# Patient Record
Sex: Female | Born: 1995
Health system: Southern US, Community
[De-identification: ages and names within clinical notes are randomized; demographics above are authoritative.]

## PROBLEM LIST (undated history)

## (undated) DIAGNOSIS — R011 Cardiac murmur, unspecified: Secondary | ICD-10-CM

## (undated) DIAGNOSIS — I509 Heart failure, unspecified: Secondary | ICD-10-CM

## (undated) DIAGNOSIS — G43909 Migraine, unspecified, not intractable, without status migrainosus: Secondary | ICD-10-CM

## (undated) DIAGNOSIS — F419 Anxiety disorder, unspecified: Secondary | ICD-10-CM

## (undated) DIAGNOSIS — Q231 Congenital insufficiency of aortic valve: Secondary | ICD-10-CM

## (undated) DIAGNOSIS — Q2381 Bicuspid aortic valve: Secondary | ICD-10-CM

## (undated) DIAGNOSIS — Q203 Discordant ventriculoarterial connection: Secondary | ICD-10-CM

## (undated) HISTORY — DX: Congenital insufficiency of aortic valve: Q23.1

## (undated) HISTORY — PX: OTHER SURGICAL HISTORY: SHX169

## (undated) HISTORY — PX: CYST EXCISION: SHX5701

## (undated) HISTORY — DX: Heart failure, unspecified: I50.9

## (undated) HISTORY — DX: Bicuspid aortic valve: Q23.81

## (undated) HISTORY — DX: Presence of prosthetic heart valve: Z95.2

## (undated) HISTORY — DX: Anxiety disorder, unspecified: F41.9

## (undated) HISTORY — DX: Cardiac murmur, unspecified: R01.1

---

## 2015-12-11 ENCOUNTER — Encounter (HOSPITAL_COMMUNITY): Payer: Self-pay | Admitting: Emergency Medicine

## 2015-12-11 ENCOUNTER — Emergency Department (HOSPITAL_COMMUNITY)
Admission: EM | Admit: 2015-12-11 | Discharge: 2015-12-11 | Disposition: A | Discharge status: Home / Self Care | Payer: 59 | Attending: Emergency Medicine | Admitting: Emergency Medicine

## 2015-12-11 DIAGNOSIS — R251 Tremor, unspecified: Secondary | ICD-10-CM | POA: Diagnosis not present

## 2015-12-11 DIAGNOSIS — M546 Pain in thoracic spine: Secondary | ICD-10-CM | POA: Diagnosis not present

## 2015-12-11 DIAGNOSIS — G43909 Migraine, unspecified, not intractable, without status migrainosus: Secondary | ICD-10-CM | POA: Diagnosis not present

## 2015-12-11 DIAGNOSIS — R51 Headache: Secondary | ICD-10-CM | POA: Diagnosis present

## 2015-12-11 DIAGNOSIS — M542 Cervicalgia: Secondary | ICD-10-CM | POA: Diagnosis not present

## 2015-12-11 DIAGNOSIS — R4789 Other speech disturbances: Secondary | ICD-10-CM | POA: Diagnosis not present

## 2015-12-11 DIAGNOSIS — G43109 Migraine with aura, not intractable, without status migrainosus: Secondary | ICD-10-CM

## 2015-12-11 HISTORY — DX: Discordant ventriculoarterial connection: Q20.3

## 2015-12-11 HISTORY — DX: Migraine, unspecified, not intractable, without status migrainosus: G43.909

## 2015-12-11 NOTE — ED Notes (Addendum)
Pt states that tonight she had L sided pain under her eye, R hand cramping, headache and neck pain. States she has a hx of migraines and is seeing 'migraine spots'. Alert and oriented. Neuro intact. Grip strengths equal. Sensation intact.

## 2015-12-11 NOTE — Discharge Instructions (Signed)
Complex (complicated)  Migraine Headache A migraine headache is an intense, throbbing pain on one or both sides of your head. A migraine can last for 30 minutes to several hours. CAUSES  The exact cause of a migraine headache is not always known. However, a migraine may be caused when nerves in the brain become irritated and release chemicals that cause inflammation. This causes pain. Certain things may also trigger migraines, such as:  Alcohol.  Smoking.  Stress.  Menstruation.  Aged cheeses.  Foods or drinks that contain nitrates, glutamate, aspartame, or tyramine.  Lack of sleep.  Chocolate.  Caffeine.  Hunger.  Physical exertion.  Fatigue.  Medicines used to treat chest pain (nitroglycerine), birth control pills, estrogen, and some blood pressure medicines. SIGNS AND SYMPTOMS  Pain on one or both sides of your head.  Pulsating or throbbing pain.  Severe pain that prevents daily activities.  Pain that is aggravated by any physical activity.  Nausea, vomiting, or both.  Dizziness.  Pain with exposure to bright lights, loud noises, or activity.  General sensitivity to bright lights, loud noises, or smells. Before you get a migraine, you may get warning signs that a migraine is coming (aura). An aura may include:  Seeing flashing lights.  Seeing bright spots, halos, or zigzag lines.  Having tunnel vision or blurred vision.  Having feelings of numbness or tingling.  Having trouble talking.  Having muscle weakness. DIAGNOSIS  A migraine headache is often diagnosed based on:  Symptoms.  Physical exam.  A CT scan or MRI of your head. These imaging tests cannot diagnose migraines, but they can help rule out other causes of headaches. TREATMENT Medicines may be given for pain and nausea. Medicines can also be given to help prevent recurrent migraines.  HOME CARE INSTRUCTIONS  Only take over-the-counter or prescription medicines for pain or discomfort  as directed by your health care provider. The use of long-term narcotics is not recommended.  Lie down in a dark, quiet room when you have a migraine.  Keep a journal to find out what may trigger your migraine headaches. For example, write down:  What you eat and drink.  How much sleep you get.  Any change to your diet or medicines.  Limit alcohol consumption.  Quit smoking if you smoke.  Get 7-9 hours of sleep, or as recommended by your health care provider.  Limit stress.  Keep lights dim if bright lights bother you and make your migraines worse. SEEK IMMEDIATE MEDICAL CARE IF:   Your migraine becomes severe.  You have a fever.  You have a stiff neck.  You have vision loss.  You have muscular weakness or loss of muscle control.  You start losing your balance or have trouble walking.  You feel faint or pass out.  You have severe symptoms that are different from your first symptoms. MAKE SURE YOU:   Understand these instructions.  Will watch your condition.  Will get help right away if you are not doing well or get worse.   This information is not intended to replace advice given to you by your health care provider. Make sure you discuss any questions you have with your health care provider.   Document Released: 09/29/2005 Document Revised: 10/20/2014 Document Reviewed: 06/06/2013 Elsevier Interactive Patient Education [employer]

## 2015-12-11 NOTE — ED Notes (Signed)
Pt ambulatory to room from triage without difficulty or assistance; steady gait; pt holding head up looking around; no obvious distress noted

## 2015-12-11 NOTE — ED Provider Notes (Signed)
CSN: [id number]     Arrival date & time 12/11/15  0033 History  By signing my name below, I, Tanda Rockers, attest that this documentation has been prepared under the direction and in the presence of Gilda Crease, MD. Electronically Signed: Tanda Rockers, ED Scribe. 12/11/2015. 3:05 AM.   Chief Complaint  Patient presents with  . Eye Pain  . Headache   The history is provided by the patient. No language interpreter was used.     HPI Comments: Gloria Hamilton is a 20 y.o. female who presents to the Emergency Department complaining of gradual onset, constant, diffuse headache that began around 7:30 PM last night (approximately 7.5 hours ago). Pt reports that prior to her headache beginning she felt bilateral eye pain. Pt also complains of neck pain, upper back pain, right hand cramping, shaking to bilateral hands, difficulty with speaking thinking and concentrating, tingling sensation to the frontal portion of her head, involuntary crying, and hyperventilating. She only currently had a headache and states that all of the other symptoms have subsided since onset. Pt began having splotchy vision while in the ED tonight. She has hx of migraines but states she has never had symptoms like this in the past. Denies weakness, numbness, or any other associated symptoms.   Past Medical History  Diagnosis Date  . Migraines   . Transposition of great vessels    Past Surgical History  Procedure Laterality Date  . Cyst excision      pilonodal  . Ross procedure     No family history on file. Social History  Substance Use Topics  . Smoking status: Never Smoker   . Smokeless tobacco: Not on file  . Alcohol Use: No   OB History    No data available     Review of Systems  Eyes: Positive for pain (bilateral) and visual disturbance (splotchy vision).  Musculoskeletal: Positive for back pain and neck pain.  Neurological: Positive for speech difficulty and headaches. Negative for weakness and  numbness.  All other systems reviewed and are negative.  Allergies  Review of patient's allergies indicates no known allergies.  Home Medications   Prior to Admission medications   Not on File   BP 103/71 mmHg  Pulse 65  Temp(Src) 98.1 F (36.7 C) (Oral)  Resp 18  SpO2 99%  LMP 11/29/2015   Physical Exam  Constitutional: She is oriented to person, place, and time. She appears well-developed and well-nourished. No distress.  HENT:  Head: Normocephalic and atraumatic.  Right Ear: Hearing normal.  Left Ear: Hearing normal.  Nose: Nose normal.  Mouth/Throat: Oropharynx is clear and moist and mucous membranes are normal.  Eyes: Conjunctivae and EOM are normal. Pupils are equal, round, and reactive to light.  Neck: Normal range of motion. Neck supple.  Cardiovascular: Regular rhythm, S1 normal and S2 normal.  Exam reveals no gallop and no friction rub.   No murmur heard. Pulmonary/Chest: Effort normal and breath sounds normal. No respiratory distress. She exhibits no tenderness.  Abdominal: Soft. Normal appearance and bowel sounds are normal. There is no hepatosplenomegaly. There is no tenderness. There is no rebound, no guarding, no tenderness at McBurney's point and negative Murphy's sign. No hernia.  Musculoskeletal: Normal range of motion.  Neurological: She is alert and oriented to person, place, and time. She has normal strength. No cranial nerve deficit or sensory deficit. Coordination normal. GCS eye subscore is 4. GCS verbal subscore is 5. GCS motor subscore is 6.  Extraocular muscle  movement: normal No visual field cut Pupils: equal and reactive both direct and consensual response is normal No nystagmus present    Sensory function is intact to light touch, pinprick Proprioception intact  Grip strength 5/5 symmetric in upper extremities No pronator drift Normal finger to nose bilaterally  Lower extremity strength 5/5 against gravity Normal heel to shin  bilaterally  Gait: normal   Skin: Skin is warm, dry and intact. No rash noted. No cyanosis.  Psychiatric: She has a normal mood and affect. Her speech is normal and behavior is normal. Thought content normal.  Nursing note and vitals reviewed.   ED Course  Procedures (including critical care time)  DIAGNOSTIC STUDIES: Oxygen Saturation is 100% on RA, normal by my interpretation.    COORDINATION OF CARE: 3:01 AM-Discussed treatment plan which includes referral to neurologist with pt at bedside and pt agreed to plan.   Labs Review Labs Reviewed - No data to display  Imaging Review No results found.   EKG Interpretation None      MDM   Final diagnoses:  Complicated migraine   Patient presents to the ER for evaluation of neurologic symptoms. Patient does have a history of classic migraines. She reports tonight, however, she had onset of strange symptoms of decreased sensation around her face and hands, difficulty speaking and thinking. These symptoms resolve when she started having migraine aura followed by onset of headache. Her headache is now improving. Although she has never had a history of complex migraine, this seems classic for complex migraine. As she is improving she does not require any workup. She has declined any migraine cocktail treatment. She does not currently have a neurologist, will refer.  I personally performed the services described in this documentation, which was scribed in my presence. The recorded information has been reviewed and is accurate.       Gilda Crease, MD 12/11/15 [phone]

## 2018-07-16 ENCOUNTER — Ambulatory Visit: Payer: Self-pay | Admitting: Obstetrics and Gynecology

## 2018-07-16 VITALS — BP 88/60 | HR 69 | Temp 98.5°F | Resp 16 | Wt 121.8 lb

## 2018-07-16 DIAGNOSIS — Z3041 Encounter for surveillance of contraceptive pills: Secondary | ICD-10-CM

## 2018-07-16 MED ORDER — NORETHINDRONE 0.35 MG PO TABS: 1.0000 | ORAL_TABLET | Freq: Every day | ORAL | 5 refills | Status: DC

## 2018-07-16 NOTE — Progress Notes (Signed)
  Subjective:     Patient ID: Gloria Hamilton, female   DOB: [birth year], 22 y.o.   MRN: [id number]  HPI  Ms.Gloria Hamilton is a 22 y.o. female here for [facility] management. She is currently taking progesterone only pill camila. She has had no issues with the medication. She would like a refill today. She plans to establish care with GYN. She had a pap 2 years ago and it was normal.    Review of Systems  Constitutional: Negative for fever.  Respiratory: Negative for chest tightness and shortness of breath.   Cardiovascular: Negative for chest pain.  Gastrointestinal: Negative for abdominal pain.  Genitourinary: Negative for vaginal discharge.   Objective:   Physical Exam  Constitutional: She is oriented to person, place, and time. She appears well-developed and well-nourished. No distress.  HENT:  Head: Normocephalic.  Eyes: Pupils are equal, round, and reactive to light.  Cardiovascular:  Murmur heard. Musculoskeletal: Normal range of motion.  Neurological: She is alert and oriented to person, place, and time.  Skin: Skin is warm. She is not diaphoretic.  Psychiatric: She has a normal mood and affect.   Assessment:   1. Encounter for surveillance of contraceptive pills   Plan:   Dc home Rx: Refill on Camila  #5 refills Discussed the importance of finding a GYN  Pregnancy test today: negative    Baleria Wyman, Harolyn Rutherford, NP 07/16/18 12:05 PM

## 2019-01-07 DIAGNOSIS — I719 Aortic aneurysm of unspecified site, without rupture: Secondary | ICD-10-CM | POA: Diagnosis not present

## 2019-01-07 DIAGNOSIS — Q231 Congenital insufficiency of aortic valve: Secondary | ICD-10-CM | POA: Diagnosis not present

## 2019-01-07 DIAGNOSIS — I771 Stricture of artery: Secondary | ICD-10-CM | POA: Diagnosis not present

## 2019-11-17 DIAGNOSIS — Z0130 Encounter for examination of blood pressure without abnormal findings: Secondary | ICD-10-CM | POA: Diagnosis not present

## 2019-12-22 DIAGNOSIS — I371 Nonrheumatic pulmonary valve insufficiency: Secondary | ICD-10-CM | POA: Diagnosis not present

## 2019-12-22 DIAGNOSIS — Z954 Presence of other heart-valve replacement: Secondary | ICD-10-CM | POA: Diagnosis not present

## 2019-12-22 DIAGNOSIS — I081 Rheumatic disorders of both mitral and tricuspid valves: Secondary | ICD-10-CM | POA: Diagnosis not present

## 2019-12-22 DIAGNOSIS — I77810 Thoracic aortic ectasia: Secondary | ICD-10-CM | POA: Diagnosis not present

## 2019-12-22 DIAGNOSIS — I719 Aortic aneurysm of unspecified site, without rupture: Secondary | ICD-10-CM | POA: Diagnosis not present

## 2019-12-22 DIAGNOSIS — I771 Stricture of artery: Secondary | ICD-10-CM | POA: Diagnosis not present

## 2019-12-22 DIAGNOSIS — Q231 Congenital insufficiency of aortic valve: Secondary | ICD-10-CM | POA: Diagnosis not present

## 2019-12-22 DIAGNOSIS — Z952 Presence of prosthetic heart valve: Secondary | ICD-10-CM | POA: Diagnosis not present

## 2019-12-22 DIAGNOSIS — J984 Other disorders of lung: Secondary | ICD-10-CM | POA: Diagnosis not present

## 2019-12-22 DIAGNOSIS — R002 Palpitations: Secondary | ICD-10-CM | POA: Diagnosis not present

## 2019-12-22 DIAGNOSIS — I351 Nonrheumatic aortic (valve) insufficiency: Secondary | ICD-10-CM | POA: Diagnosis not present

## 2019-12-22 DIAGNOSIS — Z95818 Presence of other cardiac implants and grafts: Secondary | ICD-10-CM | POA: Diagnosis not present

## 2019-12-28 DIAGNOSIS — Q231 Congenital insufficiency of aortic valve: Secondary | ICD-10-CM | POA: Diagnosis not present

## 2020-01-16 DIAGNOSIS — Z954 Presence of other heart-valve replacement: Secondary | ICD-10-CM | POA: Diagnosis not present

## 2020-01-16 DIAGNOSIS — I712 Thoracic aortic aneurysm, without rupture: Secondary | ICD-10-CM | POA: Diagnosis not present

## 2020-01-24 DIAGNOSIS — I719 Aortic aneurysm of unspecified site, without rupture: Secondary | ICD-10-CM | POA: Insufficient documentation

## 2020-01-24 DIAGNOSIS — I7121 Aneurysm of the ascending aorta, without rupture: Secondary | ICD-10-CM | POA: Insufficient documentation

## 2020-01-24 DIAGNOSIS — I712 Thoracic aortic aneurysm, without rupture: Secondary | ICD-10-CM | POA: Diagnosis not present

## 2020-01-24 DIAGNOSIS — I351 Nonrheumatic aortic (valve) insufficiency: Secondary | ICD-10-CM | POA: Diagnosis not present

## 2020-02-17 DIAGNOSIS — I712 Thoracic aortic aneurysm, without rupture: Secondary | ICD-10-CM | POA: Diagnosis not present

## 2020-02-17 DIAGNOSIS — I351 Nonrheumatic aortic (valve) insufficiency: Secondary | ICD-10-CM | POA: Diagnosis not present

## 2020-03-30 DIAGNOSIS — Z13220 Encounter for screening for lipoid disorders: Secondary | ICD-10-CM | POA: Diagnosis not present

## 2020-03-30 DIAGNOSIS — Q231 Congenital insufficiency of aortic valve: Secondary | ICD-10-CM | POA: Diagnosis not present

## 2020-03-30 DIAGNOSIS — F419 Anxiety disorder, unspecified: Secondary | ICD-10-CM | POA: Diagnosis not present

## 2020-03-30 DIAGNOSIS — Z0000 Encounter for general adult medical examination without abnormal findings: Secondary | ICD-10-CM | POA: Diagnosis not present

## 2020-05-09 DIAGNOSIS — Z952 Presence of prosthetic heart valve: Secondary | ICD-10-CM | POA: Insufficient documentation

## 2020-05-13 DIAGNOSIS — I351 Nonrheumatic aortic (valve) insufficiency: Secondary | ICD-10-CM | POA: Diagnosis not present

## 2020-05-13 DIAGNOSIS — I719 Aortic aneurysm of unspecified site, without rupture: Secondary | ICD-10-CM | POA: Diagnosis not present

## 2020-05-15 DIAGNOSIS — Z20822 Contact with and (suspected) exposure to covid-19: Secondary | ICD-10-CM | POA: Diagnosis not present

## 2020-05-16 DIAGNOSIS — Z79899 Other long term (current) drug therapy: Secondary | ICD-10-CM | POA: Diagnosis not present

## 2020-05-16 DIAGNOSIS — I712 Thoracic aortic aneurysm, without rupture: Secondary | ICD-10-CM | POA: Diagnosis not present

## 2020-05-16 DIAGNOSIS — I517 Cardiomegaly: Secondary | ICD-10-CM | POA: Diagnosis not present

## 2020-05-16 DIAGNOSIS — I719 Aortic aneurysm of unspecified site, without rupture: Secondary | ICD-10-CM | POA: Diagnosis not present

## 2020-05-16 DIAGNOSIS — R918 Other nonspecific abnormal finding of lung field: Secondary | ICD-10-CM | POA: Diagnosis not present

## 2020-05-16 DIAGNOSIS — Z9889 Other specified postprocedural states: Secondary | ICD-10-CM | POA: Diagnosis not present

## 2020-05-16 DIAGNOSIS — Q2543 Congenital aneurysm of aorta: Secondary | ICD-10-CM | POA: Diagnosis not present

## 2020-05-16 DIAGNOSIS — D6859 Other primary thrombophilia: Secondary | ICD-10-CM | POA: Diagnosis not present

## 2020-05-16 DIAGNOSIS — D649 Anemia, unspecified: Secondary | ICD-10-CM | POA: Diagnosis not present

## 2020-05-16 DIAGNOSIS — J90 Pleural effusion, not elsewhere classified: Secondary | ICD-10-CM | POA: Diagnosis not present

## 2020-05-16 DIAGNOSIS — I351 Nonrheumatic aortic (valve) insufficiency: Secondary | ICD-10-CM | POA: Insufficient documentation

## 2020-05-16 DIAGNOSIS — Z792 Long term (current) use of antibiotics: Secondary | ICD-10-CM | POA: Diagnosis not present

## 2020-05-16 DIAGNOSIS — Z793 Long term (current) use of hormonal contraceptives: Secondary | ICD-10-CM | POA: Diagnosis not present

## 2020-05-16 DIAGNOSIS — Z01810 Encounter for preprocedural cardiovascular examination: Secondary | ICD-10-CM | POA: Diagnosis not present

## 2020-05-16 DIAGNOSIS — Z954 Presence of other heart-valve replacement: Secondary | ICD-10-CM | POA: Diagnosis not present

## 2020-05-16 DIAGNOSIS — Q231 Congenital insufficiency of aortic valve: Secondary | ICD-10-CM | POA: Diagnosis not present

## 2020-05-16 DIAGNOSIS — I5032 Chronic diastolic (congestive) heart failure: Secondary | ICD-10-CM | POA: Diagnosis not present

## 2020-05-16 DIAGNOSIS — J9811 Atelectasis: Secondary | ICD-10-CM | POA: Diagnosis not present

## 2020-05-16 DIAGNOSIS — Z8679 Personal history of other diseases of the circulatory system: Secondary | ICD-10-CM | POA: Diagnosis not present

## 2020-05-16 DIAGNOSIS — D696 Thrombocytopenia, unspecified: Secondary | ICD-10-CM | POA: Diagnosis not present

## 2020-05-16 DIAGNOSIS — Z8774 Personal history of (corrected) congenital malformations of heart and circulatory system: Secondary | ICD-10-CM | POA: Diagnosis not present

## 2020-05-16 DIAGNOSIS — I5030 Unspecified diastolic (congestive) heart failure: Secondary | ICD-10-CM | POA: Insufficient documentation

## 2020-05-16 DIAGNOSIS — I420 Dilated cardiomyopathy: Secondary | ICD-10-CM | POA: Diagnosis not present

## 2020-05-17 HISTORY — PX: BENTALL PROCEDURE: SHX5058

## 2020-05-25 DIAGNOSIS — Q231 Congenital insufficiency of aortic valve: Secondary | ICD-10-CM | POA: Diagnosis not present

## 2020-05-25 DIAGNOSIS — Z952 Presence of prosthetic heart valve: Secondary | ICD-10-CM | POA: Diagnosis not present

## 2020-05-25 DIAGNOSIS — Z7901 Long term (current) use of anticoagulants: Secondary | ICD-10-CM | POA: Diagnosis not present

## 2020-05-28 DIAGNOSIS — Q231 Congenital insufficiency of aortic valve: Secondary | ICD-10-CM | POA: Diagnosis not present

## 2020-05-28 DIAGNOSIS — Z7901 Long term (current) use of anticoagulants: Secondary | ICD-10-CM | POA: Diagnosis not present

## 2020-05-29 ENCOUNTER — Telehealth (HOSPITAL_COMMUNITY): Payer: Self-pay

## 2020-05-29 NOTE — Telephone Encounter (Signed)
Outside/paper referral received from [facility]. Will fax over Physician order and request further documents. Insurance benefits and eligibility to be determined. °

## 2020-06-01 DIAGNOSIS — Q231 Congenital insufficiency of aortic valve: Secondary | ICD-10-CM | POA: Diagnosis not present

## 2020-06-01 DIAGNOSIS — Z7901 Long term (current) use of anticoagulants: Secondary | ICD-10-CM | POA: Diagnosis not present

## 2020-06-07 ENCOUNTER — Encounter (HOSPITAL_COMMUNITY): Payer: Self-pay | Admitting: *Deleted

## 2020-06-07 DIAGNOSIS — Z954 Presence of other heart-valve replacement: Secondary | ICD-10-CM | POA: Diagnosis not present

## 2020-06-07 DIAGNOSIS — I351 Nonrheumatic aortic (valve) insufficiency: Secondary | ICD-10-CM | POA: Diagnosis not present

## 2020-06-07 DIAGNOSIS — Z8679 Personal history of other diseases of the circulatory system: Secondary | ICD-10-CM | POA: Diagnosis not present

## 2020-06-07 DIAGNOSIS — Z9889 Other specified postprocedural states: Secondary | ICD-10-CM | POA: Diagnosis not present

## 2020-06-07 DIAGNOSIS — Z952 Presence of prosthetic heart valve: Secondary | ICD-10-CM | POA: Diagnosis not present

## 2020-06-07 DIAGNOSIS — J90 Pleural effusion, not elsewhere classified: Secondary | ICD-10-CM | POA: Diagnosis not present

## 2020-06-07 DIAGNOSIS — I719 Aortic aneurysm of unspecified site, without rupture: Secondary | ICD-10-CM | POA: Diagnosis not present

## 2020-06-07 NOTE — Progress Notes (Signed)
Received referral notification from Dr. Italy Hughes at [facility] for this pt to participate in cardiac rehab s/p 8/5 redo sternotomy with mechanical Bentall procedure.  Pt completed her follow up appt today in the Thoracic clinic.  Pt is doing well postoperatively.  Referral form and request for recent 12 lead ekg sent for completion. Unclear of pt insurance status and residence.  Will have support staff contact pt for interest in participating in CR, Insurance benefits and confirmation pt lives in [place]. Will have support staff contact pt.  Alanson Aly, BSN Cardiac and Emergency planning/management officer

## 2020-06-08 ENCOUNTER — Telehealth (HOSPITAL_COMMUNITY): Payer: Self-pay

## 2020-06-08 NOTE — Telephone Encounter (Signed)
Called and spoke with pt in regards to CR, pt stated she is interested. Will contact pt once we have recv'ed her ppw back from dr office. Went over insurance, patient verbalized understanding.   Placed ppw in fax awaiting folder.

## 2020-06-14 ENCOUNTER — Telehealth (HOSPITAL_COMMUNITY): Payer: Self-pay

## 2020-06-14 ENCOUNTER — Telehealth (HOSPITAL_COMMUNITY): Payer: Self-pay | Admitting: Physician Assistant

## 2020-06-14 DIAGNOSIS — Q231 Congenital insufficiency of aortic valve: Secondary | ICD-10-CM | POA: Diagnosis not present

## 2020-06-14 DIAGNOSIS — Z7901 Long term (current) use of anticoagulants: Secondary | ICD-10-CM | POA: Diagnosis not present

## 2020-06-14 NOTE — Telephone Encounter (Signed)
Returned pt phone call, adv her we are still waiting on ppw from Dr. Kizzie Bane office. Explained scheduling process and went over insurance, patient verbalized understanding.

## 2020-06-21 DIAGNOSIS — Q231 Congenital insufficiency of aortic valve: Secondary | ICD-10-CM | POA: Diagnosis not present

## 2020-06-21 DIAGNOSIS — Z7901 Long term (current) use of anticoagulants: Secondary | ICD-10-CM | POA: Diagnosis not present

## 2020-06-26 ENCOUNTER — Telehealth (HOSPITAL_COMMUNITY): Payer: Self-pay

## 2020-06-26 NOTE — Telephone Encounter (Signed)
Pt called and stated her doctor office would like to have ppw refax to [phone].

## 2020-06-27 ENCOUNTER — Telehealth (HOSPITAL_COMMUNITY): Payer: Self-pay

## 2020-06-27 NOTE — Telephone Encounter (Signed)
Called patient to see if she was interested in participating in the Cardiac Rehab Program. Patient stated yes. Patient will come in for orientation on 07/10/20 @ 130PM and will attend the 11:00AM exercise class.  Mailed letter

## 2020-06-29 DIAGNOSIS — Q231 Congenital insufficiency of aortic valve: Secondary | ICD-10-CM | POA: Diagnosis not present

## 2020-06-29 DIAGNOSIS — Z7901 Long term (current) use of anticoagulants: Secondary | ICD-10-CM | POA: Diagnosis not present

## 2020-07-04 ENCOUNTER — Encounter (HOSPITAL_COMMUNITY)
Admission: RE | Admit: 2020-07-04 | Discharge: 2020-07-04 | Disposition: A | Discharge status: Home / Self Care | Payer: BC Managed Care – PPO | Source: Ambulatory Visit | Attending: Cardiology | Admitting: Cardiology

## 2020-07-04 ENCOUNTER — Other Ambulatory Visit: Payer: Self-pay

## 2020-07-04 DIAGNOSIS — Z952 Presence of prosthetic heart valve: Secondary | ICD-10-CM | POA: Insufficient documentation

## 2020-07-04 NOTE — Progress Notes (Signed)
Cardiac Rehab Telephone Note:  Successful telephone encounter to Gloria Hamilton to confirm Cardiac Rehab orientation appointment for 07/10/20 at 1:30 pm. Nursing assessment completed. Patient questions answered. Instructions for appointment provided. Patient screening for Covid-19 negative.  Larie Mathes E. Suzie Portela RN, BSN [place]  Cardiac and Pulmonary Rehabilitation Phone: [phone] Fax: [phone]

## 2020-07-05 ENCOUNTER — Telehealth (HOSPITAL_COMMUNITY): Payer: Self-pay | Admitting: Pharmacist

## 2020-07-05 NOTE — Telephone Encounter (Signed)
Cardiac Rehab Medication Review by a Pharmacist  Does the patient  feel that his/her medications are working for him/her?  yes  Has the patient been experiencing any side effects to the medications prescribed?  No, not from medications currently but reports repeated low blood pressures causing her to feel dizzy sometimes.  Does the patient measure his/her own blood pressure or blood glucose at home?  Yes, most recent BP was 98/70.  Does the patient have any problems obtaining medications due to transportation or finances?   no  Understanding of regimen: good Understanding of indications: fair Potential of compliance: good   Pharmacist Intervention: Counseled patient on using caution with moving from laying or sitting to standing given dizziness and low blood pressure at baseline.   Laverna Peace, PharmD PGY-1 Pharmacy Resident 07/05/2020 5:27 PM Please see [facility] for all pharmacy numbers

## 2020-07-06 DIAGNOSIS — Q231 Congenital insufficiency of aortic valve: Secondary | ICD-10-CM | POA: Diagnosis not present

## 2020-07-06 DIAGNOSIS — Z7901 Long term (current) use of anticoagulants: Secondary | ICD-10-CM | POA: Diagnosis not present

## 2020-07-10 ENCOUNTER — Telehealth (HOSPITAL_COMMUNITY): Payer: Self-pay | Admitting: Physician Assistant

## 2020-07-10 ENCOUNTER — Ambulatory Visit (HOSPITAL_COMMUNITY): Payer: BC Managed Care – PPO

## 2020-07-10 DIAGNOSIS — R000 Tachycardia, unspecified: Secondary | ICD-10-CM | POA: Diagnosis not present

## 2020-07-10 DIAGNOSIS — Z881 Allergy status to other antibiotic agents status: Secondary | ICD-10-CM | POA: Diagnosis not present

## 2020-07-10 DIAGNOSIS — N390 Urinary tract infection, site not specified: Secondary | ICD-10-CM | POA: Diagnosis not present

## 2020-07-10 DIAGNOSIS — Z20822 Contact with and (suspected) exposure to covid-19: Secondary | ICD-10-CM | POA: Diagnosis not present

## 2020-07-10 DIAGNOSIS — I372 Nonrheumatic pulmonary valve stenosis with insufficiency: Secondary | ICD-10-CM | POA: Diagnosis not present

## 2020-07-10 DIAGNOSIS — R109 Unspecified abdominal pain: Secondary | ICD-10-CM | POA: Diagnosis not present

## 2020-07-10 DIAGNOSIS — I712 Thoracic aortic aneurysm, without rupture: Secondary | ICD-10-CM | POA: Diagnosis not present

## 2020-07-10 DIAGNOSIS — E871 Hypo-osmolality and hyponatremia: Secondary | ICD-10-CM | POA: Diagnosis not present

## 2020-07-10 DIAGNOSIS — E876 Hypokalemia: Secondary | ICD-10-CM | POA: Diagnosis not present

## 2020-07-10 DIAGNOSIS — Z952 Presence of prosthetic heart valve: Secondary | ICD-10-CM | POA: Diagnosis not present

## 2020-07-10 DIAGNOSIS — Z7982 Long term (current) use of aspirin: Secondary | ICD-10-CM | POA: Diagnosis not present

## 2020-07-10 DIAGNOSIS — T83511A Infection and inflammatory reaction due to indwelling urethral catheter, initial encounter: Secondary | ICD-10-CM | POA: Diagnosis not present

## 2020-07-10 DIAGNOSIS — R509 Fever, unspecified: Secondary | ICD-10-CM | POA: Diagnosis not present

## 2020-07-10 DIAGNOSIS — B9620 Unspecified Escherichia coli [E. coli] as the cause of diseases classified elsewhere: Secondary | ICD-10-CM | POA: Diagnosis not present

## 2020-07-10 DIAGNOSIS — T83518A Infection and inflammatory reaction due to other urinary catheter, initial encounter: Secondary | ICD-10-CM | POA: Diagnosis not present

## 2020-07-10 DIAGNOSIS — Q231 Congenital insufficiency of aortic valve: Secondary | ICD-10-CM | POA: Diagnosis not present

## 2020-07-10 DIAGNOSIS — R0602 Shortness of breath: Secondary | ICD-10-CM | POA: Diagnosis not present

## 2020-07-10 DIAGNOSIS — Y846 Urinary catheterization as the cause of abnormal reaction of the patient, or of later complication, without mention of misadventure at the time of the procedure: Secondary | ICD-10-CM | POA: Diagnosis not present

## 2020-07-10 DIAGNOSIS — Z7901 Long term (current) use of anticoagulants: Secondary | ICD-10-CM | POA: Diagnosis not present

## 2020-07-13 DIAGNOSIS — Q231 Congenital insufficiency of aortic valve: Secondary | ICD-10-CM | POA: Insufficient documentation

## 2020-07-16 ENCOUNTER — Ambulatory Visit (HOSPITAL_COMMUNITY): Payer: BC Managed Care – PPO

## 2020-07-18 ENCOUNTER — Ambulatory Visit (HOSPITAL_COMMUNITY): Payer: BC Managed Care – PPO

## 2020-07-18 DIAGNOSIS — Q231 Congenital insufficiency of aortic valve: Secondary | ICD-10-CM | POA: Diagnosis not present

## 2020-07-18 DIAGNOSIS — Z7901 Long term (current) use of anticoagulants: Secondary | ICD-10-CM | POA: Diagnosis not present

## 2020-07-20 ENCOUNTER — Ambulatory Visit (HOSPITAL_COMMUNITY): Payer: BC Managed Care – PPO

## 2020-07-23 ENCOUNTER — Ambulatory Visit (HOSPITAL_COMMUNITY): Payer: BC Managed Care – PPO

## 2020-07-25 ENCOUNTER — Ambulatory Visit (HOSPITAL_COMMUNITY): Payer: BC Managed Care – PPO

## 2020-07-25 DIAGNOSIS — Z952 Presence of prosthetic heart valve: Secondary | ICD-10-CM | POA: Diagnosis not present

## 2020-07-25 DIAGNOSIS — Z7901 Long term (current) use of anticoagulants: Secondary | ICD-10-CM | POA: Diagnosis not present

## 2020-07-27 ENCOUNTER — Ambulatory Visit (HOSPITAL_COMMUNITY): Payer: BC Managed Care – PPO

## 2020-07-30 ENCOUNTER — Ambulatory Visit (HOSPITAL_COMMUNITY): Payer: BC Managed Care – PPO

## 2020-08-01 ENCOUNTER — Ambulatory Visit (HOSPITAL_COMMUNITY): Payer: BC Managed Care – PPO

## 2020-08-03 ENCOUNTER — Ambulatory Visit (HOSPITAL_COMMUNITY): Payer: BC Managed Care – PPO

## 2020-08-06 ENCOUNTER — Telehealth (HOSPITAL_COMMUNITY): Payer: Self-pay

## 2020-08-06 ENCOUNTER — Ambulatory Visit (HOSPITAL_COMMUNITY): Payer: BC Managed Care – PPO

## 2020-08-06 DIAGNOSIS — Z952 Presence of prosthetic heart valve: Secondary | ICD-10-CM | POA: Diagnosis not present

## 2020-08-06 DIAGNOSIS — Z7901 Long term (current) use of anticoagulants: Secondary | ICD-10-CM | POA: Diagnosis not present

## 2020-08-08 ENCOUNTER — Ambulatory Visit (HOSPITAL_COMMUNITY): Payer: BC Managed Care – PPO

## 2020-08-09 ENCOUNTER — Encounter (HOSPITAL_COMMUNITY): Payer: Self-pay

## 2020-08-09 ENCOUNTER — Encounter (HOSPITAL_COMMUNITY)
Admission: RE | Admit: 2020-08-09 | Discharge: 2020-08-09 | Disposition: A | Discharge status: Home / Self Care | Payer: BC Managed Care – PPO | Source: Ambulatory Visit | Attending: Cardiology | Admitting: Cardiology

## 2020-08-09 ENCOUNTER — Other Ambulatory Visit: Payer: Self-pay

## 2020-08-09 VITALS — BP 113/77 | Temp 98.0°F | Ht 63.75 in | Wt 125.0 lb

## 2020-08-09 DIAGNOSIS — Z952 Presence of prosthetic heart valve: Secondary | ICD-10-CM | POA: Insufficient documentation

## 2020-08-09 NOTE — Progress Notes (Signed)
Cardiac Individual Treatment Plan  Patient Details  Name: Gloria Hamilton MRN: [id number] Date of Birth: 06/27/1996 Referring Provider:     CARDIAC REHAB PHASE II ORIENTATION from 08/09/2020 in [facility] CARDIAC [facility]  Referring Provider Dr Kizzie Bane at [facility]/ DR Armanda Magic Covering      Initial Encounter Date:    CARDIAC REHAB PHASE II ORIENTATION from 08/09/2020 in [facility] CARDIAC REHAB  Date 08/09/20      Visit Diagnosis: S/P redo Sternotomy and mechanical Bentall Procedure 05/17/20  Patient's Home Medications on Admission:  Current Outpatient Medications:  .  aspirin 81 MG chewable tablet, Chew 81 mg by mouth daily., Disp: , Rfl:  .  norethindrone (CAMILA) 0.35 MG tablet, Take 1 tablet (0.35 mg total) by mouth daily., Disp: 1 Package, Rfl: 5 .  polyethylene glycol powder (GLYCOLAX/MIRALAX) 17 GM/SCOOP powder, Take 17 g by mouth daily as needed., Disp: , Rfl:  .  senna-docusate (SENOKOT-S) 8.6-50 MG tablet, Take 1 tablet by mouth daily as needed., Disp: , Rfl:  .  warfarin (COUMADIN) 7.5 MG tablet, Take 7.5 mg by mouth daily. , Disp: , Rfl:   Past Medical History: Past Medical History:  Diagnosis Date  . Migraines   . Transposition of great vessels     Tobacco Use: Social History   Tobacco Use  Smoking Status Never Smoker  Smokeless Tobacco Never Used    Labs: Recent Review Flowsheet Data   There is no flowsheet data to display.     Capillary Blood Glucose: No results found for: GLUCAP   Exercise Target Goals: Exercise Program Goal: Individual exercise prescription set using results from initial 6 min walk test and THRR while considering  patient's activity barriers and safety.   Exercise Prescription Goal: Starting with aerobic activity 30 plus minutes a day, 3 days per week for initial exercise prescription. Provide home exercise prescription and guidelines that participant acknowledges understanding prior to  discharge.  Activity Barriers & Risk Stratification:  Activity Barriers & Cardiac Risk Stratification - 08/09/20 1113      Activity Barriers & Cardiac Risk Stratification   Activity Barriers Other (comment)    Comments Dizziness at times    Cardiac Risk Stratification Moderate           6 Minute Walk:  6 Minute Walk    Row Name 08/09/20 1010         6 Minute Walk   Phase Initial     Distance 1532 feet     Walk Time 6 minutes     # of Rest Breaks 0     MPH 2.9     METS 6.1     RPE 11     Perceived Dyspnea  0     VO2 Peak 21.4     Symptoms No     Resting HR 108 bpm     Resting BP 113/70  Automatic BP cuff     Resting Oxygen Saturation  98 %     Exercise Oxygen Saturation  during 6 min walk 97 %     Max Ex. HR 130 bpm     Max Ex. BP 108/78  Automatic BP CUff     2 Minute Post BP 108/76  Automatic BP cuff            Oxygen Initial Assessment:   Oxygen Re-Evaluation:   Oxygen Discharge (Final Oxygen Re-Evaluation):   Initial Exercise Prescription:  Initial Exercise Prescription - 08/09/20 1100  Date of Initial Exercise RX and Referring Provider   Date 08/09/20    Referring Provider Dr Kizzie Bane at [facility]/ DR Armanda Magic Covering    Expected Discharge Date 10/03/20      Treadmill   MPH 3    Grade 1    Minutes 15    METs 3.37      Bike   Level 1.5    Minutes 15    METs 4      Prescription Details   Frequency (times per week) 3    Duration Progress to 30 minutes of continuous aerobic without signs/symptoms of physical distress      Intensity   THRR 40-80% of Max Heartrate 78-157    Ratings of Perceived Exertion 11-13    Perceived Dyspnea 0-4      Progression   Progression Continue progressive overload as per policy without signs/symptoms or physical distress.      Resistance Training   Training Prescription Yes    Weight 2 lbs    Reps 10-15           Perform Capillary Blood Glucose checks as needed.  Exercise Prescription  Changes:   Exercise Comments:   Exercise Goals and Review:   Exercise Goals    Row Name 08/09/20 1116             Exercise Goals   Increase Physical Activity Yes       Intervention Provide advice, education, support and counseling about physical activity/exercise needs.;Develop an individualized exercise prescription for aerobic and resistive training based on initial evaluation findings, risk stratification, comorbidities and participant's personal goals.       Expected Outcomes Short Term: Attend rehab on a regular basis to increase amount of physical activity.;Long Term: Add in home exercise to make exercise part of routine and to increase amount of physical activity.;Long Term: Exercising regularly at least 3-5 days a week.       Increase Strength and Stamina Yes       Intervention Provide advice, education, support and counseling about physical activity/exercise needs.;Develop an individualized exercise prescription for aerobic and resistive training based on initial evaluation findings, risk stratification, comorbidities and participant's personal goals.       Expected Outcomes Short Term: Increase workloads from initial exercise prescription for resistance, speed, and METs.;Short Term: Perform resistance training exercises routinely during rehab and add in resistance training at home;Long Term: Improve cardiorespiratory fitness, muscular endurance and strength as measured by increased METs and functional capacity ( )       Able to understand and use rate of perceived exertion (RPE) scale Yes       Intervention Provide education and explanation on how to use RPE scale       Expected Outcomes Short Term: Able to use RPE daily in rehab to express subjective intensity level;Long Term:  Able to use RPE to guide intensity level when exercising independently       Knowledge and understanding of Target Heart Rate Range (THRR) Yes       Intervention Provide education and explanation of  THRR including how the numbers were predicted and where they are located for reference       Expected Outcomes Short Term: Able to state/look up THRR;Short Term: Able to use daily as guideline for intensity in rehab;Long Term: Able to use THRR to govern intensity when exercising independently       Able to check pulse independently Yes       Intervention Provide  education and demonstration on how to check pulse in carotid and radial arteries.;Review the importance of being able to check your own pulse for safety during independent exercise       Expected Outcomes Short Term: Able to explain why pulse checking is important during independent exercise;Long Term: Able to check pulse independently and accurately       Understanding of Exercise Prescription Yes       Intervention Provide education, explanation, and written materials on patient's individual exercise prescription       Expected Outcomes Short Term: Able to explain program exercise prescription;Long Term: Able to explain home exercise prescription to exercise independently              Exercise Goals Re-Evaluation :    Discharge Exercise Prescription (Final Exercise Prescription Changes):   Nutrition:  Target Goals: Understanding of nutrition guidelines, daily intake of sodium 1500mg , cholesterol 200mg , calories 30% from fat and 7% or less from saturated fats, daily to have 5 or more servings of fruits and vegetables.  Biometrics:  Pre Biometrics - 08/09/20 0930      Pre Biometrics   Waist Circumference 29.5 inches    Hip Circumference 37 inches    Waist to Hip Ratio 0.8 %    Triceps Skinfold 13 mm    % Body Fat 26.7 %    Grip Strength 25 kg    Flexibility 10 in    Single Leg Stand 30 seconds            Nutrition Therapy Plan and Nutrition Goals:   Nutrition Assessments:   Nutrition Goals Re-Evaluation:   Nutrition Goals Discharge (Final Nutrition Goals Re-Evaluation):   Psychosocial: Target Goals:  Acknowledge presence or absence of significant depression and/or stress, maximize coping skills, provide positive support system. Participant is able to verbalize types and ability to use techniques and skills needed for reducing stress and depression.  Initial Review & Psychosocial Screening:  Initial Psych Review & Screening - 08/09/20 1400      Initial Review   Current issues with Current Stress Concerns    Source of Stress Concerns Chronic Illness    Comments Cartier has some stress concerns due to her recent open heart surgery and recent hospitalization for a recent UTi      Family Dynamics   Good Support System? Yes   Ericah has her boyfriend for support and her mother who lives out of town     Barriers   Psychosocial barriers to participate in program The patient should benefit from training in stress management and relaxation.      Screening Interventions   Interventions Encouraged to exercise    Expected Outcomes Long Term Goal: Stressors or current issues are controlled or eliminated.;Short Term goal: Identification and review with participant of any Quality of Life or Depression concerns found by scoring the questionnaire.;Long Term goal: The participant improves quality of Life and PHQ9 Scores as seen by post scores and/or verbalization of changes           Quality of Life Scores:  Quality of Life - 08/09/20 1251      Quality of Life   Select Quality of Life      Quality of Life Scores   Health/Function Pre 20.67 %    Socioeconomic Pre 26.5 %    Psych/Spiritual Pre 17.57 %    Family Pre 22.5 %    GLOBAL Pre 21.47 %          Scores of  19 and below usually indicate a poorer quality of life in these areas.  A difference of  2-3 points is a clinically meaningful difference.  A difference of 2-3 points in the total score of the Quality of Life Index has been associated with significant improvement in overall quality of life, self-image, physical symptoms, and general  health in studies assessing change in quality of life.  PHQ-9: Recent Review Flowsheet Data    Depression screen [facility] 2/9 08/09/2020   Decreased Interest 0   Down, Depressed, Hopeless 0   PHQ - 2 Score 0     Interpretation of Total Score  Total Score Depression Severity:  1-4 = Minimal depression, 5-9 = Mild depression, 10-14 = Moderate depression, 15-19 = Moderately severe depression, 20-27 = Severe depression   Psychosocial Evaluation and Intervention:   Psychosocial Re-Evaluation:   Psychosocial Discharge (Final Psychosocial Re-Evaluation):   Vocational Rehabilitation: Provide vocational rehab assistance to qualifying candidates.   Vocational Rehab Evaluation & Intervention:  Vocational Rehab - 08/09/20 1402      Initial Vocational Rehab Evaluation & Intervention   Assessment shows need for Vocational Rehabilitation No   Rejoice just retruned back to work at the [employer] and does not need vocational rehab at this time.          Education: Education Goals: Education classes will be provided on a weekly basis, covering required topics. Participant will state understanding/return demonstration of topics presented.  Learning Barriers/Preferences:  Learning Barriers/Preferences - 08/09/20 1103      Learning Barriers/Preferences   Learning Barriers None    Learning Preferences None           Education Topics: Hypertension, Hypertension Reduction -Define heart disease and high blood pressure. Discus how high blood pressure affects the body and ways to reduce high blood pressure.   Exercise and Your Heart -Discuss why it is important to exercise, the FITT principles of exercise, normal and abnormal responses to exercise, and how to exercise safely.   Angina -Discuss definition of angina, causes of angina, treatment of angina, and how to decrease risk of having angina.   Cardiac Medications -Review what the following cardiac medications are  used for, how they affect the body, and side effects that may occur when taking the medications.  Medications include Aspirin, Beta blockers, calcium channel blockers, ACE Inhibitors, angiotensin receptor blockers, diuretics, digoxin, and antihyperlipidemics.   Congestive Heart Failure -Discuss the definition of CHF, how to live with CHF, the signs and symptoms of CHF, and how keep track of weight and sodium intake.   Heart Disease and Intimacy -Discus the effect sexual activity has on the heart, how changes occur during intimacy as we age, and safety during sexual activity.   Smoking Cessation / COPD -Discuss different methods to quit smoking, the health benefits of quitting smoking, and the definition of COPD.   Nutrition I: Fats -Discuss the types of cholesterol, what cholesterol does to the heart, and how cholesterol levels can be controlled.   Nutrition II: Labels -Discuss the different components of food labels and how to read food label   Heart Parts/Heart Disease and PAD -Discuss the anatomy of the heart, the pathway of blood circulation through the heart, and these are affected by heart disease.   Stress I: Signs and Symptoms -Discuss the causes of stress, how stress may lead to anxiety and depression, and ways to limit stress.   Stress II: Relaxation -Discuss different types of relaxation techniques to limit stress.  Warning Signs of Stroke / TIA -Discuss definition of a stroke, what the signs and symptoms are of a stroke, and how to identify when someone is having stroke.   Knowledge Questionnaire Score:  Knowledge Questionnaire Score - 08/09/20 1101      Knowledge Questionnaire Score   Pre Score 21/24           Core Components/Risk Factors/Patient Goals at Admission:  Personal Goals and Risk Factors at Admission - 08/09/20 1403      Core Components/Risk Factors/Patient Goals on Admission    Weight Management Weight Maintenance;Yes    Intervention  Weight Management: Develop a combined nutrition and exercise program designed to reach desired caloric intake, while maintaining appropriate intake of nutrient and fiber, sodium and fats, and appropriate energy expenditure required for the weight goal.;Weight Management: Provide education and appropriate resources to help participant work on and attain dietary goals.    Expected Outcomes Weight Maintenance: Understanding of the daily nutrition guidelines, which includes 25-35% calories from fat, 7% or less cal from saturated fats, less than 200mg  cholesterol, less than 1.5gm of sodium, & 5 or more servings of fruits and vegetables daily;Understanding recommendations for meals to include 15-35% energy as protein, 25-35% energy from fat, 35-60% energy from carbohydrates, less than 200mg  of dietary cholesterol, 20-35 gm of total fiber daily;Understanding of distribution of calorie intake throughout the day with the consumption of 4-5 meals/snacks           Core Components/Risk Factors/Patient Goals Review:    Core Components/Risk Factors/Patient Goals at Discharge (Final Review):    ITP Comments:  ITP Comments    Row Name 08/09/20 1347           ITP Comments Dr Armanda Magicraci Turner MD, Medical Director              Comments:Jasmeen attended orientation on 08/09/2020 to review rules and guidelines for program.  Completed 6 minute walk test, Intitial ITP, and exercise prescription.  VSS. Telemetry-Sinus Tach please see previous documentation.  Asymptomatic. Safety measures and social distancing in place per CDC guidelines.Gladstone LighterMaria Shenelle Klas, RN,BSN 08/09/2020 2:09 PM

## 2020-08-09 NOTE — Progress Notes (Signed)
Patient here for cardiac rehab orientation. Telemetry rhythm Sinus Tach 108 lowest noted. Blood pressure 113/77.  Max heart rate noted at 134. Oxygen saturation 98% on room air. Temperature 98.1. Gloria Hamilton tolerated today's walk test without symptoms or complaints. Dr Kizzie Bane office called and notified about resting sinus tach. Gloria Hamilton said that her beta blocker was discontinued due to hypotension. Will send today's ECG tracings vital signs to Dr Kizzie Bane office for review and get parameters for resting heart rate.Gloria Lighter, RN,BSN 08/09/2020 1:58 PM

## 2020-08-10 ENCOUNTER — Telehealth (HOSPITAL_COMMUNITY): Payer: Self-pay | Admitting: *Deleted

## 2020-08-10 ENCOUNTER — Ambulatory Visit (HOSPITAL_COMMUNITY): Payer: BC Managed Care – PPO

## 2020-08-10 NOTE — Telephone Encounter (Signed)
Attempted to call [facility] regarding exercise at cardiac rehab. Unable to leave message. I called patient's mother Tammy and asked that she call cardiac rehab.Gladstone Lighter, RN,BSN 08/10/2020 1:46 PM

## 2020-08-10 NOTE — Telephone Encounter (Signed)
Spoke with the patient. It was recommended by Gloria Loan NP that Gloria Hamilton follow up with cardiology regarding her sinus tachycardia. I asked Gloria Hamilton to hold off on starting exercise on Monday. Gloria Hamilton said that she was disappointed that she cant start exercise on Monday. Will follow up with the patient next week.Gladstone Lighter, RN,BSN 08/10/2020 2:39 PM

## 2020-08-12 ENCOUNTER — Other Ambulatory Visit: Payer: Self-pay

## 2020-08-12 ENCOUNTER — Encounter (HOSPITAL_COMMUNITY): Payer: Self-pay

## 2020-08-12 ENCOUNTER — Ambulatory Visit (HOSPITAL_COMMUNITY)
Admission: EM | Admit: 2020-08-12 | Discharge: 2020-08-12 | Disposition: A | Discharge status: Home / Self Care | Payer: BC Managed Care – PPO | Attending: Family Medicine | Admitting: Family Medicine

## 2020-08-12 DIAGNOSIS — L03011 Cellulitis of right finger: Secondary | ICD-10-CM

## 2020-08-12 DIAGNOSIS — Z7901 Long term (current) use of anticoagulants: Secondary | ICD-10-CM

## 2020-08-12 MED ORDER — AMOXICILLIN-POT CLAVULANATE 875-125 MG PO TABS: 1.0000 | ORAL_TABLET | Freq: Two times a day (BID) | ORAL | 0 refills | Status: AC

## 2020-08-12 NOTE — ED Provider Notes (Signed)
[facility]   [id number] 08/12/20 Arrival Time: 1008  CC: RASH  SUBJECTIVE:  Gloria Hamilton is a 24 y.o. female who presents with a skin complaint that began about 2 days ago. Had a hangnail that she cut and now the area surrounding the nail is red, swollen, painful to touch. Denies drainage. Has hx of transposition of great vessels and open heart surgery. Taking 7.5mg  warfarin daily. Denies previous symptoms.  Has not attempted OTC treatment. Denies similar symptoms in the past. Denies fever, chills, nausea, vomiting, discharge, oral lesions, SOB, chest pain, abdominal pain, changes in bowel or bladder function.    ROS: As per HPI.  All other pertinent ROS negative.     Past Medical History:  Diagnosis Date  . Migraines   . Transposition of great vessels    Past Surgical History:  Procedure Laterality Date  . BENTALL PROCEDURE  05/17/2020   S/P Aortic Aneurysm repair , Redo thoracotomy at [facility] Dr Kizzie Bane  . CYST EXCISION     pilonodal  . Ross Procedure     Allergies  Allergen Reactions  . Ceftriaxone Rash    Lower extremity edema  . Ciprofloxacin Other (See Comments)    "symptoms more elevated so tries to avoid"   . Lisinopril Cough   No current facility-administered medications on file prior to encounter.   Current Outpatient Medications on File Prior to Encounter  Medication Sig Dispense Refill  . aspirin 81 MG chewable tablet Chew 81 mg by mouth daily.    . norethindrone (CAMILA) 0.35 MG tablet Take 1 tablet (0.35 mg total) by mouth daily. 1 Package 5  . polyethylene glycol powder (GLYCOLAX/MIRALAX) 17 GM/SCOOP powder Take 17 g by mouth daily as needed.    . senna-docusate (SENOKOT-S) 8.6-50 MG tablet Take 1 tablet by mouth daily as needed.    . warfarin (COUMADIN) 7.5 MG tablet Take 7.5 mg by mouth daily.      Social History   Socioeconomic History  . Marital status: Single    Spouse name: Not on file  . Number of children: Not on file  . Years of  education: Not on file  . Highest education level: Not on file  Occupational History  . Not on file  Tobacco Use  . Smoking status: Never Smoker  . Smokeless tobacco: Never Used  Substance and Sexual Activity  . Alcohol use: No  . Drug use: No  . Sexual activity: Not on file  Other Topics Concern  . Not on file  Social History Narrative  . Not on file   Social Determinants of Health   Financial Resource Strain:   . Difficulty of Paying Living Expenses: Not on file  Food Insecurity:   . Worried About Programme researcher, broadcasting/film/video in the Last Year: Not on file  . Ran Out of Food in the Last Year: Not on file  Transportation Needs:   . Lack of Transportation (Medical): Not on file  . Lack of Transportation (Non-Medical): Not on file  Physical Activity:   . Days of Exercise per Week: Not on file  . Minutes of Exercise per Session: Not on file  Stress:   . Feeling of Stress : Not on file  Social Connections:   . Frequency of Communication with Friends and Family: Not on file  . Frequency of Social Gatherings with Friends and Family: Not on file  . Attends Religious Services: Not on file  . Active Member of Clubs or Organizations: Not on file  .  Attends Banker Meetings: Not on file  . Marital Status: Not on file  Intimate Partner Violence:   . Fear of Current or Ex-Partner: Not on file  . Emotionally Abused: Not on file  . Physically Abused: Not on file  . Sexually Abused: Not on file   History reviewed. No pertinent family history.  OBJECTIVE: Vitals:   08/12/20 1033  BP: 99/77  Pulse: (!) 113  Resp: 16  Temp: 98.4 F (36.9 C)  TempSrc: Oral  SpO2: 99%    General appearance: alert; no distress Head: NCAT Lungs: clear to auscultation bilaterally Heart: regular rate and rhythm.  Radial pulse 2+ bilaterally Extremities: no edema Skin: warm and dry; paronychia to R index fingernail, erythematous, swollen, warm, tender to touch Psychological: alert and  cooperative; normal mood and affect  ASSESSMENT & PLAN:  1. Paronychia of finger, right   2. Long term current use of anticoagulant therapy     Meds ordered this encounter  Medications  . amoxicillin-clavulanate (AUGMENTIN) 875-125 MG tablet    Sig: Take 1 tablet by mouth 2 (two) times daily for 10 days.    Dispense:  20 tablet    Refill:  0    Order Specific Question:   Supervising Provider    Answer:   Merrilee Jansky [[id number]]    Augmentin prescribed Pay attention to any new bruising, petechiae Augmentin has a low likelihood of increasing warfarin effects, but it is always best to be cautios Take as prescribed and to completion Avoid hot showers/ baths Moisturize skin daily  Follow up with PCP if symptoms persists Return or go to the ER if you have any new or worsening symptoms such as fever, chills, nausea, vomiting, redness, swelling, discharge, if symptoms do not improve with medications  Reviewed expectations re: course of current medical issues. Questions answered. Outlined signs and symptoms indicating need for more acute intervention. Patient verbalized understanding. After Visit Summary given.   Moshe Cipro, NP 08/12/20 1108

## 2020-08-12 NOTE — Discharge Instructions (Signed)
I have sent in Augmentin for you to take twice a day for 7 days.  Follow up with this office or with primary care if symptoms are persisting.  Follow up in the ER for high fever, trouble swallowing, trouble breathing, other concerning symptoms.  

## 2020-08-12 NOTE — ED Triage Notes (Signed)
Pt present ingrown finger nail that she attempted to cut it off but the skin around the ingrown finger begin to get hard with swelling and red.  Pt states its painful to touch.

## 2020-08-13 ENCOUNTER — Telehealth (HOSPITAL_COMMUNITY): Payer: Self-pay

## 2020-08-13 ENCOUNTER — Ambulatory Visit (HOSPITAL_COMMUNITY): Payer: BC Managed Care – PPO

## 2020-08-13 DIAGNOSIS — R000 Tachycardia, unspecified: Secondary | ICD-10-CM | POA: Diagnosis not present

## 2020-08-13 NOTE — Telephone Encounter (Signed)
Cardiac Rehab Note:  Successful telephone call to [facility] Sanes to follow up on need for cardiologist contact information in [place]. Ms. Buckels states she had a telephone conversation with Darlis Loan, CVTS NP this morning who prefers patient has work up with PCP for tachycardia. NP has also referred patient to [facility] Cardiologist as patient does not wish to continue to drive to [facility] to see current cardiologist. Patient does not wish for this RN to contact [facility] Cardiologist for Cardiac Rehab HR parameters. Patient states she will discuss with PCP and will follow up with CR RN as appropriate. Patient will be placed on hold from CR until follow up. Will continue to monitor and if patient is unable to obtain appt in near future will discuss with patient option for discharge until follow up and HR parameters can be obtained.  Laryah Neuser E. Suzie Portela RN, BSN [place]. [facility]  Cardiac and Pulmonary Rehabilitation Phone: [phone] Fax: [phone]

## 2020-08-13 NOTE — Telephone Encounter (Signed)
Incoming telephone call from Darlis Loan, CVTS NP to discuss Gloria Hamilton's HR parameters for cardiac rehab. It is Ms. Gloria Hamilton suggestion that Gloria Hamilton have a follow up with her Cardiologist or PCP to explore causitive factor for resting tachycardia. Ms. Gloria Hamilton feels [name] is "safe" to begin cardiac rehab exercise however would like for patients cardiologist to provide resting HR parameters.  Plan: Will contact patients primary cardiologist for HR parameters  Ryo Klang E. Suzie Portela RN, BSN [place]. [facility]  Cardiac and Pulmonary Rehabilitation Phone: [phone] Fax: [phone]

## 2020-08-14 ENCOUNTER — Telehealth (HOSPITAL_COMMUNITY): Payer: Self-pay

## 2020-08-14 NOTE — Telephone Encounter (Signed)
Cardiac Rehab Note:  Returned patient's call per her request. Patient updated cardiac rehab RN on her appointment with PCP yesterday. States PCP is not comfortable giving HR parameters for CR however PCP did complete EKG and patient reports resting HR of 97. Also states PCP completed labs including CBC to check for anemia. Patient requested CR RN email to send labs securely once resulted. Will continue to follow. Patient now considering allowing CR RN to contact current cardiologist in [place] for HR parameters however states she was told cardiologist will be out of the office until Thursday. Patient states she has not received call from [facility] cardiologist per referral from [facility] surgery. Informed patient that is may be several weeks before she receives appointment. Patient verbalized understanding. Will await labs from patient and establish a plan for CR enrollment at that time.  Eloise Mula E. Suzie Portela RN, BSN [place]. [facility]  Cardiac and Pulmonary Rehabilitation Phone: [phone] Fax: [phone]

## 2020-08-15 ENCOUNTER — Ambulatory Visit (HOSPITAL_COMMUNITY): Payer: BC Managed Care – PPO

## 2020-08-17 ENCOUNTER — Ambulatory Visit (HOSPITAL_COMMUNITY): Payer: BC Managed Care – PPO

## 2020-08-17 ENCOUNTER — Telehealth (HOSPITAL_COMMUNITY): Payer: Self-pay

## 2020-08-17 NOTE — Telephone Encounter (Signed)
Cardiac Rehab Telephone Note:  Successful telephone call to Gloria Hamilton to follow up on cardiac rehab enrollment and HR parameter needs from cardiologist. Per Ms. Gloria Hamilton, she needs to "place cardiac rehab on hold" until she can see a cardiologist. States she received a call from Dr. Philemon Kingdom [facility] and has an appointment as a new patient 08/27/20. She was told to not participate in CR until etiology of resting and exertional tachycardia could be established. Gloria Hamilton states her resting HR is around 100 per her apple watch and when she minimally exerts herself she can feel her heart "pounding" and her HR per watch is 142. Gloria Hamilton CR appointments will be cancelled at this time until further workup. Will follow up with patient after appointment.  Gloria Hamilton E. Gloria Portela RN, BSN Gloria Hamilton. [facility]  Cardiac and Pulmonary Rehabilitation Phone: [phone] Fax: [phone]

## 2020-08-20 ENCOUNTER — Ambulatory Visit (HOSPITAL_COMMUNITY): Payer: BC Managed Care – PPO

## 2020-08-22 ENCOUNTER — Ambulatory Visit (HOSPITAL_COMMUNITY): Payer: BC Managed Care – PPO

## 2020-08-23 DIAGNOSIS — Z952 Presence of prosthetic heart valve: Secondary | ICD-10-CM | POA: Diagnosis not present

## 2020-08-23 DIAGNOSIS — Z7901 Long term (current) use of anticoagulants: Secondary | ICD-10-CM | POA: Diagnosis not present

## 2020-08-24 ENCOUNTER — Ambulatory Visit (HOSPITAL_COMMUNITY): Payer: BC Managed Care – PPO

## 2020-08-27 ENCOUNTER — Ambulatory Visit (HOSPITAL_COMMUNITY): Payer: BC Managed Care – PPO

## 2020-08-27 DIAGNOSIS — R002 Palpitations: Secondary | ICD-10-CM | POA: Insufficient documentation

## 2020-08-27 DIAGNOSIS — R0602 Shortness of breath: Secondary | ICD-10-CM | POA: Diagnosis not present

## 2020-08-28 ENCOUNTER — Encounter (HOSPITAL_COMMUNITY): Payer: Self-pay

## 2020-08-28 ENCOUNTER — Telehealth (HOSPITAL_COMMUNITY): Payer: Self-pay

## 2020-08-28 NOTE — Telephone Encounter (Signed)
Cardiac Rehab Note:  Successful telephone encounter with Pincus Sanes to follow up on initial cardiology consult with Dr. Hardie Lora [facility], for ongoing shortness of breath and palpitations/HHR. Per EMR and Ms. Gootee, she will complete echo and exercise stress test 09/26/20. She is currently wearing a holter monitor for 3 days. Ms. Keilman agrees with discharge from Cardiac Rehab until dx for tachycardia can be obtained and HR is better controlled. Discussed with Ms. Toomey that we will be happy to enroll her in the future once she is medically stable.  Plan: patient will be discharged from the cardiac rehab exercise program secondary to medical instability.  Keni Elison E. Suzie Portela RN, BSN [place]. [facility]  Cardiac and Pulmonary Rehabilitation Phone: [phone] Fax: [phone]

## 2020-08-28 NOTE — Progress Notes (Signed)
Discharge Progress Report  Patient Details  Name: Gloria Hamilton MRN: [id number] Date of Birth: 24-Nov-1995 Referring Provider:     CARDIAC REHAB PHASE II ORIENTATION from 08/09/2020 in [facility] CARDIAC [facility]  Referring Provider Dr Kizzie Bane at [facility]/ DR Armanda Magic Covering       Number of Visits: orientation only/no exercise sessions completed  Reason for Discharge:  Early Exit:  Tachycardia of unknown origin currently being worked up by cardiology. See copy of 08/28/20 telephone encounter at bottom of note.  Smoking History:  Social History   Tobacco Use  Smoking Status Never Smoker  Smokeless Tobacco Never Used    Diagnosis:  No diagnosis found.  ADL UCSD:   Initial Exercise Prescription:  Initial Exercise Prescription - 08/09/20 1100      Date of Initial Exercise RX and Referring Provider   Date 08/09/20    Referring Provider Dr Kizzie Bane at [facility]/ DR Armanda Magic Covering    Expected Discharge Date 10/03/20      Treadmill   MPH 3    Grade 1    Minutes 15    METs 3.37      Bike   Level 1.5    Minutes 15    METs 4      Prescription Details   Frequency (times per week) 3    Duration Progress to 30 minutes of continuous aerobic without signs/symptoms of physical distress      Intensity   THRR 40-80% of Max Heartrate 78-157    Ratings of Perceived Exertion 11-13    Perceived Dyspnea 0-4      Progression   Progression Continue progressive overload as per policy without signs/symptoms or physical distress.      Resistance Training   Training Prescription Yes    Weight 2 lbs    Reps 10-15           Discharge Exercise Prescription (Final Exercise Prescription Changes):   Functional Capacity:  6 Minute Walk    Row Name 08/09/20 1010         6 Minute Walk   Phase Initial     Distance 1532 feet     Walk Time 6 minutes     # of Rest Breaks 0     MPH 2.9     METS 6.1     RPE 11     Perceived Dyspnea  0     VO2 Peak 21.4      Symptoms No     Resting HR 108 bpm     Resting BP 113/70  Automatic BP cuff     Resting Oxygen Saturation  98 %     Exercise Oxygen Saturation  during 6 min walk 97 %     Max Ex. HR 130 bpm     Max Ex. BP 108/78  Automatic BP CUff     2 Minute Post BP 108/76  Automatic BP cuff            Psychological, QOL, Others - Outcomes: PHQ 2/9: Depression screen PHQ 2/9 08/09/2020  Decreased Interest 0  Down, Depressed, Hopeless 0  PHQ - 2 Score 0    Quality of Life:  Quality of Life - 08/09/20 1251      Quality of Life   Select Quality of Life      Quality of Life Scores   Health/Function Pre 20.67 %    Socioeconomic Pre 26.5 %    Psych/Spiritual Pre 17.57 %  Family Pre 22.5 %    GLOBAL Pre 21.47 %           Personal Goals: Goals established at orientation with interventions provided to work toward goal.  Personal Goals and Risk Factors at Admission - 08/09/20 1403      Core Components/Risk Factors/Patient Goals on Admission    Weight Management Weight Maintenance;Yes    Intervention Weight Management: Develop a combined nutrition and exercise program designed to reach desired caloric intake, while maintaining appropriate intake of nutrient and fiber, sodium and fats, and appropriate energy expenditure required for the weight goal.;Weight Management: Provide education and appropriate resources to help participant work on and attain dietary goals.    Expected Outcomes Weight Maintenance: Understanding of the daily nutrition guidelines, which includes 25-35% calories from fat, 7% or less cal from saturated fats, less than 200mg  cholesterol, less than 1.5gm of sodium, & 5 or more servings of fruits and vegetables daily;Understanding recommendations for meals to include 15-35% energy as protein, 25-35% energy from fat, 35-60% energy from carbohydrates, less than 200mg  of dietary cholesterol, 20-35 gm of total fiber daily;Understanding of distribution of calorie intake throughout the  day with the consumption of 4-5 meals/snacks            Personal Goals Discharge:   Exercise Goals and Review:  Exercise Goals    Row Name 08/09/20 1116             Exercise Goals   Increase Physical Activity Yes       Intervention Provide advice, education, support and counseling about physical activity/exercise needs.;Develop an individualized exercise prescription for aerobic and resistive training based on initial evaluation findings, risk stratification, comorbidities and participant's personal goals.       Expected Outcomes Short Term: Attend rehab on a regular basis to increase amount of physical activity.;Long Term: Add in home exercise to make exercise part of routine and to increase amount of physical activity.;Long Term: Exercising regularly at least 3-5 days a week.       Increase Strength and Stamina Yes       Intervention Provide advice, education, support and counseling about physical activity/exercise needs.;Develop an individualized exercise prescription for aerobic and resistive training based on initial evaluation findings, risk stratification, comorbidities and participant's personal goals.       Expected Outcomes Short Term: Increase workloads from initial exercise prescription for resistance, speed, and METs.;Short Term: Perform resistance training exercises routinely during rehab and add in resistance training at home;Long Term: Improve cardiorespiratory fitness, muscular endurance and strength as measured by increased METs and functional capacity ( )       Able to understand and use rate of perceived exertion (RPE) scale Yes       Intervention Provide education and explanation on how to use RPE scale       Expected Outcomes Short Term: Able to use RPE daily in rehab to express subjective intensity level;Long Term:  Able to use RPE to guide intensity level when exercising independently       Knowledge and understanding of Target Heart Rate Range (THRR) Yes        Intervention Provide education and explanation of THRR including how the numbers were predicted and where they are located for reference       Expected Outcomes Short Term: Able to state/look up THRR;Short Term: Able to use daily as guideline for intensity in rehab;Long Term: Able to use THRR to govern intensity when exercising independently  Able to check pulse independently Yes       Intervention Provide education and demonstration on how to check pulse in carotid and radial arteries.;Review the importance of being able to check your own pulse for safety during independent exercise       Expected Outcomes Short Term: Able to explain why pulse checking is important during independent exercise;Long Term: Able to check pulse independently and accurately       Understanding of Exercise Prescription Yes       Intervention Provide education, explanation, and written materials on patient's individual exercise prescription       Expected Outcomes Short Term: Able to explain program exercise prescription;Long Term: Able to explain home exercise prescription to exercise independently              Exercise Goals Re-Evaluation:   Nutrition & Weight - Outcomes:  Pre Biometrics - 08/09/20 0930      Pre Biometrics   Waist Circumference 29.5 inches    Hip Circumference 37 inches    Waist to Hip Ratio 0.8 %    Triceps Skinfold 13 mm    % Body Fat 26.7 %    Grip Strength 25 kg    Flexibility 10 in    Single Leg Stand 30 seconds            Nutrition:   Nutrition Discharge:   Education Questionnaire Score:  Knowledge Questionnaire Score - 08/09/20 1101      Knowledge Questionnaire Score   Pre Score 21/24           Successful telephone encounter with Pincus Sanes to follow up on initial cardiology consult with Dr. Hardie Lora [facility], for ongoing shortness of breath and palpitations/HHR. Per EMR and Ms. Stiner, she will complete echo and exercise stress test  09/26/20. She is currently wearing a holter monitor for 3 days. Ms. Dina agrees with discharge from Cardiac Rehab until dx for tachycardia can be obtained and HR is better controlled. Discussed with Ms. Renegar that we will be happy to enroll her in the future once she is medically stable.  Plan: patient will be discharged from the cardiac rehab exercise program secondary to medical instability.

## 2020-08-29 ENCOUNTER — Ambulatory Visit (HOSPITAL_COMMUNITY): Payer: BC Managed Care – PPO

## 2020-08-29 DIAGNOSIS — Q231 Congenital insufficiency of aortic valve: Secondary | ICD-10-CM | POA: Diagnosis not present

## 2020-08-29 DIAGNOSIS — Z7901 Long term (current) use of anticoagulants: Secondary | ICD-10-CM | POA: Diagnosis not present

## 2020-08-31 ENCOUNTER — Ambulatory Visit (HOSPITAL_COMMUNITY): Payer: BC Managed Care – PPO

## 2020-09-03 ENCOUNTER — Ambulatory Visit (HOSPITAL_COMMUNITY): Payer: BC Managed Care – PPO

## 2020-09-05 ENCOUNTER — Ambulatory Visit (HOSPITAL_COMMUNITY): Payer: BC Managed Care – PPO

## 2020-09-05 DIAGNOSIS — Q231 Congenital insufficiency of aortic valve: Secondary | ICD-10-CM | POA: Diagnosis not present

## 2020-09-05 DIAGNOSIS — F330 Major depressive disorder, recurrent, mild: Secondary | ICD-10-CM | POA: Diagnosis not present

## 2020-09-05 DIAGNOSIS — Z7901 Long term (current) use of anticoagulants: Secondary | ICD-10-CM | POA: Diagnosis not present

## 2020-09-07 ENCOUNTER — Ambulatory Visit (HOSPITAL_COMMUNITY): Payer: BC Managed Care – PPO

## 2020-09-10 ENCOUNTER — Ambulatory Visit (HOSPITAL_COMMUNITY): Payer: BC Managed Care – PPO

## 2020-09-12 ENCOUNTER — Ambulatory Visit (HOSPITAL_COMMUNITY): Payer: BC Managed Care – PPO

## 2020-09-13 ENCOUNTER — Encounter (HOSPITAL_COMMUNITY): Payer: Self-pay | Admitting: Emergency Medicine

## 2020-09-13 ENCOUNTER — Ambulatory Visit (HOSPITAL_COMMUNITY)
Admission: EM | Admit: 2020-09-13 | Discharge: 2020-09-13 | Disposition: A | Discharge status: Home / Self Care | Payer: BC Managed Care – PPO | Attending: Internal Medicine | Admitting: Internal Medicine

## 2020-09-13 ENCOUNTER — Other Ambulatory Visit: Payer: Self-pay

## 2020-09-13 DIAGNOSIS — N390 Urinary tract infection, site not specified: Secondary | ICD-10-CM | POA: Diagnosis not present

## 2020-09-13 DIAGNOSIS — Z3202 Encounter for pregnancy test, result negative: Secondary | ICD-10-CM | POA: Diagnosis not present

## 2020-09-13 LAB — POCT URINALYSIS DIPSTICK, ED / UC
Bilirubin Urine: NEGATIVE
Glucose, UA: NEGATIVE mg/dL
Ketones, ur: 15 mg/dL — AB
Nitrite: POSITIVE — AB
Protein, ur: 30 mg/dL — AB
Specific Gravity, Urine: 1.02 (ref 1.005–1.030)
Urobilinogen, UA: 1 mg/dL (ref 0.0–1.0)
pH: 6.5 (ref 5.0–8.0)

## 2020-09-13 LAB — POC URINE PREG, ED: Preg Test, Ur: NEGATIVE

## 2020-09-13 MED ORDER — DOXYCYCLINE HYCLATE 100 MG PO TABS: 100.0000 mg | ORAL_TABLET | Freq: Two times a day (BID) | ORAL | 0 refills | Status: DC

## 2020-09-13 NOTE — ED Provider Notes (Signed)
[facility]    CSN: [id number] Arrival date & time: 09/13/20  1756      History   Chief Complaint Chief Complaint  Patient presents with  . Urinary Tract Infection    HPI Gloria Hamilton is a 24 y.o. female.   Presenting today with 2-3 days of urinary frequency, urgency, and dysuria now with mild suprapubic discomfort. Denies fever, vaginal discharge, itching, fever, N/V/D, concern for STIs. Has recent hx of UTI with sepsis and states this is how that incident started. So far not trying anything OTC. Of note, pt on warfarin for recent valvular surgery.      Past Medical History:  Diagnosis Date  . Migraines   . Transposition of great vessels     There are no problems to display for this patient.   Past Surgical History:  Procedure Laterality Date  . BENTALL PROCEDURE  05/17/2020   S/P Aortic Aneurysm repair , Redo thoracotomy at [facility] Dr Kizzie Bane  . CYST EXCISION     pilonodal  . Ross Procedure      OB History   No obstetric history on file.      Home Medications    Prior to Admission medications   Medication Sig Start Date End Date Taking? Authorizing Provider  aspirin 81 MG chewable tablet Chew 81 mg by mouth daily. 05/24/20 05/24/21 Yes [provider]  norethindrone (CAMILA) 0.35 MG tablet Take 1 tablet (0.35 mg total) by mouth daily. 07/16/18  Yes Rasch, Victorino Dike I, NP  warfarin (COUMADIN) 7.5 MG tablet Take 7.5 mg by mouth daily.  06/30/20  Yes [provider]  doxycycline (VIBRA-TABS) 100 MG tablet Take 1 tablet (100 mg total) by mouth 2 (two) times daily. 09/13/20   Particia Nearing, PA-C  polyethylene glycol powder [facility]) 17 GM/SCOOP powder Take 17 g by mouth daily as needed. 05/23/20   [provider]  senna-docusate (SENOKOT-S) 8.6-50 MG tablet Take 1 tablet by mouth daily as needed. 05/23/20   [provider]    Family History History reviewed. No pertinent family history.  Social  History Social History   Tobacco Use  . Smoking status: Never Smoker  . Smokeless tobacco: Never Used  Substance Use Topics  . Alcohol use: No  . Drug use: No     Allergies   Ceftriaxone, Ciprofloxacin, and Lisinopril   Review of Systems Review of Systems PER HPI   Physical Exam Triage Vital Signs ED Triage Vitals  Enc Vitals Group     BP 09/13/20 1916 104/82     Pulse Rate 09/13/20 1916 (!) 112     Resp 09/13/20 1916 18     Temp 09/13/20 1916 98.1 F (36.7 C)     Temp Source 09/13/20 1916 Oral     SpO2 09/13/20 1916 100 %     Weight --      Height --      Head Circumference --      Peak Flow --      Pain Score 09/13/20 1912 3     Pain Loc --      Pain Edu? --      Excl. in GC? --    No data found.  Updated Vital Signs BP 104/82 (BP Location: Right Arm)   Pulse (!) 112   Temp 98.1 F (36.7 C) (Oral)   Resp 18   LMP 08/16/2020   SpO2 100%   Visual Acuity Right Eye Distance:   Left Eye Distance:  Bilateral Distance:    Right Eye Near:   Left Eye Near:    Bilateral Near:     Physical Exam Vitals and nursing note reviewed.  Constitutional:      Appearance: Normal appearance. She is not ill-appearing.  HENT:     Head: Atraumatic.     Mouth/Throat:     Mouth: Mucous membranes are moist.     Pharynx: Oropharynx is clear.  Eyes:     Extraocular Movements: Extraocular movements intact.     Conjunctiva/sclera: Conjunctivae normal.  Cardiovascular:     Rate and Rhythm: Normal rate and regular rhythm.     Heart sounds: Normal heart sounds.  Pulmonary:     Effort: Pulmonary effort is normal.     Breath sounds: Normal breath sounds.  Abdominal:     General: Bowel sounds are normal. There is no distension.     Palpations: Abdomen is soft.     Tenderness: There is abdominal tenderness (mild ttp suprapubic ttp). There is no right CVA tenderness, left CVA tenderness or guarding.  Musculoskeletal:        General: Normal range of motion.     Cervical  back: Normal range of motion and neck supple.  Skin:    General: Skin is warm and dry.  Neurological:     Mental Status: She is alert and oriented to person, place, and time.  Psychiatric:        Mood and Affect: Mood normal.        Thought Content: Thought content normal.        Judgment: Judgment normal.     UC Treatments / Results  Labs (all labs ordered are listed, but only abnormal results are displayed) Labs Reviewed  POCT URINALYSIS DIPSTICK, ED / UC - Abnormal; Notable for the following components:      Result Value   Ketones, ur 15 (*)    Hgb urine dipstick LARGE (*)    Protein, ur 30 (*)    Nitrite POSITIVE (*)    Leukocytes,Ua MODERATE (*)    All other components within normal limits  URINE CULTURE  POC URINE PREG, ED    EKG   Radiology No results found.  Procedures Procedures (including critical care time)  Medications Ordered in UC Medications - No data to display  Initial Impression / Assessment and Plan / UC Course  I have reviewed the triage vital signs and the nursing notes.  Pertinent labs & imaging results that were available during my care of the patient were reviewed by me and considered in my medical decision making (see chart for details).     U/A positive for UTI, will send for urine culture. Start doxycycline given allergies and concurrent warfarin, discussed with her to f/u with provider managing her warfarin to see if they want to recheck sooner than scheduled/adjust. Vitals, exam today reassuring. She has been managed for her mild tachycardia by Cardiology and they are currently working this up, and today HR is at her baseline without CP, SOB.  Return if worsening or not resolving.  Final Clinical Impressions(s) / UC Diagnoses   Final diagnoses:  Lower urinary tract infectious disease   Discharge Instructions   None    ED Prescriptions    Medication Sig Dispense Auth. Provider   doxycycline (VIBRA-TABS) 100 MG tablet Take 1  tablet (100 mg total) by mouth 2 (two) times daily. 14 tablet Particia Nearing, [place]     PDMP not reviewed this encounter.  Particia Nearing, [place] 09/13/20 2023

## 2020-09-13 NOTE — ED Triage Notes (Signed)
Patient is on blood thinners due to heart surgery in august.    Patient is here today due to uti symptoms.  Pain with urinating, odor, frequent urination for small amounts.  No abdominal or back pain

## 2020-09-14 ENCOUNTER — Ambulatory Visit (HOSPITAL_COMMUNITY): Payer: BC Managed Care – PPO

## 2020-09-14 DIAGNOSIS — F330 Major depressive disorder, recurrent, mild: Secondary | ICD-10-CM | POA: Diagnosis not present

## 2020-09-16 LAB — URINE CULTURE: Culture: >=100000 — AB

## 2020-09-17 ENCOUNTER — Telehealth (HOSPITAL_COMMUNITY): Payer: Self-pay | Admitting: Internal Medicine

## 2020-09-17 ENCOUNTER — Ambulatory Visit (HOSPITAL_COMMUNITY): Payer: BC Managed Care – PPO

## 2020-09-17 MED ORDER — NITROFURANTOIN MONOHYD MACRO 100 MG PO CAPS: 100.0000 mg | ORAL_CAPSULE | Freq: Two times a day (BID) | ORAL | 0 refills | Status: DC

## 2020-09-17 NOTE — Telephone Encounter (Signed)
Cultures grows E.coli ss to Nitrofurantoin. Macrobid sent to the pharmacy.

## 2020-09-18 DIAGNOSIS — Q231 Congenital insufficiency of aortic valve: Secondary | ICD-10-CM | POA: Diagnosis not present

## 2020-09-18 DIAGNOSIS — Z7901 Long term (current) use of anticoagulants: Secondary | ICD-10-CM | POA: Diagnosis not present

## 2020-09-19 ENCOUNTER — Ambulatory Visit (HOSPITAL_COMMUNITY): Payer: BC Managed Care – PPO

## 2020-09-21 ENCOUNTER — Ambulatory Visit (HOSPITAL_COMMUNITY): Payer: BC Managed Care – PPO

## 2020-09-21 DIAGNOSIS — F330 Major depressive disorder, recurrent, mild: Secondary | ICD-10-CM | POA: Diagnosis not present

## 2020-09-24 ENCOUNTER — Ambulatory Visit (HOSPITAL_COMMUNITY): Payer: BC Managed Care – PPO

## 2020-09-26 ENCOUNTER — Ambulatory Visit (HOSPITAL_COMMUNITY): Payer: BC Managed Care – PPO

## 2020-09-26 DIAGNOSIS — R0602 Shortness of breath: Secondary | ICD-10-CM | POA: Diagnosis not present

## 2020-09-26 DIAGNOSIS — R002 Palpitations: Secondary | ICD-10-CM | POA: Diagnosis not present

## 2020-09-27 DIAGNOSIS — Z7901 Long term (current) use of anticoagulants: Secondary | ICD-10-CM | POA: Diagnosis not present

## 2020-09-28 ENCOUNTER — Ambulatory Visit (HOSPITAL_COMMUNITY): Payer: BC Managed Care – PPO

## 2020-10-01 ENCOUNTER — Ambulatory Visit (HOSPITAL_COMMUNITY): Payer: BC Managed Care – PPO

## 2020-10-01 DIAGNOSIS — I5032 Chronic diastolic (congestive) heart failure: Secondary | ICD-10-CM | POA: Diagnosis not present

## 2020-10-01 DIAGNOSIS — R0602 Shortness of breath: Secondary | ICD-10-CM | POA: Diagnosis not present

## 2020-10-01 DIAGNOSIS — I351 Nonrheumatic aortic (valve) insufficiency: Secondary | ICD-10-CM | POA: Diagnosis not present

## 2020-10-01 DIAGNOSIS — F330 Major depressive disorder, recurrent, mild: Secondary | ICD-10-CM | POA: Diagnosis not present

## 2020-10-01 DIAGNOSIS — I719 Aortic aneurysm of unspecified site, without rupture: Secondary | ICD-10-CM | POA: Diagnosis not present

## 2020-10-02 DIAGNOSIS — Z7901 Long term (current) use of anticoagulants: Secondary | ICD-10-CM | POA: Diagnosis not present

## 2020-10-02 DIAGNOSIS — Q231 Congenital insufficiency of aortic valve: Secondary | ICD-10-CM | POA: Diagnosis not present

## 2020-10-03 ENCOUNTER — Ambulatory Visit (HOSPITAL_COMMUNITY): Payer: BC Managed Care – PPO

## 2020-10-10 DIAGNOSIS — Z7901 Long term (current) use of anticoagulants: Secondary | ICD-10-CM | POA: Diagnosis not present

## 2020-10-12 DIAGNOSIS — Z20822 Contact with and (suspected) exposure to covid-19: Secondary | ICD-10-CM | POA: Diagnosis not present

## 2020-10-17 DIAGNOSIS — Z7901 Long term (current) use of anticoagulants: Secondary | ICD-10-CM | POA: Diagnosis not present

## 2020-10-22 DIAGNOSIS — F330 Major depressive disorder, recurrent, mild: Secondary | ICD-10-CM | POA: Diagnosis not present

## 2020-10-24 DIAGNOSIS — Z7901 Long term (current) use of anticoagulants: Secondary | ICD-10-CM | POA: Diagnosis not present

## 2020-10-25 ENCOUNTER — Telehealth (HOSPITAL_COMMUNITY): Payer: Self-pay | Admitting: Physician Assistant

## 2020-10-29 DIAGNOSIS — F330 Major depressive disorder, recurrent, mild: Secondary | ICD-10-CM | POA: Diagnosis not present

## 2020-10-31 DIAGNOSIS — Z7901 Long term (current) use of anticoagulants: Secondary | ICD-10-CM | POA: Diagnosis not present

## 2020-10-31 DIAGNOSIS — Q231 Congenital insufficiency of aortic valve: Secondary | ICD-10-CM | POA: Diagnosis not present

## 2020-11-05 DIAGNOSIS — F330 Major depressive disorder, recurrent, mild: Secondary | ICD-10-CM | POA: Diagnosis not present

## 2020-11-07 ENCOUNTER — Other Ambulatory Visit: Payer: Self-pay

## 2020-11-07 ENCOUNTER — Ambulatory Visit (HOSPITAL_COMMUNITY)
Admission: EM | Admit: 2020-11-07 | Discharge: 2020-11-07 | Disposition: A | Discharge status: Home / Self Care | Payer: BC Managed Care – PPO | Attending: Internal Medicine | Admitting: Internal Medicine

## 2020-11-07 ENCOUNTER — Encounter (HOSPITAL_COMMUNITY): Payer: Self-pay | Admitting: Emergency Medicine

## 2020-11-07 DIAGNOSIS — Z113 Encounter for screening for infections with a predominantly sexual mode of transmission: Secondary | ICD-10-CM | POA: Diagnosis not present

## 2020-11-07 DIAGNOSIS — N3001 Acute cystitis with hematuria: Secondary | ICD-10-CM | POA: Diagnosis not present

## 2020-11-07 DIAGNOSIS — Z7901 Long term (current) use of anticoagulants: Secondary | ICD-10-CM | POA: Diagnosis not present

## 2020-11-07 LAB — POCT URINALYSIS DIPSTICK, ED / UC
Bilirubin Urine: NEGATIVE
Glucose, UA: NEGATIVE mg/dL
Ketones, ur: NEGATIVE mg/dL
Nitrite: NEGATIVE
Protein, ur: NEGATIVE mg/dL
Specific Gravity, Urine: 1.02 (ref 1.005–1.030)
Urobilinogen, UA: 1 mg/dL (ref 0.0–1.0)
pH: 6 (ref 5.0–8.0)

## 2020-11-07 MED ORDER — NITROFURANTOIN MONOHYD MACRO 100 MG PO CAPS: 100.0000 mg | ORAL_CAPSULE | Freq: Two times a day (BID) | ORAL | 0 refills | Status: DC

## 2020-11-07 NOTE — ED Triage Notes (Signed)
Patient c/o dysuria and abdomina pain x 1 day.   Patient endorses burning with urination, back pain,  and increased urinary frequency.   Patient wants STD testing.   Patient denies vaginal discharge.   Patient hasn't taken any medications for symptoms.

## 2020-11-07 NOTE — Discharge Instructions (Addendum)
Please take medication as prescribed. We will call you with lab results if abnormal. Return to urgent care if symptoms are worsening.

## 2020-11-07 NOTE — ED Provider Notes (Signed)
[facility]    CSN: [id number] Arrival date & time: 11/07/20  7829      History   Chief Complaint Chief Complaint  Patient presents with  . Dysuria  . Abdominal Pain    HPI Gloria Hamilton is a 25 y.o. female comes to urgent care with a 2-day history of dysuria, urgency and frequency.  Patient denies any fever or chills.  No flank pain.  No nausea or vomiting.  No bloody urine.  No vaginal discharge.  Patient is in a monogamous relationship and is requesting STD testing.   HPI  Past Medical History:  Diagnosis Date  . Migraines   . Transposition of great vessels     There are no problems to display for this patient.   Past Surgical History:  Procedure Laterality Date  . BENTALL PROCEDURE  05/17/2020   S/P Aortic Aneurysm repair , Redo thoracotomy at [facility] Dr Kizzie Bane  . CYST EXCISION     pilonodal  . Ross Procedure      OB History   No obstetric history on file.      Home Medications    Prior to Admission medications   Medication Sig Start Date End Date Taking? Authorizing Provider  aspirin 81 MG chewable tablet Chew 81 mg by mouth daily. 05/24/20 05/24/21 Yes [provider]  nitrofurantoin, macrocrystal-monohydrate, (MACROBID) 100 MG capsule Take 1 capsule (100 mg total) by mouth 2 (two) times daily. 11/07/20  Yes Laraine Samet, Britta Mccreedy, MD  norethindrone (CAMILA) 0.35 MG tablet Take 1 tablet (0.35 mg total) by mouth daily. 07/16/18  Yes Rasch, Victorino Dike I, NP  warfarin (COUMADIN) 7.5 MG tablet Take 9.5 mg by mouth daily. 06/30/20  Yes [provider]  polyethylene glycol powder (GLYCOLAX/MIRALAX) 17 GM/SCOOP powder Take 17 g by mouth daily as needed. 05/23/20   [provider]  senna-docusate (SENOKOT-S) 8.6-50 MG tablet Take 1 tablet by mouth daily as needed. 05/23/20   [provider]    Family History History reviewed. No pertinent family history.  Social History Social History   Tobacco Use  . Smoking status: Never  Smoker  . Smokeless tobacco: Never Used  Substance Use Topics  . Alcohol use: No  . Drug use: No     Allergies   Ceftriaxone, Ciprofloxacin, and Lisinopril   Review of Systems Review of Systems  Genitourinary: Positive for dysuria, frequency and urgency. Negative for flank pain, vaginal discharge and vaginal pain.  Musculoskeletal: Negative.      Physical Exam Triage Vital Signs ED Triage Vitals  Enc Vitals Group     BP 11/07/20 0911 103/74     Pulse Rate 11/07/20 0911 (!) 114     Resp 11/07/20 0911 13     Temp 11/07/20 0911 98.7 F (37.1 C)     Temp Source 11/07/20 0911 Oral     SpO2 11/07/20 0911 99 %     Weight --      Height --      Head Circumference --      Peak Flow --      Pain Score 11/07/20 0909 2     Pain Loc --      Pain Edu? --      Excl. in GC? --    No data found.  Updated Vital Signs BP 103/74 (BP Location: Right Arm)   Pulse (!) 114   Temp 98.7 F (37.1 C) (Oral)   Resp 13   LMP 10/28/2020   SpO2 99%  Visual Acuity Right Eye Distance:   Left Eye Distance:   Bilateral Distance:    Right Eye Near:   Left Eye Near:    Bilateral Near:     Physical Exam Vitals and nursing note reviewed.  Constitutional:      Appearance: She is not ill-appearing.  Abdominal:     Palpations: Abdomen is soft.     Tenderness: There is no abdominal tenderness.     Hernia: No hernia is present.  Neurological:     Mental Status: She is alert.      UC Treatments / Results  Labs (all labs ordered are listed, but only abnormal results are displayed) Labs Reviewed  POCT URINALYSIS DIPSTICK, ED / UC - Abnormal; Notable for the following components:      Result Value   Hgb urine dipstick TRACE (*)    Leukocytes,Ua SMALL (*)    All other components within normal limits  URINE CULTURE  CYTOLOGY, (ORAL, ANAL, URETHRAL) ANCILLARY ONLY    EKG   Radiology No results found.  Procedures Procedures (including critical care time)  Medications  Ordered in UC Medications - No data to display  Initial Impression / Assessment and Plan / UC Course  I have reviewed the triage vital signs and the nursing notes.  Pertinent labs & imaging results that were available during my care of the patient were reviewed by me and considered in my medical decision making (see chart for details).     1.  Acute cystitis with hematuria: Macrobid 100 mg twice daily for 5 days Point-of-care urinalysis is significant for leukocyte esterase and hemoglobin Urine cultures We will call patient with culture results if abnormal. Return precautions given Cervicovaginal swab for GC/chlamydia/trichomonas. Final Clinical Impressions(s) / UC Diagnoses   Final diagnoses:  Acute cystitis with hematuria  Screen for STD (sexually transmitted disease)     Discharge Instructions     Please take medication as prescribed. We will call you with lab results if abnormal. Return to urgent care if symptoms are worsening.    ED Prescriptions    Medication Sig Dispense Auth. Provider   nitrofurantoin, macrocrystal-monohydrate, (MACROBID) 100 MG capsule Take 1 capsule (100 mg total) by mouth 2 (two) times daily. 10 capsule Ariadna Setter, Britta Mccreedy, MD     PDMP not reviewed this encounter.   Merrilee Jansky, MD 11/07/20 [phone]

## 2020-11-08 LAB — CYTOLOGY, (ORAL, ANAL, URETHRAL) ANCILLARY ONLY
Comment: NEGATIVE
Trichomonas: NEGATIVE

## 2020-11-09 LAB — URINE CULTURE: Culture: >=100000 — AB

## 2020-11-12 NOTE — Progress Notes (Signed)
Cardiology Office Note:    Date:  11/15/2020   ID:  Gloria Hamilton, DOB 1996-02-14, MRN [id number]  PCP:  Scifres, Peter Minium  [facility] Cardiologist:  Meriam Sprague, MD  [facility] Electrophysiologist:  None   Referring MD: Clementeen Graham, PA-C    History of Present Illness:    Gloria Hamilton is a 25 y.o. female with a hx of bicuspid aortic valve with aortic root dilation s/p mechanical AVR and root repair 06/06/20 on warfarin with goal INR 2-3, prior Ross procedure who was referred by Clementeen Graham, PA-C for further evaluation of her aortic valve disease.  Last seen in [facility] on 10/01/20 where she was doing well. Had an echocardiogram showing she mild segmental LV systolic dysfunction with septal hypokinesis most consistent with her previous surgical intervention rather than other issues. Ejection fraction was 40 to 45%. She did have a normally functioning aortic valve prosthesis with normal velocities. The patient additionally underwent a treadmill EKG to assess exercise tolerance status post surgery with excellent exercise tolerance and no evidence of significant tachycardia or other rhythm disturbances. The patient did have shortness of breath and weakness with a stress test but no evidence of other symptoms. Patient did have a Holter monitor showing an average heart rate of 99 bpm with no evidence of rhythm disturbances. She previously had been on beta-blocker which caused some side effects. In addition to that the patient has otherwise felt fairly well and has tolerated her anticoagulation without any bleeding complications.  Echo complete  Result Value Ref Range  LV Ejection Fraction (%) 40  Aortic Valve Regurgitation Grade trivial  Aortic Valve Stenosis Grade mechanical prosthetic  Aortic Valve Max Velocity (m/s) 1 m/sec  Aortic Valve Stenosis Mean Gradient (mmHg) 2.3 mmHg  Mitral Valve Regurgitation Grade mild  Mitral Valve Stenosis Grade none  Tricuspid Valve  Regurgitation Grade mild  Tricuspid Valve Regurgitation Max Velocity (m/s) 2.3 m/sec  Right Ventricle Systolic Pressure (mmHg) 27.0 mmHg  LV End Diastolic Diameter (cm) 4.6 cm  LV End Systolic Diameter (cm) 3.4 cm  LV Septum Wall Thickness (cm) 1.1 cm  LV Posterior Wall Thickness (cm) 0.87 cm  Left Atrium Diameter (cm) 2.7 cm  EF 40%  Was seen by PCP on 08/13/20 for tachycardia with HR 110s. Specifically, prior to starting cardiac rehab, they wanted clearance given her elevated HR. She denies any chest pain or SOB. Has been doing well post-operatively, but has been an elevated HR 100s-110s while sitting and 120s with walking. Prior to her surgery, resting HR was in the 60s. Has occasional fluttering in her chest and feels like her heart can stop for a second and then goes back again. Tries to stay hydrated but has a hard time doing it. No LE edema, orthopnea, chest pain, nausea, vomiting, or bleeding issues. Had prior zio monitor for 3 days which was normal. Has been on metoprolol in the past and it dropped her blood pressure. She was taking metoprolol 25mg  at that time  Currently taking warfarin 9.5mg . Wants referral to our anticoagulation clinic.   No significant caffeine intake or supplement use. Very rare alcohol use.    Past Medical History:  Diagnosis Date  . Anxiety   . Bicuspid aortic valve   . CHF (congestive heart failure) (HCC)   . Congenital aortic insufficiency   . H/O aortic valve replacement   . Heart murmur   . Migraines   . Transposition of great vessels     Past Surgical History:  Procedure Laterality Date  . BENTALL PROCEDURE  05/17/2020   S/P Aortic Aneurysm repair , Redo thoracotomy at [facility] Dr Kizzie Bane  . CYST EXCISION     pilonodal  . Ross Procedure      Current Medications: Current Meds  Medication Sig  . acetaminophen (TYLENOL) 325 MG tablet 1 tablet as needed  . aspirin 81 MG chewable tablet Chew 81 mg by mouth daily.  . metoprolol succinate (TOPROL  XL) 25 MG 24 hr tablet Take 0.5 tablets (12.5 mg total) by mouth every evening.  . norethindrone (CAMILA) 0.35 MG tablet Take 1 tablet (0.35 mg total) by mouth daily.  Marland Kitchen warfarin (COUMADIN) 7.5 MG tablet Take 9.5 mg by mouth daily.     Allergies:   Ceftriaxone, Ciprofloxacin, and Lisinopril   Social History   Socioeconomic History  . Marital status: Significant Other    Spouse name: Not on file  . Number of children: Not on file  . Years of education: Not on file  . Highest education level: Not on file  Occupational History  . Not on file  Tobacco Use  . Smoking status: Never Smoker  . Smokeless tobacco: Never Used  Substance and Sexual Activity  . Alcohol use: No  . Drug use: No  . Sexual activity: Not on file  Other Topics Concern  . Not on file  Social History Narrative  . Not on file   Social Determinants of Health   Financial Resource Strain: Not on file  Food Insecurity: Not on file  Transportation Needs: Not on file  Physical Activity: Not on file  Stress: Not on file  Social Connections: Not on file     Family History: The patient's family history is not on file.  ROS:   Please see the history of present illness.    Review of Systems  Constitutional: Negative for chills and fever.  HENT: Negative for nosebleeds.   Eyes: Negative for blurred vision.  Respiratory: Negative for shortness of breath.   Cardiovascular: Positive for palpitations. Negative for chest pain, orthopnea, claudication, leg swelling and PND.  Gastrointestinal: Negative for blood in stool, melena, nausea and vomiting.  Genitourinary: Negative for hematuria.  Musculoskeletal: Negative for falls.  Neurological: Positive for dizziness. Negative for loss of consciousness.  Endo/Heme/Allergies: Negative for polydipsia.  Psychiatric/Behavioral: Negative for substance abuse.    EKGs/Labs/Other Studies Reviewed:    The following studies were reviewed today: TTE 10-02-20: Echo complete   Result Value Ref Range  LV Ejection Fraction (%) 40  Aortic Valve Regurgitation Grade trivial  Aortic Valve Stenosis Grade mechanical prosthetic  Aortic Valve Max Velocity (m/s) 1 m/sec  Aortic Valve Stenosis Mean Gradient (mmHg) 2.3 mmHg  Mitral Valve Regurgitation Grade mild  Mitral Valve Stenosis Grade none  Tricuspid Valve Regurgitation Grade mild  Tricuspid Valve Regurgitation Max Velocity (m/s) 2.3 m/sec  Right Ventricle Systolic Pressure (mmHg) 27.0 mmHg  LV End Diastolic Diameter (cm) 4.6 cm  LV End Systolic Diameter (cm) 3.4 cm  LV Septum Wall Thickness (cm) 1.1 cm  LV Posterior Wall Thickness (cm) 0.87 cm  Left Atrium Diameter (cm) 2.7 cm     EKG:  EKG is  ordered today.  The ekg ordered today demonstrates NSR with HR 94; no ischemia or block  Recent Labs: No results found for requested labs within last 8760 hours.  Recent Lipid Panel No results found for: CHOL, TRIG, HDL, CHOLHDL, VLDL, LDLCALC, LDLDIRECT    Physical Exam:    VS:  BP Marland Kitchen)  104/50   Pulse 94   Ht 5\' 3"  (1.6 m)   Wt 128 lb 12.8 oz (58.4 kg)   LMP 10/28/2020   SpO2 96%   BMI 22.82 kg/m     Wt Readings from Last 3 Encounters:  11/15/20 128 lb 12.8 oz (58.4 kg)  08/09/20 125 lb (56.7 kg)  07/16/18 121 lb 12.8 oz (55.2 kg)     GEN:  Well nourished, well developed in no acute distress HEENT: Normal NECK: No JVD; No carotid bruits CARDIAC: RRR,crisp mechanical valve click, 2/6 systolic murmur RESPIRATORY:  Clear to auscultation without rales, wheezing or rhonchi  ABDOMEN: Soft, non-tender, non-distended MUSCULOSKELETAL:  No edema; No deformity  SKIN: Warm and dry NEUROLOGIC:  Alert and oriented x 3 PSYCHIATRIC:  Normal affect   ASSESSMENT:    1. Bicuspid aortic valve with ascending aorta greater than 4.0 cm in diameter   2. Palpitations   3. Aortic valve disease   4. Nonrheumatic aortic valve insufficiency   5. Status post mechanical aortic valve replacement    PLAN:    In order of  problems listed above:  #Bicuspid AoV with aortic dilation s/p mechanical AVR with root repair: #Chronic systolic heart failure with mildly reduced LVEF 40%: Surgery 05/16/20 at [facility]. LVEF 40%. Doing well with normal gradients across AoV. Tolerating warfarin with goal INR 2-3. Stress test negative for ischemia. -Continue warfarin with goal INR 2-3mg  -Given LVEF 40%, would benefit from low dose BB and ARB if able -Will start with trial of metop 12.5mg  daily today and monitor response -TTE at [facility] with normal gradients across the valve, no significant AR -Okay for cardiac rehab--will refer today -Will refer to our anticoagulation clinic for management of warfarin -Discussed that her first degree relatives need to be screened for Bicuspid AoV/aortopathy -Will follow-up cardiac MRI ordered by CV surgeon in 49months  #Sinus Tachycardia: #Palpitations: Patient with sinus tachycardia post-operatively. Likely secondary to deconditioning and mildly reduced LVEF and suspect this will get better with graduated exercise program. Also prior to surgery, she was taking metop 50mg  which has been stopped. States she has occasional heart "flutters" and episodes where she feels like her heart is skipping a beat.  TTE as above with normal gradients across the valve. LVEF mildly reduced at 40% per their report. Stress test normal.  -Check 14 day zio -Start low dose beta blocker given mildly reduced LVEF as detailed above -Stress test at [facility] with no evidence of ischemia -Okay for cardiac rehab from CV perspective--will refer as above -Check TSH -Not anemic -Maintain adequate hydration -Not using significant caffeine or supplement use  Follow-up in 71months   Medication Adjustments/Labs and Tests Ordered: Current medicines are reviewed at length with the patient today.  Concerns regarding medicines are outlined above.  Orders Placed This Encounter  Procedures  . TSH  . Amb Referral to Cardiac  Rehabilitation  . LONG TERM MONITOR (3-14 DAYS)  . EKG 12-Lead   Meds ordered this encounter  Medications  . metoprolol succinate (TOPROL XL) 25 MG 24 hr tablet    Sig: Take 0.5 tablets (12.5 mg total) by mouth every evening.    Dispense:  45 tablet    Refill:  3    Patient Instructions  Medication Instructions:  Your physician has recommended you make the following change in your medication: Start metoprolol succinate 12.5 mg by mouth daily at night  *If you need a refill on your cardiac medications before your next appointment, please call your pharmacy*  Lab Work: Lab work to be done today--TSH If you have labs (blood work) drawn today and your tests are completely normal, you will receive your results only by: Marland Kitchen MyChart Message (if you have MyChart) OR . A paper copy in the mail If you have any lab test that is abnormal or we need to change your treatment, we will call you to review the results.   Testing/Procedures: Your physician has recommended that you wear a holter monitor. Holter monitors are medical devices that record the heart's electrical activity. Doctors most often use these monitors to diagnose arrhythmias. Arrhythmias are problems with the speed or rhythm of the heartbeat. The monitor is a small, portable device. You can wear one while you do your normal daily activities. This is usually used to diagnose what is causing palpitations/syncope (passing out). 2 week zio    Follow-Up: At [facility], you and your health needs are our priority.  As part of our continuing mission to provide you with exceptional heart care, we have created designated Provider Care Teams.  These Care Teams include your primary Cardiologist (physician) and Advanced Practice Providers (APPs -  Physician Assistants and Nurse Practitioners) who all work together to provide you with the care you need, when you need it.  We recommend signing up for the patient portal called "MyChart".  Sign  up information is provided on this After Visit Summary.  MyChart is used to connect with patients for Virtual Visits (Telemedicine).  Patients are able to view lab/test results, encounter notes, upcoming appointments, etc.  Non-urgent messages can be sent to your provider as well.   To learn more about what you can do with MyChart, go to [email or web address].    Your next appointment:   May 11,2022 at 8:00  The format for your next appointment:   In Person  Provider:    Dr Shari Prows    Other Instructions  ZIO XT- Long Term Monitor Instructions   Your physician has requested you wear your ZIO patch monitor___14____days.   This is a single patch monitor.  [employer] supplies one patch monitor per enrollment.  Additional stickers are not available.   Please do not apply patch if you will be having a Nuclear Stress Test, Echocardiogram, Cardiac CT, MRI, or Chest Xray during the time frame you would be wearing the monitor. The patch cannot be worn during these tests.  You cannot remove and re-apply the ZIO XT patch monitor.   Your ZIO patch monitor will be sent USPS Priority mail from [facility] directly to your home address. The monitor may also be mailed to a PO BOX if home delivery is not available.   It may take 3-5 days to receive your monitor after you have been enrolled.   Once you have received you monitor, please review enclosed instructions.  Your monitor has already been registered assigning a specific monitor serial # to you.   Applying the monitor   Shave hair from upper left chest.   Hold abrader disc by orange tab.  Rub abrader in 40 strokes over left upper chest as indicated in your monitor instructions.   Clean area with 4 enclosed alcohol pads .  Use all pads to assure are is cleaned thoroughly.  Let dry.   Apply patch as indicated in monitor instructions.  Patch will be place under collarbone on left side of chest with arrow pointing upward.   Rub patch  adhesive wings for 2 minutes.Remove white label marked "1".  Remove  white label marked "2".  Rub patch adhesive wings for 2 additional minutes.   While looking in a mirror, press and release button in center of patch.  A small green light will flash 3-4 times .  This will be your only indicator the monitor has been turned on.     Do not shower for the first 24 hours.  You may shower after the first 24 hours.   Press button if you feel a symptom. You will hear a small click.  Record Date, Time and Symptom in the Patient Log Book.   When you are ready to remove patch, follow instructions on last 2 pages of Patient Log Book.  Stick patch monitor onto last page of Patient Log Book.   Place Patient Log Book in [place] box.  Use locking tab on box and tape box closed securely.  The [employer] and [employer] box has [employer] postage on it.  Please place in mailbox as soon as possible.  Your physician should have your test results approximately 7 days after the monitor has been [facility] to [facility].   Call [facility] Customer Care at [phone] if you have questions regarding your ZIO XT patch monitor.  Call them immediately if you see an orange light blinking on your monitor.   If your monitor falls off in less than 4 days contact our Monitor department at [phone].  If your monitor becomes loose or falls off after 4 days call [employer] at [phone] for suggestions on securing your monitor.    [facility]    Signed, Meriam SpragueHeather E Fitzgerald Dunne, MD  11/15/2020 10:26 AM    [facility]

## 2020-11-14 DIAGNOSIS — F330 Major depressive disorder, recurrent, mild: Secondary | ICD-10-CM | POA: Diagnosis not present

## 2020-11-15 ENCOUNTER — Ambulatory Visit (INDEPENDENT_AMBULATORY_CARE_PROVIDER_SITE_OTHER): Payer: BC Managed Care – PPO

## 2020-11-15 ENCOUNTER — Ambulatory Visit: Payer: BC Managed Care – PPO | Admitting: Cardiology

## 2020-11-15 ENCOUNTER — Other Ambulatory Visit: Payer: Self-pay

## 2020-11-15 ENCOUNTER — Ambulatory Visit (INDEPENDENT_AMBULATORY_CARE_PROVIDER_SITE_OTHER): Payer: BC Managed Care – PPO | Admitting: *Deleted

## 2020-11-15 ENCOUNTER — Encounter: Payer: Self-pay | Admitting: *Deleted

## 2020-11-15 ENCOUNTER — Encounter: Payer: Self-pay | Admitting: Cardiology

## 2020-11-15 ENCOUNTER — Encounter (HOSPITAL_COMMUNITY): Payer: Self-pay | Admitting: *Deleted

## 2020-11-15 VITALS — BP 104/50 | HR 94 | Ht 63.0 in | Wt 128.8 lb

## 2020-11-15 DIAGNOSIS — Q231 Congenital insufficiency of aortic valve: Secondary | ICD-10-CM

## 2020-11-15 DIAGNOSIS — Z952 Presence of prosthetic heart valve: Secondary | ICD-10-CM

## 2020-11-15 DIAGNOSIS — R002 Palpitations: Secondary | ICD-10-CM | POA: Diagnosis not present

## 2020-11-15 DIAGNOSIS — Z5181 Encounter for therapeutic drug level monitoring: Secondary | ICD-10-CM | POA: Insufficient documentation

## 2020-11-15 DIAGNOSIS — I351 Nonrheumatic aortic (valve) insufficiency: Secondary | ICD-10-CM | POA: Diagnosis not present

## 2020-11-15 DIAGNOSIS — I359 Nonrheumatic aortic valve disorder, unspecified: Secondary | ICD-10-CM

## 2020-11-15 DIAGNOSIS — Z7901 Long term (current) use of anticoagulants: Secondary | ICD-10-CM | POA: Diagnosis not present

## 2020-11-15 LAB — TSH: TSH: 1.22 u[IU]/mL (ref 0.450–4.500)

## 2020-11-15 LAB — POCT INR: INR: 2.5 (ref 2.0–3.0)

## 2020-11-15 MED ORDER — METOPROLOL SUCCINATE ER 25 MG PO TB24: 12.5000 mg | ORAL_TABLET | Freq: Every evening | ORAL | 3 refills | Status: DC

## 2020-11-15 NOTE — Patient Instructions (Signed)
Medication Instructions:  Your physician has recommended you make the following change in your medication: Start metoprolol succinate 12.5 mg by mouth daily at night  *If you need a refill on your cardiac medications before your next appointment, please call your pharmacy*   Lab Work: Lab work to be done today--TSH If you have labs (blood work) drawn today and your tests are completely normal, you will receive your results only by: Marland Kitchen MyChart Message (if you have MyChart) OR . A paper copy in the mail If you have any lab test that is abnormal or we need to change your treatment, we will call you to review the results.   Testing/Procedures: Your physician has recommended that you wear a holter monitor. Holter monitors are medical devices that record the heart's electrical activity. Doctors most often use these monitors to diagnose arrhythmias. Arrhythmias are problems with the speed or rhythm of the heartbeat. The monitor is a small, portable device. You can wear one while you do your normal daily activities. This is usually used to diagnose what is causing palpitations/syncope (passing out). 2 week zio    Follow-Up: At [facility], you and your health needs are our priority.  As part of our continuing mission to provide you with exceptional heart care, we have created designated Provider Care Teams.  These Care Teams include your primary Cardiologist (physician) and Advanced Practice Providers (APPs -  Physician Assistants and Nurse Practitioners) who all work together to provide you with the care you need, when you need it.  We recommend signing up for the patient portal called "MyChart".  Sign up information is provided on this After Visit Summary.  MyChart is used to connect with patients for Virtual Visits (Telemedicine).  Patients are able to view lab/test results, encounter notes, upcoming appointments, etc.  Non-urgent messages can be sent to your provider as well.   To learn more  about what you can do with MyChart, go to [email or web address].    Your next appointment:   May 11,2022 at 8:00  The format for your next appointment:   In Person  Provider:    Dr Shari Prows    Other Instructions  ZIO XT- Long Term Monitor Instructions   Your physician has requested you wear your ZIO patch monitor___14____days.   This is a single patch monitor.  Irhythm supplies one patch monitor per enrollment.  Additional stickers are not available.   Please do not apply patch if you will be having a Nuclear Stress Test, Echocardiogram, Cardiac CT, MRI, or Chest Xray during the time frame you would be wearing the monitor. The patch cannot be worn during these tests.  You cannot remove and re-apply the ZIO XT patch monitor.   Your ZIO patch monitor will be sent USPS Priority mail from [facility] directly to your home address. The monitor may also be mailed to a PO BOX if home delivery is not available.   It may take 3-5 days to receive your monitor after you have been enrolled.   Once you have received you monitor, please review enclosed instructions.  Your monitor has already been registered assigning a specific monitor serial # to you.   Applying the monitor   Shave hair from upper left chest.   Hold abrader disc by orange tab.  Rub abrader in 40 strokes over left upper chest as indicated in your monitor instructions.   Clean area with 4 enclosed alcohol pads .  Use all pads to assure are is cleaned  thoroughly.  Let dry.   Apply patch as indicated in monitor instructions.  Patch will be place under collarbone on left side of chest with arrow pointing upward.   Rub patch adhesive wings for 2 minutes.Remove white label marked "1".  Remove white label marked "2".  Rub patch adhesive wings for 2 additional minutes.   While looking in a mirror, press and release button in center of patch.  A small green light will flash 3-4 times .  This will be your only indicator  the monitor has been turned on.     Do not shower for the first 24 hours.  You may shower after the first 24 hours.   Press button if you feel a symptom. You will hear a small click.  Record Date, Time and Symptom in the Patient Log Book.   When you are ready to remove patch, follow instructions on last 2 pages of Patient Log Book.  Stick patch monitor onto last page of Patient Log Book.   Place Patient Log Book in [place] box.  Use locking tab on box and tape box closed securely.  The Orange and [employer] has [employer] on it.  Please place in mailbox as soon as possible.  Your physician should have your test results approximately 7 days after the monitor has been mailed back to [facility].   Call [facility] Customer Care at [phone] if you have questions regarding your ZIO XT patch monitor.  Call them immediately if you see an orange light blinking on your monitor.   If your monitor falls off in less than 4 days contact our Monitor department at [phone].  If your monitor becomes loose or falls off after 4 days call [employer] at [phone] for suggestions on securing your monitor.    You have been referred to Cardiac Rehab at [facility]

## 2020-11-15 NOTE — Patient Instructions (Addendum)
  Description   Continue to take 9.5mg  of warfarin daily. Recheck INR in 1 week. Pt has been on warfarin and was followed by PCP, Clementeen Graham. Call coumadin clinic for any changes in medications or upcoming procedures. Coumadin Clinic [phone].   A full discussion of the nature of anticoagulants has been carried out.  A benefit risk analysis has been presented to the patient, so that they understand the justification for choosing anticoagulation at this time. The need for frequent and regular monitoring, precise dosage adjustment and compliance is stressed.  Side effects of potential bleeding are discussed.  The patient should avoid any OTC items containing aspirin or ibuprofen, and should avoid great swings in general diet.  Avoid alcohol consumption.  Call if any signs of abnormal bleeding.

## 2020-11-15 NOTE — Progress Notes (Signed)
Patient ID: Gloria Hamilton, female   DOB: 1995-12-01, 25 y.o.   MRN: [id number] Patient enrolled for [employer] to ship a 14 day ZIO XT long term holter monitor to her home.

## 2020-11-15 NOTE — Progress Notes (Signed)
Received referral from Dr. Shari Prows for this pt to participate in cardiac rehab with the diagnosis of AV Replacement at [facility] 05/2020. Pt previously attempted to participate in cardiac rehab in 06/2020 but was unable to due to resting sinus tachycardia and she had not established with a cardiologist that she planned to continue to see.  Clinical review of pt follow up appt on 2/3 with Dr. Shari Prows - cardiologist office note establishing care.  Also reviewed PCP notes with Clementeen Graham PA.  Pt appropriate for scheduling for on site cardiac rehab and/or enrollment in Virtual Cardiac Rehab.  Will forward to staff for follow up. Alanson Aly, BSN Cardiac and Emergency planning/management officer

## 2020-11-16 ENCOUNTER — Telehealth: Payer: Self-pay

## 2020-11-16 NOTE — Telephone Encounter (Signed)
Left patient a message to return call to the office regarding recent lab results

## 2020-11-16 NOTE — Telephone Encounter (Signed)
-----   Message from Heather E Pemberton, MD sent at 11/16/2020  8:11 AM EST ----- Her thyroid function is normal which is great. Let us know how she feels on the low dose metoprolol.  

## 2020-11-16 NOTE — Telephone Encounter (Signed)
The patient has been notified of the result and verbalized understanding.  All questions (if any) were answered. Leanord Hawking, RN 11/16/2020 8:44 AM

## 2020-11-16 NOTE — Telephone Encounter (Signed)
-----   Message from Meriam Sprague, MD sent at 11/16/2020  8:11 AM EST ----- Her thyroid function is normal which is great. Let us know how she feels on the low dose metoprolol.

## 2020-11-22 ENCOUNTER — Telehealth (HOSPITAL_COMMUNITY): Payer: Self-pay

## 2020-11-22 ENCOUNTER — Ambulatory Visit (INDEPENDENT_AMBULATORY_CARE_PROVIDER_SITE_OTHER): Payer: BC Managed Care – PPO

## 2020-11-22 ENCOUNTER — Other Ambulatory Visit: Payer: Self-pay

## 2020-11-22 DIAGNOSIS — Z952 Presence of prosthetic heart valve: Secondary | ICD-10-CM

## 2020-11-22 DIAGNOSIS — Z5181 Encounter for therapeutic drug level monitoring: Secondary | ICD-10-CM

## 2020-11-22 LAB — POCT INR: INR: 2.5 (ref 2.0–3.0)

## 2020-11-22 NOTE — Telephone Encounter (Signed)
Called patient to see if she was interested in participating in the Cardiac Rehab Program. Patient stated yes. Patient will come in for orientation on 12/25/20 @ 830AM and will attend the 715AM exercise class. Went over insurance, patient verbalized understanding.   Mailed package

## 2020-11-22 NOTE — Telephone Encounter (Signed)
Pt insurance is active and benefits verified through [place]. Co-pay $0.00, DED $1,200.00/$0.00 met, out of pocket $3,200.00/$470.75 met, co-insurance 20%. No pre-authorization required. Passport, 11/22/20 @ 2:35PM, VVY#[id number]  Will contact patient to see if she is interested in the Cardiac Rehab Program.

## 2020-11-22 NOTE — Patient Instructions (Signed)
Description   Continue on same dosage 9.5mg  of warfarin daily. Recheck INR in 2 weeks.  May increase vit K intake weekly.  Call coumadin clinic for any changes in medications or upcoming procedures. Coumadin Clinic [phone].

## 2020-11-23 ENCOUNTER — Other Ambulatory Visit: Payer: Self-pay

## 2020-11-23 MED ORDER — WARFARIN SODIUM 7.5 MG PO TABS
ORAL_TABLET | ORAL | 1 refills | Status: DC
Start: 2020-11-23 — End: 2021-05-09

## 2020-11-23 MED ORDER — WARFARIN SODIUM 1 MG PO TABS
ORAL_TABLET | ORAL | 1 refills | Status: DC
Start: 2020-11-23 — End: 2021-02-11

## 2020-11-23 NOTE — Telephone Encounter (Signed)
Pt is calling requesting a refill on Warfarin. Please address

## 2020-11-27 DIAGNOSIS — F330 Major depressive disorder, recurrent, mild: Secondary | ICD-10-CM | POA: Diagnosis not present

## 2020-12-06 ENCOUNTER — Ambulatory Visit (INDEPENDENT_AMBULATORY_CARE_PROVIDER_SITE_OTHER): Payer: BC Managed Care – PPO | Admitting: *Deleted

## 2020-12-06 ENCOUNTER — Other Ambulatory Visit: Payer: Self-pay

## 2020-12-06 DIAGNOSIS — Z7901 Long term (current) use of anticoagulants: Secondary | ICD-10-CM

## 2020-12-06 DIAGNOSIS — Z952 Presence of prosthetic heart valve: Secondary | ICD-10-CM

## 2020-12-06 LAB — POCT INR: INR: 2.1 (ref 2.0–3.0)

## 2020-12-06 NOTE — Patient Instructions (Signed)
Description   Continue on same dosage 9.5mg  of warfarin daily. Recheck INR in 3 weeks.  Call coumadin clinic for any changes in medications or upcoming procedures. Coumadin Clinic [phone].

## 2020-12-07 DIAGNOSIS — R300 Dysuria: Secondary | ICD-10-CM | POA: Diagnosis not present

## 2020-12-07 DIAGNOSIS — I359 Nonrheumatic aortic valve disorder, unspecified: Secondary | ICD-10-CM | POA: Diagnosis not present

## 2020-12-07 DIAGNOSIS — N3001 Acute cystitis with hematuria: Secondary | ICD-10-CM | POA: Diagnosis not present

## 2020-12-07 DIAGNOSIS — R002 Palpitations: Secondary | ICD-10-CM | POA: Diagnosis not present

## 2020-12-10 DIAGNOSIS — F330 Major depressive disorder, recurrent, mild: Secondary | ICD-10-CM | POA: Diagnosis not present

## 2020-12-21 ENCOUNTER — Telehealth (HOSPITAL_COMMUNITY): Payer: Self-pay

## 2020-12-21 NOTE — Telephone Encounter (Signed)
Cardiac Rehab Medication Review by a Pharmacist  Does the patient  feel that his/her medications are working for him/her?  yes  Has the patient been experiencing any side effects to the medications prescribed?  yes - occasional dizziness upon standing with metoprolol, but denies fainting  Does the patient measure his/her own blood pressure or blood glucose at home?  yes - the patient takes their blood pressure once weekly with readings averaging ~80-100s/70s  Does the patient have any problems obtaining medications due to transportation or finances?   no  Understanding of regimen: good Understanding of indications: good Potential of compliance: good  Pharmacist Interventions: Discussed the importance of rising slowly from sitting or standing while on metoprolol succinate. The patient verbalized understanding.  Sanda Klein, PharmD, RPh  PGY-1 Pharmacy Resident 12/21/2020 4:20 PM  Please check [email or web address] for unit-specific pharmacy phone numbers.

## 2020-12-25 ENCOUNTER — Other Ambulatory Visit: Payer: Self-pay

## 2020-12-25 ENCOUNTER — Encounter (HOSPITAL_COMMUNITY): Payer: Self-pay

## 2020-12-25 ENCOUNTER — Encounter (HOSPITAL_COMMUNITY)
Admission: RE | Admit: 2020-12-25 | Discharge: 2020-12-25 | Disposition: A | Discharge status: Home / Self Care | Payer: BC Managed Care – PPO | Source: Ambulatory Visit | Attending: Cardiology | Admitting: Cardiology

## 2020-12-25 VITALS — BP 102/70 | HR 83 | Ht 64.0 in | Wt 131.4 lb

## 2020-12-25 DIAGNOSIS — Z952 Presence of prosthetic heart valve: Secondary | ICD-10-CM | POA: Insufficient documentation

## 2020-12-25 NOTE — Progress Notes (Signed)
Cardiac Individual Treatment Plan  Patient Details  Name: Gloria Hamilton MRN: [id number] Date of Birth: [birth year] Referring Provider:   Flowsheet Row CARDIAC REHAB PHASE II ORIENTATION from 12/25/2020 in [facility] CARDIAC REHAB  Referring Provider Meriam Sprague, MD      Initial Encounter Date:  Flowsheet Row CARDIAC REHAB PHASE II ORIENTATION from 12/25/2020 in [facility] CARDIAC REHAB  Date 12/25/20      Visit Diagnosis: S/P redo Sternotomy and mechanical Bentall Procedure  Patient's Home Medications on Admission:  Current Outpatient Medications:  .  acetaminophen (TYLENOL) 325 MG tablet, 1 tablet as needed, Disp: , Rfl:  .  aspirin 81 MG chewable tablet, Chew 81 mg by mouth daily., Disp: , Rfl:  .  metoprolol succinate (TOPROL XL) 25 MG 24 hr tablet, Take 0.5 tablets (12.5 mg total) by mouth every evening., Disp: 45 tablet, Rfl: 3 .  norethindrone (CAMILA) 0.35 MG tablet, Take 1 tablet (0.35 mg total) by mouth daily., Disp: 1 Package, Rfl: 5 .  warfarin (COUMADIN) 1 MG tablet, Take 2 tablets ( ) by mouth daily along with your warfarin 7.5mg  tablet  as instructed by the coumadin clinic., Disp: 70 tablet, Rfl: 1 .  warfarin (COUMADIN) 7.5 MG tablet, Take 1 tablet (7.5mg ) by mouth daily as instructed by the coumadin clinic., Disp: 35 tablet, Rfl: 1  Past Medical History: Past Medical History:  Diagnosis Date  . Anxiety   . Bicuspid aortic valve   . CHF (congestive heart failure) (HCC)   . Congenital aortic insufficiency   . H/O aortic valve replacement   . Heart murmur   . Migraines   . Transposition of great vessels     Tobacco Use: Social History   Tobacco Use  Smoking Status Never Smoker  Smokeless Tobacco Never Used    Labs: Recent Review Flowsheet Data   There is no flowsheet data to display.     Capillary Blood Glucose: No results found for: GLUCAP   Exercise Target Goals: Exercise Program Goal: Individual  exercise prescription set using results from initial 6 min walk test and THRR while considering  patient's activity barriers and safety.   Exercise Prescription Goal: Starting with aerobic activity 30 plus minutes a day, 3 days per week for initial exercise prescription. Provide home exercise prescription and guidelines that participant acknowledges understanding prior to discharge.  Activity Barriers & Risk Stratification:  Activity Barriers & Cardiac Risk Stratification - 12/25/20 0836      Activity Barriers & Cardiac Risk Stratification   Activity Barriers None    Cardiac Risk Stratification Moderate           6 Minute Walk:  6 Minute Walk    Row Name 12/25/20 0951         6 Minute Walk   Phase Initial     Distance 1606 feet     Walk Time 6 minutes     # of Rest Breaks 0     MPH 3.04     METS 6.03     RPE 9     Perceived Dyspnea  1     VO2 Peak 21.12     Symptoms Yes (comment)     Comments Patient c/o mild SOB.     Resting HR 83 bpm     Resting BP 102/70     Resting Oxygen Saturation  97 %     Exercise Oxygen Saturation  during 6 min walk 99 %  Max Ex. HR 115 bpm     Max Ex. BP 107/71     2 Minute Post BP 101/70            Oxygen Initial Assessment:   Oxygen Re-Evaluation:   Oxygen Discharge (Final Oxygen Re-Evaluation):   Initial Exercise Prescription:  Initial Exercise Prescription - 12/25/20 0900      Date of Initial Exercise RX and Referring Provider   Date 12/25/20    Referring Provider Meriam Sprague, MD    Expected Discharge Date 02/22/21      Treadmill   MPH 3    Grade 0    Minutes 15    METs 3.3      Bike   Level 2    Minutes 15    METs 3      Prescription Details   Frequency (times per week) 3    Duration Progress to 30 minutes of continuous aerobic without signs/symptoms of physical distress      Intensity   THRR 40-80% of Max Heartrate 78-157    Ratings of Perceived Exertion 11-13    Perceived Dyspnea 0-4       Progression   Progression Continue to progress workloads to maintain intensity without signs/symptoms of physical distress.      Resistance Training   Training Prescription Yes    Weight 2 lbs    Reps 10-15           Perform Capillary Blood Glucose checks as needed.  Exercise Prescription Changes:   Exercise Comments:   Exercise Goals and Review:  Exercise Goals    Row Name 12/25/20 0834             Exercise Goals   Increase Physical Activity Yes       Intervention Provide advice, education, support and counseling about physical activity/exercise needs.;Develop an individualized exercise prescription for aerobic and resistive training based on initial evaluation findings, risk stratification, comorbidities and participant's personal goals.       Expected Outcomes Short Term: Attend rehab on a regular basis to increase amount of physical activity.;Long Term: Exercising regularly at least 3-5 days a week.;Long Term: Add in home exercise to make exercise part of routine and to increase amount of physical activity.       Increase Strength and Stamina Yes       Intervention Provide advice, education, support and counseling about physical activity/exercise needs.;Develop an individualized exercise prescription for aerobic and resistive training based on initial evaluation findings, risk stratification, comorbidities and participant's personal goals.       Expected Outcomes Short Term: Increase workloads from initial exercise prescription for resistance, speed, and METs.;Short Term: Perform resistance training exercises routinely during rehab and add in resistance training at home;Long Term: Improve cardiorespiratory fitness, muscular endurance and strength as measured by increased METs and functional capacity ( )       Able to understand and use rate of perceived exertion (RPE) scale Yes       Intervention Provide education and explanation on how to use RPE scale       Expected  Outcomes Short Term: Able to use RPE daily in rehab to express subjective intensity level;Long Term:  Able to use RPE to guide intensity level when exercising independently       Knowledge and understanding of Target Heart Rate Range (THRR) Yes       Intervention Provide education and explanation of THRR including how the numbers were predicted and where they are  located for reference       Expected Outcomes Short Term: Able to state/look up THRR;Long Term: Able to use THRR to govern intensity when exercising independently;Short Term: Able to use daily as guideline for intensity in rehab       Able to check pulse independently Yes       Intervention Provide education and demonstration on how to check pulse in carotid and radial arteries.;Review the importance of being able to check your own pulse for safety during independent exercise       Expected Outcomes Short Term: Able to explain why pulse checking is important during independent exercise;Long Term: Able to check pulse independently and accurately       Understanding of Exercise Prescription Yes       Intervention Provide education, explanation, and written materials on patient's individual exercise prescription       Expected Outcomes Short Term: Able to explain program exercise prescription;Long Term: Able to explain home exercise prescription to exercise independently              Exercise Goals Re-Evaluation :    Discharge Exercise Prescription (Final Exercise Prescription Changes):   Nutrition:  Target Goals: Understanding of nutrition guidelines, daily intake of sodium 1500mg , cholesterol 200mg , calories 30% from fat and 7% or less from saturated fats, daily to have 5 or more servings of fruits and vegetables.  Biometrics:  Pre Biometrics - 12/25/20 0844      Pre Biometrics   Waist Circumference 27.75 inches    Hip Circumference 38.25 inches    Waist to Hip Ratio 0.73 %    Triceps Skinfold 15 mm    % Body Fat 27.3 %     Grip Strength 26 kg    Flexibility 9.5 in    Single Leg Stand 30 seconds            Nutrition Therapy Plan and Nutrition Goals:   Nutrition Assessments:  MEDIFICTS Score Key:  ?70 Need to make dietary changes   40-70 Heart Healthy Diet  ? 40 Therapeutic Level Cholesterol Diet   Picture Your Plate Scores:  <80 Unhealthy dietary pattern with much room for improvement.  41-50 Dietary pattern unlikely to meet recommendations for good health and room for improvement.  51-60 More healthful dietary pattern, with some room for improvement.   >60 Healthy dietary pattern, although there may be some specific behaviors that could be improved.    Nutrition Goals Re-Evaluation:   Nutrition Goals Discharge (Final Nutrition Goals Re-Evaluation):   Psychosocial: Target Goals: Acknowledge presence or absence of significant depression and/or stress, maximize coping skills, provide positive support system. Participant is able to verbalize types and ability to use techniques and skills needed for reducing stress and depression.  Initial Review & Psychosocial Screening:  Initial Psych Review & Screening - 12/25/20 0933      Initial Review   Current issues with None Identified      Family Dynamics   Good Support System? Yes   Gloria Hamilton has her boyfriend for support and her mother who lives out of town   Comments Gloria Hamilton says that she is feeling better since her surgery in 2021      Barriers   Psychosocial barriers to participate in program The patient should benefit from training in stress management and relaxation.      Screening Interventions   Interventions Encouraged to exercise    Expected Outcomes Long Term Goal: Stressors or current issues are controlled or eliminated.  Quality of Life Scores:  Quality of Life - 12/25/20 0957      Quality of Life   Select Quality of Life      Quality of Life Scores   Health/Function Pre 17.3 %    Socioeconomic Pre 26.79 %     Psych/Spiritual Pre 19.64 %    Family Pre 20.38 %    GLOBAL Pre 20.18 %          Scores of 19 and below usually indicate a poorer quality of life in these areas.  A difference of  2-3 points is a clinically meaningful difference.  A difference of 2-3 points in the total score of the Quality of Life Index has been associated with significant improvement in overall quality of life, self-image, physical symptoms, and general health in studies assessing change in quality of life.  PHQ-9: Recent Review Flowsheet Data    Depression screen [facility] 2/9 12/25/2020 12/25/2020 08/09/2020   Decreased Interest 0 0 0   Down, Depressed, Hopeless 0 0 0   PHQ - 2 Score 0 0 0     Interpretation of Total Score  Total Score Depression Severity:  1-4 = Minimal depression, 5-9 = Mild depression, 10-14 = Moderate depression, 15-19 = Moderately severe depression, 20-27 = Severe depression   Psychosocial Evaluation and Intervention:   Psychosocial Re-Evaluation:   Psychosocial Discharge (Final Psychosocial Re-Evaluation):   Vocational Rehabilitation: Provide vocational rehab assistance to qualifying candidates.   Vocational Rehab Evaluation & Intervention:  Vocational Rehab - 12/25/20 0937      Initial Vocational Rehab Evaluation & Intervention   Assessment shows need for Vocational Rehabilitation No   Gloria Hamilton works full time at [employer]          Education: Education Goals: Education classes will be provided on a weekly basis, covering required topics. Participant will state understanding/return demonstration of topics presented.  Learning Barriers/Preferences:  Learning Barriers/Preferences - 12/25/20 1126      Learning Barriers/Preferences   Learning Barriers Sight;Exercise Concerns   Gloria Hamilton wears glasses. Reports feeling dizzy at times   Learning Preferences Audio;Skilled Demonstration;Verbal Instruction;Computer/Internet;Group Instruction;Video;Written Material;Individual  Instruction;Pictoral           Education Topics: Hypertension, Hypertension Reduction -Define heart disease and high blood pressure. Discus how high blood pressure affects the body and ways to reduce high blood pressure.   Exercise and Your Heart -Discuss why it is important to exercise, the FITT principles of exercise, normal and abnormal responses to exercise, and how to exercise safely.   Angina -Discuss definition of angina, causes of angina, treatment of angina, and how to decrease risk of having angina.   Cardiac Medications -Review what the following cardiac medications are used for, how they affect the body, and side effects that may occur when taking the medications.  Medications include Aspirin, Beta blockers, calcium channel blockers, ACE Inhibitors, angiotensin receptor blockers, diuretics, digoxin, and antihyperlipidemics.   Congestive Heart Failure -Discuss the definition of CHF, how to live with CHF, the signs and symptoms of CHF, and how keep track of weight and sodium intake.   Heart Disease and Intimacy -Discus the effect sexual activity has on the heart, how changes occur during intimacy as we age, and safety during sexual activity.   Smoking Cessation / COPD -Discuss different methods to quit smoking, the health benefits of quitting smoking, and the definition of COPD.   Nutrition I: Fats -Discuss the types of cholesterol, what cholesterol does to the heart, and how  cholesterol levels can be controlled.   Nutrition II: Labels -Discuss the different components of food labels and how to read food label   Heart Parts/Heart Disease and PAD -Discuss the anatomy of the heart, the pathway of blood circulation through the heart, and these are affected by heart disease.   Stress I: Signs and Symptoms -Discuss the causes of stress, how stress may lead to anxiety and depression, and ways to limit stress.   Stress II: Relaxation -Discuss different types of  relaxation techniques to limit stress.   Warning Signs of Stroke / TIA -Discuss definition of a stroke, what the signs and symptoms are of a stroke, and how to identify when someone is having stroke.   Knowledge Questionnaire Score:  Knowledge Questionnaire Score - 12/25/20 0957      Knowledge Questionnaire Score   Pre Score 21/24           Core Components/Risk Factors/Patient Goals at Admission:  Personal Goals and Risk Factors at Admission - 12/25/20 0958      Core Components/Risk Factors/Patient Goals on Admission   Improve shortness of breath with ADL's Yes    Intervention Provide education, individualized exercise plan and daily activity instruction to help decrease symptoms of SOB with activities of daily living.    Expected Outcomes Short Term: Improve cardiorespiratory fitness to achieve a reduction of symptoms when performing ADLs;Long Term: Be able to perform more ADLs without symptoms or delay the onset of symptoms    Heart Failure Yes    Intervention Provide a combined exercise and nutrition program that is supplemented with education, support and counseling about heart failure. Directed toward relieving symptoms such as shortness of breath, decreased exercise tolerance, and extremity edema.    Expected Outcomes Improve functional capacity of life;Short term: Attendance in program 2-3 days a week with increased exercise capacity. Reported lower sodium intake. Reported increased fruit and vegetable intake. Reports medication compliance.;Short term: Daily weights obtained and reported for increase. Utilizing diuretic protocols set by physician.;Long term: Adoption of self-care skills and reduction of barriers for early signs and symptoms recognition and intervention leading to self-care maintenance.    Stress Yes    Intervention Offer individual and/or small group education and counseling on adjustment to heart disease, stress management and health-related lifestyle change.  Teach and support self-help strategies.;Refer participants experiencing significant psychosocial distress to appropriate mental health specialists for further evaluation and treatment. When possible, include family members and significant others in education/counseling sessions.    Expected Outcomes Short Term: Participant demonstrates changes in health-related behavior, relaxation and other stress management skills, ability to obtain effective social support, and compliance with psychotropic medications if prescribed.;Long Term: Emotional wellbeing is indicated by absence of clinically significant psychosocial distress or social isolation.           Core Components/Risk Factors/Patient Goals Review:    Core Components/Risk Factors/Patient Goals at Discharge (Final Review):    ITP Comments:  ITP Comments    Row Name 12/25/20 0844           ITP Comments Dr Armanda Magicraci Turner MD, Medical Director              Comments:Gloria Hamilton  attended orientation on 12/25/2020 to review rules and guidelines for program.  Completed 6 minute walk test, Intitial ITP, and exercise prescription.  VSS. Telemetry-Sinus Rhythm. Gloria Burtonmily reported having mild shortness of breath otherwise  Asymptomatic. Safety measures and social distancing in place per CDC guidelines.Gloria LighterMaria Lilybelle Mayeda, RN,BSN 12/25/2020 11:48 AM

## 2020-12-27 ENCOUNTER — Other Ambulatory Visit: Payer: Self-pay

## 2020-12-27 ENCOUNTER — Ambulatory Visit (INDEPENDENT_AMBULATORY_CARE_PROVIDER_SITE_OTHER): Payer: BC Managed Care – PPO | Admitting: *Deleted

## 2020-12-27 DIAGNOSIS — Z7901 Long term (current) use of anticoagulants: Secondary | ICD-10-CM

## 2020-12-27 DIAGNOSIS — Z5181 Encounter for therapeutic drug level monitoring: Secondary | ICD-10-CM

## 2020-12-27 DIAGNOSIS — Z952 Presence of prosthetic heart valve: Secondary | ICD-10-CM

## 2020-12-27 LAB — POCT INR: INR: 2.2 (ref 2.0–3.0)

## 2020-12-27 NOTE — Patient Instructions (Signed)
Description   Continue taking Warfarin 9.5mg  of warfarin daily. Recheck INR in 4 weeks.  Call coumadin clinic for any changes in medications or upcoming procedures. Coumadin Clinic [phone].

## 2020-12-31 ENCOUNTER — Other Ambulatory Visit: Payer: Self-pay

## 2020-12-31 ENCOUNTER — Encounter (HOSPITAL_COMMUNITY)
Admission: RE | Admit: 2020-12-31 | Discharge: 2020-12-31 | Disposition: A | Discharge status: Home / Self Care | Payer: BC Managed Care – PPO | Source: Ambulatory Visit | Attending: Cardiology | Admitting: Cardiology

## 2020-12-31 DIAGNOSIS — F330 Major depressive disorder, recurrent, mild: Secondary | ICD-10-CM | POA: Diagnosis not present

## 2020-12-31 DIAGNOSIS — Z952 Presence of prosthetic heart valve: Secondary | ICD-10-CM

## 2020-12-31 NOTE — Progress Notes (Signed)
Today was Gloria Hamilton's first exercise session.  Patient tolerated exercise without difficulty. VSS, pre-exercise HR 95 NSR. Medication list reconciled by Gladstone Lighter RN at orientation on 12-25-20.  PHQ score 0. Patient exhibits positive coping skills.  No psychosocial needs identified at this time or interventions needed at this time.Oriented to exercise equipment and gym routine today. Understanding verbalized.

## 2021-01-02 ENCOUNTER — Encounter (HOSPITAL_COMMUNITY)
Admission: RE | Admit: 2021-01-02 | Discharge: 2021-01-02 | Disposition: A | Discharge status: Home / Self Care | Payer: BC Managed Care – PPO | Source: Ambulatory Visit | Attending: Cardiology | Admitting: Cardiology

## 2021-01-02 ENCOUNTER — Other Ambulatory Visit: Payer: Self-pay

## 2021-01-02 DIAGNOSIS — Z952 Presence of prosthetic heart valve: Secondary | ICD-10-CM

## 2021-01-04 ENCOUNTER — Encounter (HOSPITAL_COMMUNITY)
Admission: RE | Admit: 2021-01-04 | Discharge: 2021-01-04 | Disposition: A | Discharge status: Home / Self Care | Payer: BC Managed Care – PPO | Source: Ambulatory Visit | Attending: Cardiology | Admitting: Cardiology

## 2021-01-04 ENCOUNTER — Other Ambulatory Visit: Payer: Self-pay

## 2021-01-04 DIAGNOSIS — Z952 Presence of prosthetic heart valve: Secondary | ICD-10-CM

## 2021-01-06 DIAGNOSIS — N3000 Acute cystitis without hematuria: Secondary | ICD-10-CM | POA: Diagnosis not present

## 2021-01-06 DIAGNOSIS — Z7901 Long term (current) use of anticoagulants: Secondary | ICD-10-CM | POA: Insufficient documentation

## 2021-01-07 ENCOUNTER — Encounter (HOSPITAL_COMMUNITY)
Admission: RE | Admit: 2021-01-07 | Discharge: 2021-01-07 | Disposition: A | Discharge status: Home / Self Care | Payer: BC Managed Care – PPO | Source: Ambulatory Visit | Attending: Cardiology | Admitting: Cardiology

## 2021-01-07 ENCOUNTER — Other Ambulatory Visit: Payer: Self-pay

## 2021-01-07 DIAGNOSIS — Z952 Presence of prosthetic heart valve: Secondary | ICD-10-CM | POA: Diagnosis not present

## 2021-01-07 NOTE — Progress Notes (Signed)
Cardiac Individual Treatment Plan  Patient Details  Name: Gloria Hamilton MRN: [id number] Date of Birth: [birth year] Referring Provider:   Flowsheet Row CARDIAC REHAB PHASE II ORIENTATION from 12/25/2020 in [place]  Referring Provider Freada Bergeron, MD      Initial Encounter Date:  [place] PHASE II ORIENTATION from 12/25/2020 in [place]  Date 12/25/20      Visit Diagnosis: S/P redo Sternotomy and mechanical Bentall Procedure  Patient's Home Medications on Admission:  Current Outpatient Medications:  .  acetaminophen (TYLENOL) 325 MG tablet, 1 tablet as needed, Disp: , Rfl:  .  aspirin 81 MG chewable tablet, Chew 81 mg by mouth daily., Disp: , Rfl:  .  metoprolol succinate (TOPROL XL) 25 MG 24 hr tablet, Take 0.5 tablets (12.5 mg total) by mouth every evening., Disp: 45 tablet, Rfl: 3 .  norethindrone (CAMILA) 0.35 MG tablet, Take 1 tablet (0.35 mg total) by mouth daily., Disp: 1 Package, Rfl: 5 .  warfarin (COUMADIN) 1 MG tablet, Take 2 tablets (84m) by mouth daily along with your warfarin 7.548mtablet  as instructed by the coumadin clinic., Disp: 70 tablet, Rfl: 1 .  warfarin (COUMADIN) 7.5 MG tablet, Take 1 tablet (7.60m56mby mouth daily as instructed by the coumadin clinic., Disp: 35 tablet, Rfl: 1  Past Medical History: Past Medical History:  Diagnosis Date  . Anxiety   . Bicuspid aortic valve   . [facility] [place]estive heart failure[place] Congenital aortic insufficiency   . H/O aortic valve replacement   . Heart murmur   . Migraines   . Transposition of great vessels     Tobacco Use: Social History   Tobacco Use  Smoking Status Never Smoker  Smokeless Tobacco Never Used    Labs: Recent Review Flowsheet Data   There is no flowsheet data to display.     Capillary Blood Glucose: No results found for: GLUCAP   Exercise Target Goals: Exercise Program Goal: Individual  exercise prescription set using results from initial 6 min walk test and THRR while considering  patient's activity barriers and safety.   Exercise Prescription Goal: Initial exercise prescription builds to 30-45 minutes a day of aerobic activity, 2-3 days per week.  Home exercise guidelines will be given to patient during program as part of exercise prescription that the participant will acknowledge.  Activity Barriers & Risk Stratification:  Activity Barriers & Cardiac Risk Stratification - 12/25/20 0836      Activity Barriers & Cardiac Risk Stratification   Activity Barriers None    Cardiac Risk Stratification Moderate           6 Minute Walk:  6 Minute Walk    Row Name 12/25/20 0951         6 Minute Walk   Phase Initial     Distance 1606 feet     Walk Time 6 minutes     # of Rest Breaks 0     MPH 3.04     METS 6.03     RPE 9     Perceived Dyspnea  1     VO2 Peak 21.12     Symptoms Yes (comment)     Comments Patient c/o mild SOB.     Resting HR 83 bpm     Resting BP 102/70     Resting Oxygen Saturation  97 %     Exercise Oxygen Saturation  during 6 min  walk 99 %     Max Ex. HR 115 bpm     Max Ex. BP 107/71     2 Minute Post BP 101/70            Oxygen Initial Assessment:   Oxygen Re-Evaluation:   Oxygen Discharge (Final Oxygen Re-Evaluation):   Initial Exercise Prescription:  Initial Exercise Prescription - 12/25/20 0900      Date of Initial Exercise RX and Referring Provider   Date 12/25/20    Referring Provider Meriam Sprague, MD    Expected Discharge Date 02/22/21      Treadmill   MPH 3    Grade 0    Minutes 15    METs 3.3      Bike   Level 2    Minutes 15    METs 3      Prescription Details   Frequency (times per week) 3    Duration Progress to 30 minutes of continuous aerobic without signs/symptoms of physical distress      Intensity   THRR 40-80% of Max Heartrate 78-157    Ratings of Perceived Exertion 11-13    Perceived  Dyspnea 0-4      Progression   Progression Continue to progress workloads to maintain intensity without signs/symptoms of physical distress.      Resistance Training   Training Prescription Yes    Weight 2 lbs    Reps 10-15           Perform Capillary Blood Glucose checks as needed.  Exercise Prescription Changes:   Exercise Prescription Changes    Row Name 12/31/20 0706             Response to Exercise   Blood Pressure (Admit) 101/68       Blood Pressure (Exercise) 98/68       Blood Pressure (Exit) 101/68       Heart Rate (Admit) 95 bpm       Heart Rate (Exercise) 124 bpm       Heart Rate (Exit) 97 bpm       Rating of Perceived Exertion (Exercise) 12       Symptoms None       Comments Off to a good start with exercise.       Duration Continue with 30 min of aerobic exercise without signs/symptoms of physical distress.       Intensity THRR unchanged               Progression   Progression Continue to progress workloads to maintain intensity without signs/symptoms of physical distress.       Average METs 3.7               Resistance Training   Training Prescription Yes       Weight 2 lbs       Reps 10-15       Time 10 Minutes               Interval Training   Interval Training No               Treadmill   MPH 3       Grade 0       Minutes 10       METs 3.3               Bike   Level 3       Minutes 20       METs  4.2              Exercise Comments:   Exercise Comments    Row Name 12/31/20 7619 01/07/21 0715         Exercise Comments Patient tolerated 1st session of exercise well with some mild shortness of breath with increased exertion. Reviewed home exercise guidelines and goals with patient.             Exercise Goals and Review:   Exercise Goals    Row Name 12/25/20 0834             Exercise Goals   Increase Physical Activity Yes       Intervention Provide advice, education, support and counseling about physical  activity/exercise needs.;Develop an individualized exercise prescription for aerobic and resistive training based on initial evaluation findings, risk stratification, comorbidities and participant's personal goals.       Expected Outcomes Short Term: Attend rehab on a regular basis to increase amount of physical activity.;Long Term: Exercising regularly at least 3-5 days a week.;Long Term: Add in home exercise to make exercise part of routine and to increase amount of physical activity.       Increase Strength and Stamina Yes       Intervention Provide advice, education, support and counseling about physical activity/exercise needs.;Develop an individualized exercise prescription for aerobic and resistive training based on initial evaluation findings, risk stratification, comorbidities and participant's personal goals.       Expected Outcomes Short Term: Increase workloads from initial exercise prescription for resistance, speed, and METs.;Short Term: Perform resistance training exercises routinely during rehab and add in resistance training at home;Long Term: Improve cardiorespiratory fitness, muscular endurance and strength as measured by increased METs and functional capacity ( )       Able to understand and use rate of perceived exertion (RPE) scale Yes       Intervention Provide education and explanation on how to use RPE scale       Expected Outcomes Short Term: Able to use RPE daily in rehab to express subjective intensity level;Long Term:  Able to use RPE to guide intensity level when exercising independently       Knowledge and understanding of Target Heart Rate Range (THRR) Yes       Intervention Provide education and explanation of THRR including how the numbers were predicted and where they are located for reference       Expected Outcomes Short Term: Able to state/look up THRR;Long Term: Able to use THRR to govern intensity when exercising independently;Short Term: Able to use daily as  guideline for intensity in rehab       Able to check pulse independently Yes       Intervention Provide education and demonstration on how to check pulse in carotid and radial arteries.;Review the importance of being able to check your own pulse for safety during independent exercise       Expected Outcomes Short Term: Able to explain why pulse checking is important during independent exercise;Long Term: Able to check pulse independently and accurately       Understanding of Exercise Prescription Yes       Intervention Provide education, explanation, and written materials on patient's individual exercise prescription       Expected Outcomes Short Term: Able to explain program exercise prescription;Long Term: Able to explain home exercise prescription to exercise independently              Exercise Goals Re-Evaluation :  Exercise Goals Re-Evaluation    Row Name 12/31/20 0300 01/07/21 0715           Exercise Goal Re-Evaluation   Exercise Goals Review Able to understand and use rate of perceived exertion (RPE) scale;Increase Physical Activity Able to understand and use rate of perceived exertion (RPE) scale;Increase Physical Activity;Understanding of Exercise Prescription;Knowledge and understanding of Target Heart Rate Range (THRR);Able to check pulse independently;Increase Strength and Stamina      Comments Patient able to understand and use RPE scale appropriately. Patient is not currenlty riding her bike at home, but I encouraged patient to add 2-4 days, 30 minutes riding her bike, and patient is amenable to this. Patient has 3lb weights that she plans to use for her resistance training. Patient has a heart rate monitor and can manually check her pulse. Patient tired today after weekend travel.      Expected Outcomes Increase workloads as tolerated to help improve cardiorespiratory fitness. Patient will ride her stationary bike at home 30 minutes, 2-4 days/week in addition to exercise at  cardiac rehab to achieve 150 minutes of aerobic exercise/ week.             Discharge Exercise Prescription (Final Exercise Prescription Changes):  Exercise Prescription Changes - 12/31/20 0706      Response to Exercise   Blood Pressure (Admit) 101/68    Blood Pressure (Exercise) 98/68    Blood Pressure (Exit) 101/68    Heart Rate (Admit) 95 bpm    Heart Rate (Exercise) 124 bpm    Heart Rate (Exit) 97 bpm    Rating of Perceived Exertion (Exercise) 12    Symptoms None    Comments Off to a good start with exercise.    Duration Continue with 30 min of aerobic exercise without signs/symptoms of physical distress.    Intensity THRR unchanged      Progression   Progression Continue to progress workloads to maintain intensity without signs/symptoms of physical distress.    Average METs 3.7      Resistance Training   Training Prescription Yes    Weight 2 lbs    Reps 10-15    Time 10 Minutes      Interval Training   Interval Training No      Treadmill   MPH 3    Grade 0    Minutes 10    METs 3.3      Bike   Level 3    Minutes 20    METs 4.2           Nutrition:  Target Goals: Understanding of nutrition guidelines, daily intake of sodium 1500mg , cholesterol 200mg , calories 30% from fat and 7% or less from saturated fats, daily to have 5 or more servings of fruits and vegetables.  Biometrics:  Pre Biometrics - 12/25/20 0844      Pre Biometrics   Waist Circumference 27.75 inches    Hip Circumference 38.25 inches    Waist to Hip Ratio 0.73 %    Triceps Skinfold 15 mm    % Body Fat 27.3 %    Grip Strength 26 kg    Flexibility 9.5 in    Single Leg Stand 30 seconds            Nutrition Therapy Plan and Nutrition Goals:  Nutrition Therapy & Goals - 01/07/21 0830      Nutrition Therapy   Diet Low sodium    Drug/Food Interactions Coumadin/Vit K      Personal  Nutrition Goals   Nutrition Goal Pt to build a healthy plate including vegetables, fruits, whole  grains, and low-fat dairy products in a heart healthy meal plan    Personal Goal #2 Pt to reduce sodium intake by cooking at home more often    Personal Goal #3 Pt to continue to have consistent intake of vitamin k rich foods      Intervention Plan   Intervention Prescribe, educate and counsel regarding individualized specific dietary modifications aiming towards targeted core components such as weight, hypertension, lipid management, diabetes, heart failure and other comorbidities.;Nutrition handout(s) given to patient.    Expected Outcomes Short Term Goal: Understand basic principles of dietary content, such as calories, fat, sodium, cholesterol and nutrients.           Nutrition Assessments:  MEDIFICTS Score Key:  ?70 Need to make dietary changes   40-70 Heart Healthy Diet  ? 40 Therapeutic Level Cholesterol Diet   Flowsheet Row CARDIAC REHAB PHASE II EXERCISE from 01/07/2021 in [facility] CARDIAC REHAB  Picture Your Plate Total Score on Admission 55     Picture Your Plate Scores:  <16 Unhealthy dietary pattern with much room for improvement.  41-50 Dietary pattern unlikely to meet recommendations for good health and room for improvement.  51-60 More healthful dietary pattern, with some room for improvement.   >60 Healthy dietary pattern, although there may be some specific behaviors that could be improved.    Nutrition Goals Re-Evaluation:  Nutrition Goals Re-Evaluation    Row Name 01/07/21 0831             Goals   Current Weight 131 lb (59.4 kg)       Nutrition Goal Pt to build a healthy plate including vegetables, fruits, whole grains, and low-fat dairy products in a heart healthy meal plan               Personal Goal #2 Re-Evaluation   Personal Goal #2 Pt to reduce sodium intake by cooking at home more often               Personal Goal #3 Re-Evaluation   Personal Goal #3 Pt to continue to have consistent intake of vitamin k rich foods               Nutrition Goals Re-Evaluation:  Nutrition Goals Re-Evaluation    Row Name 01/07/21 0831             Goals   Current Weight 131 lb (59.4 kg)       Nutrition Goal Pt to build a healthy plate including vegetables, fruits, whole grains, and low-fat dairy products in a heart healthy meal plan               Personal Goal #2 Re-Evaluation   Personal Goal #2 Pt to reduce sodium intake by cooking at home more often               Personal Goal #3 Re-Evaluation   Personal Goal #3 Pt to continue to have consistent intake of vitamin k rich foods              Nutrition Goals Discharge (Final Nutrition Goals Re-Evaluation):  Nutrition Goals Re-Evaluation - 01/07/21 0831      Goals   Current Weight 131 lb (59.4 kg)    Nutrition Goal Pt to build a healthy plate including vegetables, fruits, whole grains, and low-fat dairy products in a heart healthy meal plan  Personal Goal #2 Re-Evaluation   Personal Goal #2 Pt to reduce sodium intake by cooking at home more often      Personal Goal #3 Re-Evaluation   Personal Goal #3 Pt to continue to have consistent intake of vitamin k rich foods           Psychosocial: Target Goals: Acknowledge presence or absence of significant depression and/or stress, maximize coping skills, provide positive support system. Participant is able to verbalize types and ability to use techniques and skills needed for reducing stress and depression.  Initial Review & Psychosocial Screening:  Initial Psych Review & Screening - 12/25/20 0933      Initial Review   Current issues with None Identified      Family Dynamics   Good Support System? Yes   Tinnie has her boyfriend for support and her mother who lives out of town   Comments Koa says that she is feeling better since her surgery in 2021      Barriers   Psychosocial barriers to participate in program The patient should benefit from training in stress management and relaxation.       Screening Interventions   Interventions Encouraged to exercise    Expected Outcomes Long Term Goal: Stressors or current issues are controlled or eliminated.           Quality of Life Scores:  Quality of Life - 12/25/20 0957      Quality of Life   Select Quality of Life      Quality of Life Scores   Health/Function Pre 17.3 %    Socioeconomic Pre 26.79 %    Psych/Spiritual Pre 19.64 %    Family Pre 20.38 %    GLOBAL Pre 20.18 %          Scores of 19 and below usually indicate a poorer quality of life in these areas.  A difference of  2-3 points is a clinically meaningful difference.  A difference of 2-3 points in the total score of the Quality of Life Index has been associated with significant improvement in overall quality of life, self-image, physical symptoms, and general health in studies assessing change in quality of life.  PHQ-9: Recent Review Flowsheet Data    Depression screen [facility] 2/9 12/25/2020 12/25/2020 08/09/2020   Decreased Interest 0 0 0   Down, Depressed, Hopeless 0 0 0   PHQ - 2 Score 0 0 0     Interpretation of Total Score  Total Score Depression Severity:  1-4 = Minimal depression, 5-9 = Mild depression, 10-14 = Moderate depression, 15-19 = Moderately severe depression, 20-27 = Severe depression   Psychosocial Evaluation and Intervention:   Psychosocial Re-Evaluation:   Psychosocial Discharge (Final Psychosocial Re-Evaluation):   Vocational Rehabilitation: Provide vocational rehab assistance to qualifying candidates.   Vocational Rehab Evaluation & Intervention:  Vocational Rehab - 12/25/20 0937      Initial Vocational Rehab Evaluation & Intervention   Assessment shows need for Vocational Rehabilitation No   Majesti works full time at the [employer]          Education: Education Goals: Education classes will be provided on a weekly basis, covering required topics. Participant will state understanding/return demonstration of topics  presented.  Learning Barriers/Preferences:  Learning Barriers/Preferences - 12/25/20 1126      Learning Barriers/Preferences   Learning Barriers Sight;Exercise Concerns   Latressa wears glasses. Reports feeling dizzy at times   Learning Preferences Audio;Skilled Demonstration;Verbal Instruction;Computer/Internet;Group Instruction;Video;Written Material;Individual Instruction;Pictoral  Education Topics: Count Your Pulse:  -Group instruction provided by verbal instruction, demonstration, patient participation and written materials to support subject.  Instructors address importance of being able to find your pulse and how to count your pulse when at home without a heart monitor.  Patients get hands on experience counting their pulse with staff help and individually.   Heart Attack, Angina, and Risk Factor Modification:  -Group instruction provided by verbal instruction, video, and written materials to support subject.  Instructors address signs and symptoms of angina and heart attacks.    Also discuss risk factors for heart disease and how to make changes to improve heart health risk factors.   Functional Fitness:  -Group instruction provided by verbal instruction, demonstration, patient participation, and written materials to support subject.  Instructors address safety measures for doing things around the house.  Discuss how to get up and down off the floor, how to pick things up properly, how to safely get out of a chair without assistance, and balance training.   Meditation and Mindfulness:  -Group instruction provided by verbal instruction, patient participation, and written materials to support subject.  Instructor addresses importance of mindfulness and meditation practice to help reduce stress and improve awareness.  Instructor also leads participants through a meditation exercise.    Stretching for Flexibility and Mobility:  -Group instruction provided by verbal  instruction, patient participation, and written materials to support subject.  Instructors lead participants through series of stretches that are designed to increase flexibility thus improving mobility.  These stretches are additional exercise for major muscle groups that are typically performed during regular warm up and cool down.   Hands Only CPR:  -Group verbal, video, and participation provides a basic overview of AHA guidelines for community CPR. Role-play of emergencies allow participants the opportunity to practice calling for help and chest compression technique with discussion of AED use.   Hypertension: -Group verbal and written instruction that provides a basic overview of hypertension including the most recent diagnostic guidelines, risk factor reduction with self-care instructions and medication management.    Nutrition I class: Heart Healthy Eating:  -Group instruction provided by PowerPoint slides, verbal discussion, and written materials to support subject matter. The instructor gives an explanation and review of the Therapeutic Lifestyle Changes diet recommendations, which includes a discussion on lipid goals, dietary fat, sodium, fiber, plant stanol/sterol esters, sugar, and the components of a well-balanced, healthy diet.   Nutrition II class: Lifestyle Skills:  -Group instruction provided by PowerPoint slides, verbal discussion, and written materials to support subject matter. The instructor gives an explanation and review of label reading, grocery shopping for heart health, heart healthy recipe modifications, and ways to make healthier choices when eating out.   Diabetes Question & Answer:  -Group instruction provided by PowerPoint slides, verbal discussion, and written materials to support subject matter. The instructor gives an explanation and review of diabetes co-morbidities, pre- and post-prandial blood glucose goals, pre-exercise blood glucose goals, signs, symptoms,  and treatment of hypoglycemia and hyperglycemia, and foot care basics.   Diabetes Blitz:  -Group instruction provided by PowerPoint slides, verbal discussion, and written materials to support subject matter. The instructor gives an explanation and review of the physiology behind type 1 and type 2 diabetes, diabetes medications and rational behind using different medications, pre- and post-prandial blood glucose recommendations and Hemoglobin A1c goals, diabetes diet, and exercise including blood glucose guidelines for exercising safely.    Portion Distortion:  -Group instruction provided by PowerPoint slides,  verbal discussion, written materials, and food models to support subject matter. The instructor gives an explanation of serving size versus portion size, changes in portions sizes over the last 20 years, and what consists of a serving from each food group.   Stress Management:  -Group instruction provided by verbal instruction, video, and written materials to support subject matter.  Instructors review role of stress in heart disease and how to cope with stress positively.     Exercising on Your Own:  -Group instruction provided by verbal instruction, power point, and written materials to support subject.  Instructors discuss benefits of exercise, components of exercise, frequency and intensity of exercise, and end points for exercise.  Also discuss use of nitroglycerin and activating EMS.  Review options of places to exercise outside of rehab.  Review guidelines for sex with heart disease.   Cardiac Drugs I:  -Group instruction provided by verbal instruction and written materials to support subject.  Instructor reviews cardiac drug classes: antiplatelets, anticoagulants, beta blockers, and statins.  Instructor discusses reasons, side effects, and lifestyle considerations for each drug class.   Cardiac Drugs II:  -Group instruction provided by verbal instruction and written materials to  support subject.  Instructor reviews cardiac drug classes: angiotensin converting enzyme inhibitors (ACE-I), angiotensin II receptor blockers (ARBs), nitrates, and calcium channel blockers.  Instructor discusses reasons, side effects, and lifestyle considerations for each drug class.   Anatomy and Physiology of the Circulatory System:  Group verbal and written instruction and models provide basic cardiac anatomy and physiology, with the coronary electrical and arterial systems. Review of: AMI, Angina, Valve disease, Heart Failure, Peripheral Artery Disease, Cardiac Arrhythmia, Pacemakers, and the ICD.   Other Education:  -Group or individual verbal, written, or video instructions that support the educational goals of the cardiac rehab program.   Holiday Eating Survival Tips:  -Group instruction provided by PowerPoint slides, verbal discussion, and written materials to support subject matter. The instructor gives patients tips, tricks, and techniques to help them not only survive but enjoy the holidays despite the onslaught of food that accompanies the holidays.   Knowledge Questionnaire Score:  Knowledge Questionnaire Score - 12/25/20 0957      Knowledge Questionnaire Score   Pre Score 21/24           Core Components/Risk Factors/Patient Goals at Admission:  Personal Goals and Risk Factors at Admission - 12/25/20 0958      Core Components/Risk Factors/Patient Goals on Admission   Improve shortness of breath with ADL's Yes    Intervention Provide education, individualized exercise plan and daily activity instruction to help decrease symptoms of SOB with activities of daily living.    Expected Outcomes Short Term: Improve cardiorespiratory fitness to achieve a reduction of symptoms when performing ADLs;Long Term: Be able to perform more ADLs without symptoms or delay the onset of symptoms    Heart Failure Yes    Intervention Provide a combined exercise and nutrition program that is  supplemented with education, support and counseling about heart failure. Directed toward relieving symptoms such as shortness of breath, decreased exercise tolerance, and extremity edema.    Expected Outcomes Improve functional capacity of life;Short term: Attendance in program 2-3 days a week with increased exercise capacity. Reported lower sodium intake. Reported increased fruit and vegetable intake. Reports medication compliance.;Short term: Daily weights obtained and reported for increase. Utilizing diuretic protocols set by physician.;Long term: Adoption of self-care skills and reduction of barriers for early signs and symptoms recognition and intervention  leading to self-care maintenance.    Stress Yes    Intervention Offer individual and/or small group education and counseling on adjustment to heart disease, stress management and health-related lifestyle change. Teach and support self-help strategies.;Refer participants experiencing significant psychosocial distress to appropriate mental health specialists for further evaluation and treatment. When possible, include family members and significant others in education/counseling sessions.    Expected Outcomes Short Term: Participant demonstrates changes in health-related behavior, relaxation and other stress management skills, ability to obtain effective social support, and compliance with psychotropic medications if prescribed.;Long Term: Emotional wellbeing is indicated by absence of clinically significant psychosocial distress or social isolation.           Core Components/Risk Factors/Patient Goals Review:   Goals and Risk Factor Review    Row Name 01/03/21 1215             Core Components/Risk Factors/Patient Goals Review   Personal Goals Review Weight Management/Obesity;Improve shortness of breath with ADL's;Heart Failure       Review Abra started exercise on 12/31/20 and is off to a good start to exercise       Expected Outcomes  Klaudia will continue to participate in phase 2 cardiac rehab for exercise nutrtion and lifestyle modifications              Core Components/Risk Factors/Patient Goals at Discharge (Final Review):   Goals and Risk Factor Review - 01/03/21 1215      Core Components/Risk Factors/Patient Goals Review   Personal Goals Review Weight Management/Obesity;Improve shortness of breath with ADL's;Heart Failure    Review Jaziah started exercise on 12/31/20 and is off to a good start to exercise    Expected Outcomes Aletheia will continue to participate in phase 2 cardiac rehab for exercise nutrtion and lifestyle modifications           ITP Comments:  ITP Comments    Row Name 12/25/20 0844 01/03/21 1213         ITP Comments Dr Armanda Magic MD, Medical Director 30 Day ITP Review. Shakemia is off to a good start to exercise             Comments: See ITP comments.

## 2021-01-07 NOTE — Progress Notes (Signed)
Reviewed home exercise guidelines with patient including endpoints, temperature precautions, target heart rate and rate of perceived exertion. Patient plans to ride her stationary bike as her mode of home exercise. Patient voices understanding of instructions given. ° °Yanni Quiroa M Euretha Najarro, MS, ACSM CEP ° ° °

## 2021-01-07 NOTE — Progress Notes (Signed)
Pincus Sanes 25 y.o. female Nutrition Note  Diagnosis: AVR  Past Medical History:  Diagnosis Date  . Anxiety   . Bicuspid aortic valve   . CHF (congestive heart failure) (HCC)   . Congenital aortic insufficiency   . H/O aortic valve replacement   . Heart murmur   . Migraines   . Transposition of great vessels      Medications reviewed.   Current Outpatient Medications:  .  acetaminophen (TYLENOL) 325 MG tablet, 1 tablet as needed, Disp: , Rfl:  .  aspirin 81 MG chewable tablet, Chew 81 mg by mouth daily., Disp: , Rfl:  .  metoprolol succinate (TOPROL XL) 25 MG 24 hr tablet, Take 0.5 tablets (12.5 mg total) by mouth every evening., Disp: 45 tablet, Rfl: 3 .  norethindrone (CAMILA) 0.35 MG tablet, Take 1 tablet (0.35 mg total) by mouth daily., Disp: 1 Package, Rfl: 5 .  warfarin (COUMADIN) 1 MG tablet, Take 2 tablets (2mg ) by mouth daily along with your warfarin 7.5mg  tablet  as instructed by the coumadin clinic., Disp: 70 tablet, Rfl: 1 .  warfarin (COUMADIN) 7.5 MG tablet, Take 1 tablet (7.5mg ) by mouth daily as instructed by the coumadin clinic., Disp: 35 tablet, Rfl: 1   Ht Readings from Last 1 Encounters:  12/25/20 5\' 4"  (1.626 m)     Wt Readings from Last 3 Encounters:  12/25/20 131 lb 6.3 oz (59.6 kg)  11/15/20 128 lb 12.8 oz (58.4 kg)  08/09/20 125 lb (56.7 kg)     There is no height or weight on file to calculate BMI.   Social History   Tobacco Use  Smoking Status Never Smoker  Smokeless Tobacco Never Used     No results found for: CHOL No results found for: HDL No results found for: LDLCALC No results found for: TRIG   No results found for: HGBA1C   CBG (last 3)  No results for input(s): GLUCAP in the last 72 hours.   Nutrition Note  Spoke with pt. Nutrition Plan and Nutrition Survey goals reviewed with pt. Pt is following a Heart Healthy diet. She tries to incorporate fruits, vegetables, healthy fats, and whole grains.  She was tracking vitamin  K intake when she got out of the hospital. She was drinking a protein shake and a kiwi daily. We discussed the high k foods to keep consistent.Provided handout.    Per discussion, pt does not use canned/convenience foods often. Pt does add salt to food. Pt does eat out frequently. Pt wants to cook more foods at home. Provided pt with easy vegetarian recipes. Reviewed sodium recommendations and label reading.   Pt expressed understanding of the information reviewed.    Nutrition Diagnosis ? Food-and nutrition-related knowledge deficit related to lack of exposure to information as related to diagnosis of: ? CVD ?  Nutrition Intervention ? Pt's individual nutrition plan reviewed with pt. ? Plant based, low sodium, mostly vegetarian diet.   ? Pt given handouts for: ? Nutrition I class ? Nutrition II class ? ? Continue client-centered nutrition education by RD, as part of interdisciplinary care.  Goal(s) ? Pt to build a healthy plate including vegetables, fruits, whole grains, and low-fat dairy products in a heart healthy meal plan. ? Pt to reduce sodium intake by cooking at home more often ? Pt to continue to have consistent intake of vitamin k rich foods  Plan:   Will provide client-centered nutrition education as part of interdisciplinary care  Monitor and evaluate progress  toward nutrition goal with team.   Michaele Offer, MS, RDN, LDN

## 2021-01-09 ENCOUNTER — Encounter (HOSPITAL_COMMUNITY)
Admission: RE | Admit: 2021-01-09 | Discharge: 2021-01-09 | Disposition: A | Discharge status: Home / Self Care | Payer: BC Managed Care – PPO | Source: Ambulatory Visit | Attending: Cardiology | Admitting: Cardiology

## 2021-01-09 ENCOUNTER — Other Ambulatory Visit: Payer: Self-pay

## 2021-01-09 DIAGNOSIS — Z952 Presence of prosthetic heart valve: Secondary | ICD-10-CM

## 2021-01-11 ENCOUNTER — Other Ambulatory Visit: Payer: Self-pay

## 2021-01-11 ENCOUNTER — Encounter (HOSPITAL_COMMUNITY)
Admission: RE | Admit: 2021-01-11 | Discharge: 2021-01-11 | Disposition: A | Discharge status: Home / Self Care | Payer: BC Managed Care – PPO | Source: Ambulatory Visit | Attending: Cardiology | Admitting: Cardiology

## 2021-01-11 DIAGNOSIS — Z952 Presence of prosthetic heart valve: Secondary | ICD-10-CM | POA: Insufficient documentation

## 2021-01-14 ENCOUNTER — Encounter (HOSPITAL_COMMUNITY)
Admission: RE | Admit: 2021-01-14 | Discharge: 2021-01-14 | Disposition: A | Discharge status: Home / Self Care | Payer: BC Managed Care – PPO | Source: Ambulatory Visit | Attending: Cardiology | Admitting: Cardiology

## 2021-01-14 ENCOUNTER — Other Ambulatory Visit: Payer: Self-pay

## 2021-01-14 DIAGNOSIS — Z952 Presence of prosthetic heart valve: Secondary | ICD-10-CM

## 2021-01-16 ENCOUNTER — Other Ambulatory Visit: Payer: Self-pay

## 2021-01-16 ENCOUNTER — Encounter (HOSPITAL_COMMUNITY)
Admission: RE | Admit: 2021-01-16 | Discharge: 2021-01-16 | Disposition: A | Discharge status: Home / Self Care | Payer: BC Managed Care – PPO | Source: Ambulatory Visit | Attending: Cardiology | Admitting: Cardiology

## 2021-01-16 DIAGNOSIS — Z952 Presence of prosthetic heart valve: Secondary | ICD-10-CM

## 2021-01-18 ENCOUNTER — Other Ambulatory Visit: Payer: Self-pay

## 2021-01-18 ENCOUNTER — Encounter (HOSPITAL_COMMUNITY)
Admission: RE | Admit: 2021-01-18 | Discharge: 2021-01-18 | Disposition: A | Discharge status: Home / Self Care | Payer: BC Managed Care – PPO | Source: Ambulatory Visit | Attending: Cardiology | Admitting: Cardiology

## 2021-01-18 DIAGNOSIS — Z952 Presence of prosthetic heart valve: Secondary | ICD-10-CM | POA: Diagnosis not present

## 2021-01-18 DIAGNOSIS — F330 Major depressive disorder, recurrent, mild: Secondary | ICD-10-CM | POA: Diagnosis not present

## 2021-01-21 ENCOUNTER — Other Ambulatory Visit: Payer: Self-pay

## 2021-01-21 ENCOUNTER — Encounter (HOSPITAL_COMMUNITY)
Admission: RE | Admit: 2021-01-21 | Discharge: 2021-01-21 | Disposition: A | Discharge status: Home / Self Care | Payer: BC Managed Care – PPO | Source: Ambulatory Visit | Attending: Cardiology | Admitting: Cardiology

## 2021-01-21 DIAGNOSIS — Z952 Presence of prosthetic heart valve: Secondary | ICD-10-CM

## 2021-01-23 ENCOUNTER — Other Ambulatory Visit: Payer: Self-pay

## 2021-01-23 ENCOUNTER — Encounter (HOSPITAL_COMMUNITY)
Admission: RE | Admit: 2021-01-23 | Discharge: 2021-01-23 | Disposition: A | Discharge status: Home / Self Care | Payer: BC Managed Care – PPO | Source: Ambulatory Visit | Attending: Cardiology | Admitting: Cardiology

## 2021-01-23 DIAGNOSIS — Z952 Presence of prosthetic heart valve: Secondary | ICD-10-CM | POA: Diagnosis not present

## 2021-01-24 ENCOUNTER — Ambulatory Visit (INDEPENDENT_AMBULATORY_CARE_PROVIDER_SITE_OTHER): Payer: BC Managed Care – PPO | Admitting: Pharmacist

## 2021-01-24 DIAGNOSIS — Z952 Presence of prosthetic heart valve: Secondary | ICD-10-CM | POA: Diagnosis not present

## 2021-01-24 DIAGNOSIS — Z5181 Encounter for therapeutic drug level monitoring: Secondary | ICD-10-CM | POA: Diagnosis not present

## 2021-01-24 DIAGNOSIS — Z7901 Long term (current) use of anticoagulants: Secondary | ICD-10-CM | POA: Diagnosis not present

## 2021-01-24 LAB — POCT INR: INR: 1.4 — AB (ref 2.0–3.0)

## 2021-01-24 NOTE — Patient Instructions (Signed)
Take 1.5 tablets of the 7.5mg  tablets and 3 tablets of the 1mg  for a total of 14.25mg  today and tomorrow then continue taking Warfarin 9.5mg  of warfarin daily. Recheck INR in 1 weeks.  Call coumadin clinic for any changes in medications or upcoming procedures. [facility] [phone].

## 2021-01-25 ENCOUNTER — Encounter (HOSPITAL_COMMUNITY)
Admission: RE | Admit: 2021-01-25 | Discharge: 2021-01-25 | Disposition: A | Discharge status: Home / Self Care | Payer: BC Managed Care – PPO | Source: Ambulatory Visit | Attending: Cardiology | Admitting: Cardiology

## 2021-01-25 ENCOUNTER — Other Ambulatory Visit: Payer: Self-pay

## 2021-01-25 DIAGNOSIS — Z952 Presence of prosthetic heart valve: Secondary | ICD-10-CM

## 2021-01-28 ENCOUNTER — Encounter (HOSPITAL_COMMUNITY)
Admission: RE | Admit: 2021-01-28 | Discharge: 2021-01-28 | Disposition: A | Discharge status: Home / Self Care | Payer: BC Managed Care – PPO | Source: Ambulatory Visit | Attending: Cardiology | Admitting: Cardiology

## 2021-01-28 ENCOUNTER — Other Ambulatory Visit: Payer: Self-pay

## 2021-01-28 DIAGNOSIS — Z952 Presence of prosthetic heart valve: Secondary | ICD-10-CM | POA: Diagnosis not present

## 2021-01-29 DIAGNOSIS — M546 Pain in thoracic spine: Secondary | ICD-10-CM | POA: Diagnosis not present

## 2021-01-30 ENCOUNTER — Encounter (HOSPITAL_COMMUNITY): Payer: BC Managed Care – PPO

## 2021-01-30 ENCOUNTER — Telehealth (HOSPITAL_COMMUNITY): Payer: Self-pay | Admitting: *Deleted

## 2021-01-31 ENCOUNTER — Other Ambulatory Visit: Payer: Self-pay

## 2021-01-31 ENCOUNTER — Ambulatory Visit (INDEPENDENT_AMBULATORY_CARE_PROVIDER_SITE_OTHER): Payer: BC Managed Care – PPO | Admitting: *Deleted

## 2021-01-31 DIAGNOSIS — Z952 Presence of prosthetic heart valve: Secondary | ICD-10-CM | POA: Diagnosis not present

## 2021-01-31 DIAGNOSIS — M546 Pain in thoracic spine: Secondary | ICD-10-CM | POA: Diagnosis not present

## 2021-01-31 DIAGNOSIS — Z5181 Encounter for therapeutic drug level monitoring: Secondary | ICD-10-CM

## 2021-01-31 DIAGNOSIS — Z7901 Long term (current) use of anticoagulants: Secondary | ICD-10-CM

## 2021-01-31 LAB — POCT INR: INR: 2.5 (ref 2.0–3.0)

## 2021-01-31 NOTE — Patient Instructions (Signed)
Description   Continue taking Warfarin 9.5mg  of warfarin daily. Recheck INR in 3 weeks.  Call coumadin clinic for any changes in medications or upcoming procedures. [facility] [phone].

## 2021-02-01 ENCOUNTER — Encounter (HOSPITAL_COMMUNITY)
Admission: RE | Admit: 2021-02-01 | Discharge: 2021-02-01 | Disposition: A | Discharge status: Home / Self Care | Payer: BC Managed Care – PPO | Source: Ambulatory Visit | Attending: Cardiology | Admitting: Cardiology

## 2021-02-01 DIAGNOSIS — Z952 Presence of prosthetic heart valve: Secondary | ICD-10-CM

## 2021-02-01 DIAGNOSIS — H538 Other visual disturbances: Secondary | ICD-10-CM | POA: Diagnosis not present

## 2021-02-03 DIAGNOSIS — D3101 Benign neoplasm of right conjunctiva: Secondary | ICD-10-CM | POA: Diagnosis not present

## 2021-02-04 ENCOUNTER — Other Ambulatory Visit: Payer: Self-pay

## 2021-02-04 ENCOUNTER — Telehealth (HOSPITAL_COMMUNITY): Payer: Self-pay | Admitting: *Deleted

## 2021-02-04 ENCOUNTER — Encounter (HOSPITAL_COMMUNITY)
Admission: RE | Admit: 2021-02-04 | Discharge: 2021-02-04 | Disposition: A | Discharge status: Home / Self Care | Payer: BC Managed Care – PPO | Source: Ambulatory Visit | Attending: Cardiology | Admitting: Cardiology

## 2021-02-04 DIAGNOSIS — Z952 Presence of prosthetic heart valve: Secondary | ICD-10-CM

## 2021-02-04 NOTE — Progress Notes (Signed)
Exercise held today. Assyria reports ongoing vision disturbance since having a migraine episode on 01-30-21 for which she called out for Cardiac rehab that day. She reported the vision issues on Friday 02-01-2021 at cardiac rehab and she was instructed to follow-up with her PCP since vision improvement did not resolve with migraine resolving. Patient tolerated exercise Friday without any complaints other than the visual disturbance.  She reports today concerns that the issue with vision continues. She was not able to get in to see her PCP Friday but did do the Urgent Care with no findings. She also reports going to [facility] for an eye exam which there were no findings.  Jenelle was instructed to call her PCP again and communicate the issues and concerns she has and request to be seen by the Physician as soon as possible. Understanding verbalized.

## 2021-02-04 NOTE — Progress Notes (Signed)
Cardiac Individual Treatment Plan  Patient Details  Name: Gloria Hamilton MRN: [id number] Date of Birth: [birth year] Referring Provider:   Flowsheet Row CARDIAC REHAB PHASE II ORIENTATION from 12/25/2020 in [place]  Referring Provider Freada Bergeron, MD      Initial Encounter Date:  [place] PHASE II ORIENTATION from 12/25/2020 in [place]  Date 12/25/20      Visit Diagnosis: S/P redo Sternotomy and mechanical [place] Procedure  Patient's Home Medications on Admission:  Current Outpatient Medications:  .  acetaminophen (TYLENOL) 325 MG tablet, 1 tablet as needed, Disp: , Rfl:  .  aspirin 81 MG chewable tablet, Chew 81 mg by mouth daily., Disp: , Rfl:  .  metoprolol succinate (TOPROL XL) 25 MG 24 hr tablet, Take 0.5 tablets (12.5 mg total) by mouth every evening., Disp: 45 tablet, Rfl: 3 .  norethindrone (CAMILA) 0.35 MG tablet, Take 1 tablet (0.35 mg total) by mouth daily., Disp: 1 Package, Rfl: 5 .  warfarin (COUMADIN) 1 MG tablet, Take 2 tablets (84m) by mouth daily along with your warfarin 7.548mtablet  as instructed by the coumadin clinic., Disp: 70 tablet, Rfl: 1 .  warfarin (COUMADIN) 7.5 MG tablet, Take 1 tablet (7.60m56mby mouth daily as instructed by the coumadin clinic., Disp: 35 tablet, Rfl: 1  Past Medical History: Past Medical History:  Diagnosis Date  . Anxiety   . Bicuspid aortic valve   . [facility] [place]estive heart failure[place] Congenital aortic insufficiency   . H/O aortic valve replacement   . Heart murmur   . Migraines   . Transposition of great vessels     Tobacco Use: Social History   Tobacco Use  Smoking Status Never Smoker  Smokeless Tobacco Never Used    Labs: Recent Review Flowsheet Data   There is no flowsheet data to display.     Capillary Blood Glucose: No results found for: GLUCAP   Exercise Target Goals: Exercise Program Goal: Individual  exercise prescription set using results from initial 6 min walk test and THRR while considering  patient's activity barriers and safety.   Exercise Prescription Goal: Initial exercise prescription builds to 30-45 minutes a day of aerobic activity, 2-3 days per week.  Home exercise guidelines will be given to patient during program as part of exercise prescription that the participant will acknowledge.  Activity Barriers & Risk Stratification:  Activity Barriers & Cardiac Risk Stratification - 12/25/20 0836      Activity Barriers & Cardiac Risk Stratification   Activity Barriers None    Cardiac Risk Stratification Moderate           6 Minute Walk:  6 Minute Walk    Row Name 12/25/20 0951         6 Minute Walk   Phase Initial     Distance 1606 feet     Walk Time 6 minutes     # of Rest Breaks 0     MPH 3.04     METS 6.03     RPE 9     Perceived Dyspnea  1     VO2 Peak 21.12     Symptoms Yes (comment)     Comments Patient c/o mild SOB.     Resting HR 83 bpm     Resting BP 102/70     Resting Oxygen Saturation  97 %     Exercise Oxygen Saturation  during 6 min  walk 99 %     Max Ex. HR 115 bpm     Max Ex. BP 107/71     2 Minute Post BP 101/70            Oxygen Initial Assessment:   Oxygen Re-Evaluation:   Oxygen Discharge (Final Oxygen Re-Evaluation):   Initial Exercise Prescription:  Initial Exercise Prescription - 12/25/20 0900      Date of Initial Exercise RX and Referring Provider   Date 12/25/20    Referring Provider Freada Bergeron, MD    Expected Discharge Date 02/22/21      Treadmill   MPH 3    Grade 0    Minutes 15    METs 3.3      Bike   Level 2    Minutes 15    METs 3      Prescription Details   Frequency (times per week) 3    Duration Progress to 30 minutes of continuous aerobic without signs/symptoms of physical distress      Intensity   THRR 40-80% of Max Heartrate 78-157    Ratings of Perceived Exertion 11-13    Perceived  Dyspnea 0-4      Progression   Progression Continue to progress workloads to maintain intensity without signs/symptoms of physical distress.      Resistance Training   Training Prescription Yes    Weight 2 lbs    Reps 10-15           Perform Capillary Blood Glucose checks as needed.  Exercise Prescription Changes:   Exercise Prescription Changes    Row Name 12/31/20 0706 01/14/21 1628 01/28/21 0711         Response to Exercise   Blood Pressure (Admit) 101/68 101/68 94/62     Blood Pressure (Exercise) 98/68 105/73 100/71     Blood Pressure (Exit) 101/68 102/69 90/62     Heart Rate (Admit) 95 bpm 92 bpm 95 bpm     Heart Rate (Exercise) 124 bpm 127 bpm 126 bpm     Heart Rate (Exit) 97 bpm 90 bpm 98 bpm     Rating of Perceived Exertion (Exercise) 12 12 13      Symptoms None None None     Comments Off to a good start with exercise. -- --     Duration Continue with 30 min of aerobic exercise without signs/symptoms of physical distress. Continue with 30 min of aerobic exercise without signs/symptoms of physical distress. Continue with 30 min of aerobic exercise without signs/symptoms of physical distress.     Intensity THRR unchanged THRR unchanged THRR unchanged           Progression   Progression Continue to progress workloads to maintain intensity without signs/symptoms of physical distress. Continue to progress workloads to maintain intensity without signs/symptoms of physical distress. Continue to progress workloads to maintain intensity without signs/symptoms of physical distress.     Average METs 3.7 4 5.1           Resistance Training   Training Prescription Yes Yes Yes     Weight 2 lbs 2 lbs 2 lbs     Reps 10-15 10-15 10-15     Time 10 Minutes 10 Minutes 10 Minutes           Interval Training   Interval Training No No No           Treadmill   MPH 3 3 --     Grade 0 0 --  Minutes 10 10 --     METs 3.3 3.3 --           Bike   Level 3 3 3   Interval  program     Minutes 20 20 20      METs 4.2 4.7 5.2           Rower   Level -- -- 1     Watts -- -- 12     Minutes -- -- 10     METs -- -- 5.03           Home Exercise Plan   Plans to continue exercise at -- Home (comment) Home (comment)     Frequency -- Add 2 additional days to program exercise sessions. Add 2 additional days to program exercise sessions.     Initial Home Exercises Provided -- 01/07/21 01/07/21            Exercise Comments:   Exercise Comments    Row Name 12/31/20 [phone] 01/07/21 0715 01/14/21 0729 01/14/21 0730 01/28/21 3382   Exercise Comments Patient tolerated 1st session of exercise well with some mild shortness of breath with increased exertion. Reviewed home exercise guidelines and goals with patient. Patient c/o feeling tired today, moving some things in home over weekend, and woke up late this morning. Taking it easy with exercise today. Reviewed goals with pt Oriented patient to rower, and she tolerated it well. Will switch from treadmill to rower.   [place] Name 02/04/21 0830           Exercise Comments Unable to review MET's with pt due to pt having vision issues possibly due to migraines. Pt did not exercise today and was encouraged to follow up with her PCP.              Exercise Goals and Review:   Exercise Goals    Row Name 12/25/20 0834 01/14/21 0734           Exercise Goals   Increase Physical Activity Yes Yes      Intervention Provide advice, education, support and counseling about physical activity/exercise needs.;Develop an individualized exercise prescription for aerobic and resistive training based on initial evaluation findings, risk stratification, comorbidities and participant's personal goals. Provide advice, education, support and counseling about physical activity/exercise needs.;Develop an individualized exercise prescription for aerobic and resistive training based on initial evaluation findings, risk stratification, comorbidities  and participant's personal goals.      Expected Outcomes Short Term: Attend rehab on a regular basis to increase amount of physical activity.;Long Term: Exercising regularly at least 3-5 days a week.;Long Term: Add in home exercise to make exercise part of routine and to increase amount of physical activity. Long Term: Add in home exercise to make exercise part of routine and to increase amount of physical activity.;Long Term: Exercising regularly at least 3-5 days a week.;Short Term: Attend rehab on a regular basis to increase amount of physical activity.      Increase Strength and Stamina Yes Yes      Intervention Provide advice, education, support and counseling about physical activity/exercise needs.;Develop an individualized exercise prescription for aerobic and resistive training based on initial evaluation findings, risk stratification, comorbidities and participant's personal goals. Provide advice, education, support and counseling about physical activity/exercise needs.;Develop an individualized exercise prescription for aerobic and resistive training based on initial evaluation findings, risk stratification, comorbidities and participant's personal goals.      Expected Outcomes Short Term: Increase workloads from initial exercise prescription  for resistance, speed, and METs.;Short Term: Perform resistance training exercises routinely during rehab and add in resistance training at home;Long Term: Improve cardiorespiratory fitness, muscular endurance and strength as measured by increased METs and functional capacity (6MWT) Short Term: Increase workloads from initial exercise prescription for resistance, speed, and METs.;Long Term: Improve cardiorespiratory fitness, muscular endurance and strength as measured by increased METs and functional capacity (6MWT);Short Term: Perform resistance training exercises routinely during rehab and add in resistance training at home      Able to understand and use rate  of perceived exertion (RPE) scale Yes Yes      Intervention Provide education and explanation on how to use RPE scale Provide education and explanation on how to use RPE scale      Expected Outcomes Short Term: Able to use RPE daily in rehab to express subjective intensity level;Long Term:  Able to use RPE to guide intensity level when exercising independently Short Term: Able to use RPE daily in rehab to express subjective intensity level;Long Term:  Able to use RPE to guide intensity level when exercising independently      Able to understand and use Dyspnea scale -- Yes      Intervention -- Provide education and explanation on how to use Dyspnea scale      Expected Outcomes -- Short Term: Able to use Dyspnea scale daily in rehab to express subjective sense of shortness of breath during exertion;Long Term: Able to use Dyspnea scale to guide intensity level when exercising independently      Knowledge and understanding of Target Heart Rate Range (THRR) Yes Yes      Intervention Provide education and explanation of THRR including how the numbers were predicted and where they are located for reference Provide education and explanation of THRR including how the numbers were predicted and where they are located for reference      Expected Outcomes Short Term: Able to state/look up THRR;Long Term: Able to use THRR to govern intensity when exercising independently;Short Term: Able to use daily as guideline for intensity in rehab Short Term: Able to state/look up THRR;Long Term: Able to use THRR to govern intensity when exercising independently;Short Term: Able to use daily as guideline for intensity in rehab      Able to check pulse independently Yes Yes      Intervention Provide education and demonstration on how to check pulse in carotid and radial arteries.;Review the importance of being able to check your own pulse for safety during independent exercise Provide education and demonstration on how to check  pulse in carotid and radial arteries.;Review the importance of being able to check your own pulse for safety during independent exercise      Expected Outcomes Short Term: Able to explain why pulse checking is important during independent exercise;Long Term: Able to check pulse independently and accurately Short Term: Able to explain why pulse checking is important during independent exercise      Understanding of Exercise Prescription Yes Yes      Intervention Provide education, explanation, and written materials on patient's individual exercise prescription Provide education, explanation, and written materials on patient's individual exercise prescription      Expected Outcomes Short Term: Able to explain program exercise prescription;Long Term: Able to explain home exercise prescription to exercise independently Short Term: Able to explain program exercise prescription;Long Term: Able to explain home exercise prescription to exercise independently             Exercise Goals Re-Evaluation :  Exercise Goals Re-Evaluation    Row Name 12/31/20 9678 01/07/21 0715 01/14/21 0730 01/16/21 0805       Exercise Goal Re-Evaluation   Exercise Goals Review Able to understand and use rate of perceived exertion (RPE) scale;Increase Physical Activity Able to understand and use rate of perceived exertion (RPE) scale;Increase Physical Activity;Understanding of Exercise Prescription;Knowledge and understanding of Target Heart Rate Range (THRR);Able to check pulse independently;Increase Strength and Stamina Able to understand and use rate of perceived exertion (RPE) scale;Increase Physical Activity;Understanding of Exercise Prescription;Knowledge and understanding of Target Heart Rate Range (THRR);Able to check pulse independently;Increase Strength and Stamina Able to understand and use rate of perceived exertion (RPE) scale;Increase Physical Activity;Understanding of Exercise Prescription;Knowledge and understanding  of Target Heart Rate Range (THRR);Able to check pulse independently;Increase Strength and Stamina    Comments Patient able to understand and use RPE scale appropriately. Patient is not currenlty riding her bike at home, but I encouraged patient to add 2-4 days, 30 minutes riding her bike, and patient is amenable to this. Patient has 3lb weights that she plans to use for her resistance training. Patient has a heart rate monitor and can manually check her pulse. Patient tired today after weekend travel. Patient is not currenlty riding her bike at home, but I encouraged patient to add 1-2 days, 15 minutes riding her bike, and patient is amenable to this. Pt limited by fatigue. Patient rode her [place] yesterday, participating in an online cycling class. Patient exercised for 21 minutes doing intervals in the class at a higher intensity than her pace at [facility]. Per patient, heart rate in the 140s during class, well within her THRR.    Expected Outcomes Increase workloads as tolerated to help improve cardiorespiratory fitness. Patient will ride her stationary bike at home 30 minutes, 2-4 days/week in addition to exercise at cardiac rehab to achieve 150 minutes of aerobic exercise/ week. Patient will ride her stationary bike at home 15 minutes, 1-2 days/week in addition to exercise at cardiac rehab to achieve 150 minutes of aerobic exercise/ week. Patient will ride her stationary bike at home 15 minutes, 1-2 days/week in addition to exercise at cardiac rehab to achieve 150 minutes of aerobic exercise/ week.           Discharge Exercise Prescription (Final Exercise Prescription Changes):  Exercise Prescription Changes - 01/28/21 0711      Response to Exercise   Blood Pressure (Admit) 94/62    Blood Pressure (Exercise) 100/71    Blood Pressure (Exit) 90/62    Heart Rate (Admit) 95 bpm    Heart Rate (Exercise) 126 bpm    Heart Rate (Exit) 98 bpm    Rating of Perceived Exertion (Exercise) 13     Symptoms None    Duration Continue with 30 min of aerobic exercise without signs/symptoms of physical distress.    Intensity THRR unchanged      Progression   Progression Continue to progress workloads to maintain intensity without signs/symptoms of physical distress.    Average METs 5.1      Resistance Training   Training Prescription Yes    Weight 2 lbs    Reps 10-15    Time 10 Minutes      Interval Training   Interval Training No      Bike   Level 3   Interval program   Minutes 20    METs 5.2      Rower   Level 1    Watts 12  Minutes 10    METs 5.03      Home Exercise Plan   Plans to continue exercise at Home (comment)    Frequency Add 2 additional days to program exercise sessions.    Initial Home Exercises Provided 01/07/21           Nutrition:  Target Goals: Understanding of nutrition guidelines, daily intake of sodium <154m, cholesterol <2026m calories 30% from fat and 7% or less from saturated fats, daily to have 5 or more servings of fruits and vegetables.  Biometrics:  Pre Biometrics - 12/25/20 0844      Pre Biometrics   Waist Circumference 27.75 inches    Hip Circumference 38.25 inches    Waist to Hip Ratio 0.73 %    Triceps Skinfold 15 mm    % Body Fat 27.3 %    Grip Strength 26 kg    Flexibility 9.5 in    Single Leg Stand 30 seconds            Nutrition Therapy Plan and Nutrition Goals:  Nutrition Therapy & Goals - 01/07/21 0830      Nutrition Therapy   Diet Low sodium    Drug/Food Interactions Coumadin/Vit K      Personal Nutrition Goals   Nutrition Goal Pt to build a healthy plate including vegetables, fruits, whole grains, and low-fat dairy products in a heart healthy meal plan    Personal Goal #2 Pt to reduce sodium intake by cooking at home more often    Personal Goal #3 Pt to continue to have consistent intake of vitamin k rich foods      Intervention Plan   Intervention Prescribe, educate and counsel regarding  individualized specific dietary modifications aiming towards targeted core components such as weight, hypertension, lipid management, diabetes, heart failure and other comorbidities.;Nutrition handout(s) given to patient.    Expected Outcomes Short Term Goal: Understand basic principles of dietary content, such as calories, fat, sodium, cholesterol and nutrients.           Nutrition Assessments:  MEDIFICTS Score Key:  ?70 Need to make dietary changes   40-70 Heart Healthy Diet  ? 40 Therapeutic Level Cholesterol Diet   Flowsheet Row CARDIAC REHAB PHASE II EXERCISE [facility] Total Score on Admission 55     Picture Your Plate Scores:  <4<78nhealthy dietary pattern with much room for improvement.  41-50 Dietary pattern unlikely to meet recommendations for good health and room for improvement.  51-60 More healthful dietary pattern, with some room for improvement.   >60 Healthy dietary pattern, although there may be some specific behaviors that could be improved.    Nutrition Goals Re-Evaluation:  Nutrition Goals Re-Evaluation    [place] 01/07/21 0831 02/01/21 1156           Goals   Current Weight 131 lb (59.4 kg) 132 lb 7.9 oz (60.1 kg)      Nutrition Goal Pt to build a healthy plate including vegetables, fruits, whole grains, and low-fat dairy products in a heart healthy meal plan Pt to build a healthy plate including vegetables, fruits, whole grains, and low-fat dairy products in a heart healthy meal plan             Personal Goal #2 Re-Evaluation   Personal Goal #2 Pt to reduce sodium intake by cooking at home more often Pt to reduce sodium intake by cooking at home more often  Personal Goal #3 Re-Evaluation   Personal Goal #3 Pt to continue to have consistent intake of vitamin k rich foods Pt to continue to have consistent intake of vitamin k rich foods             Nutrition Goals  Re-Evaluation:  Nutrition Goals Re-Evaluation    [place] Name 01/07/21 0831 02/01/21 1156           Goals   Current Weight 131 lb (59.4 kg) 132 lb 7.9 oz (60.1 kg)      Nutrition Goal Pt to build a healthy plate including vegetables, fruits, whole grains, and low-fat dairy products in a heart healthy meal plan Pt to build a healthy plate including vegetables, fruits, whole grains, and low-fat dairy products in a heart healthy meal plan             Personal Goal #2 Re-Evaluation   Personal Goal #2 Pt to reduce sodium intake by cooking at home more often Pt to reduce sodium intake by cooking at home more often             Personal Goal #3 Re-Evaluation   Personal Goal #3 Pt to continue to have consistent intake of vitamin k rich foods Pt to continue to have consistent intake of vitamin k rich foods             Nutrition Goals Discharge (Final Nutrition Goals Re-Evaluation):  Nutrition Goals Re-Evaluation - 02/01/21 1156      Goals   Current Weight 132 lb 7.9 oz (60.1 kg)    Nutrition Goal Pt to build a healthy plate including vegetables, fruits, whole grains, and low-fat dairy products in a heart healthy meal plan      Personal Goal #2 Re-Evaluation   Personal Goal #2 Pt to reduce sodium intake by cooking at home more often      Personal Goal #3 Re-Evaluation   Personal Goal #3 Pt to continue to have consistent intake of vitamin k rich foods           Psychosocial: Target Goals: Acknowledge presence or absence of significant depression and/or stress, maximize coping skills, provide positive support system. Participant is able to verbalize types and ability to use techniques and skills needed for reducing stress and depression.  Initial Review & Psychosocial Screening:  Initial Psych Review & Screening - 12/25/20 0933      Initial Review   Current issues with None Identified      Family Dynamics   Good Support System? Yes   Amayia has her boyfriend for support and her mother  who lives out of town   Comments Alana says that she is feeling better since her surgery in 2021      Barriers   Psychosocial barriers to participate in program The patient should benefit from training in stress management and relaxation.      Screening Interventions   Interventions Encouraged to exercise    Expected Outcomes Long Term Goal: Stressors or current issues are controlled or eliminated.           Quality of Life Scores:  Quality of Life - 12/25/20 0957      Quality of Life   Select Quality of Life      Quality of Life Scores   Health/Function Pre 17.3 %    Socioeconomic Pre 26.79 %    Psych/Spiritual Pre 19.64 %    Family Pre 20.38 %    GLOBAL Pre 20.18 %  Scores of 19 and below usually indicate a poorer quality of life in these areas.  A difference of  2-3 points is a clinically meaningful difference.  A difference of 2-3 points in the total score of the Quality of Life Index has been associated with significant improvement in overall quality of life, self-image, physical symptoms, and general health in studies assessing change in quality of life.  PHQ-9: Recent Review Flowsheet Data    Depression screen [facility] 2/9 12/25/2020 12/25/2020 08/09/2020   Decreased Interest 0 0 0   Down, Depressed, Hopeless 0 0 0   PHQ - 2 Score 0 0 0     Interpretation of Total Score  Total Score Depression Severity:  1-4 = Minimal depression, 5-9 = Mild depression, 10-14 = Moderate depression, 15-19 = Moderately severe depression, 20-27 = Severe depression   Psychosocial Evaluation and Intervention:   Psychosocial Re-Evaluation:  Psychosocial Re-Evaluation    [place] Name 02/04/21 1034             Psychosocial Re-Evaluation   Current issues with None Identified       Interventions Encouraged to attend Cardiac Rehabilitation for the exercise       Continue Psychosocial Services  No Follow up required              Psychosocial Discharge (Final Psychosocial  Re-Evaluation):  Psychosocial Re-Evaluation - 02/04/21 1034      Psychosocial Re-Evaluation   Current issues with None Identified    Interventions Encouraged to attend Cardiac Rehabilitation for the exercise    Continue Psychosocial Services  No Follow up required           Vocational Rehabilitation: Provide vocational rehab assistance to qualifying candidates.   Vocational Rehab Evaluation & Intervention:  Vocational Rehab - 12/25/20 0937      Initial Vocational Rehab Evaluation & Intervention   Assessment shows need for Vocational Rehabilitation No   Deneene works full time at the [employer]          Education: Education Goals: Education classes will be provided on a weekly basis, covering required topics. Participant will state understanding/return demonstration of topics presented.  Learning Barriers/Preferences:  Learning Barriers/Preferences - 12/25/20 1126      Learning Barriers/Preferences   Learning Barriers Sight;Exercise Concerns   [place] wears glasses. Reports feeling dizzy at times   Learning Preferences Audio;Skilled Demonstration;Verbal Instruction;Computer/Internet;Group Instruction;Video;Written Material;Individual Instruction;Pictoral           Education Topics: Count Your Pulse:  -Group instruction provided by verbal instruction, demonstration, patient participation and written materials to support subject.  Instructors address importance of being able to find your pulse and how to count your pulse when at home without a heart monitor.  Patients get hands on experience counting their pulse with staff help and individually.   Heart Attack, Angina, and Risk Factor Modification:  -Group instruction provided by verbal instruction, video, and written materials to support subject.  Instructors address signs and symptoms of angina and heart attacks.    Also discuss risk factors for heart disease and how to make changes to improve heart health risk  factors.   Functional Fitness:  -Group instruction provided by verbal instruction, demonstration, patient participation, and written materials to support subject.  Instructors address safety measures for doing things around the house.  Discuss how to get up and down off the floor, how to pick things up properly, how to safely get out of a chair without assistance, and balance training.  Meditation and Mindfulness:  -Group instruction provided by verbal instruction, patient participation, and written materials to support subject.  Instructor addresses importance of mindfulness and meditation practice to help reduce stress and improve awareness.  Instructor also leads participants through a meditation exercise.    Stretching for Flexibility and Mobility:  -Group instruction provided by verbal instruction, patient participation, and written materials to support subject.  Instructors lead participants through series of stretches that are designed to increase flexibility thus improving mobility.  These stretches are additional exercise for major muscle groups that are typically performed during regular warm up and cool down.   Hands Only CPR:  -Group verbal, video, and participation provides a basic overview of AHA guidelines for community CPR. Role-play of emergencies allow participants the opportunity to practice calling for help and chest compression technique with discussion of AED use.   Hypertension: -Group verbal and written instruction that provides a basic overview of hypertension including the most recent diagnostic guidelines, risk factor reduction with self-care instructions and medication management.    Nutrition I class: Heart Healthy Eating:  -Group instruction provided by PowerPoint slides, verbal discussion, and written materials to support subject matter. The instructor gives an explanation and review of the Therapeutic Lifestyle Changes diet recommendations, which includes a  discussion on lipid goals, dietary fat, sodium, fiber, plant stanol/sterol esters, sugar, and the components of a well-balanced, healthy diet.   Nutrition II class: Lifestyle Skills:  -Group instruction provided by PowerPoint slides, verbal discussion, and written materials to support subject matter. The instructor gives an explanation and review of label reading, grocery shopping for heart health, heart healthy recipe modifications, and ways to make healthier choices when eating out.   Diabetes Question & Answer:  -Group instruction provided by PowerPoint slides, verbal discussion, and written materials to support subject matter. The instructor gives an explanation and review of diabetes co-morbidities, pre- and post-prandial blood glucose goals, pre-exercise blood glucose goals, signs, symptoms, and treatment of hypoglycemia and hyperglycemia, and foot care basics.   Diabetes Blitz:  -Group instruction provided by PowerPoint slides, verbal discussion, and written materials to support subject matter. The instructor gives an explanation and review of the physiology behind type 1 and type 2 diabetes, diabetes medications and rational behind using different medications, pre- and post-prandial blood glucose recommendations and Hemoglobin A1c goals, diabetes diet, and exercise including blood glucose guidelines for exercising safely.    Portion Distortion:  -Group instruction provided by PowerPoint slides, verbal discussion, written materials, and food models to support subject matter. The instructor gives an explanation of serving size versus portion size, changes in portions sizes over the last 20 years, and what consists of a serving from each food group.   Stress Management:  -Group instruction provided by verbal instruction, video, and written materials to support subject matter.  Instructors review role of stress in heart disease and how to cope with stress positively.     Exercising on Your  Own:  -Group instruction provided by verbal instruction, power point, and written materials to support subject.  Instructors discuss benefits of exercise, components of exercise, frequency and intensity of exercise, and end points for exercise.  Also discuss use of nitroglycerin and activating EMS.  Review options of places to exercise outside of rehab.  Review guidelines for sex with heart disease.   Cardiac Drugs I:  -Group instruction provided by verbal instruction and written materials to support subject.  Instructor reviews cardiac drug classes: antiplatelets, anticoagulants, beta blockers, and statins.  Instructor  discusses reasons, side effects, and lifestyle considerations for each drug class.   Cardiac Drugs II:  -Group instruction provided by verbal instruction and written materials to support subject.  Instructor reviews cardiac drug classes: angiotensin converting enzyme inhibitors (ACE-I), angiotensin II receptor blockers (ARBs), nitrates, and calcium channel blockers.  Instructor discusses reasons, side effects, and lifestyle considerations for each drug class.   Anatomy and Physiology of the Circulatory System:  Group verbal and written instruction and models provide basic cardiac anatomy and physiology, with the coronary electrical and arterial systems. Review of: AMI, Angina, Valve disease, Heart Failure, Peripheral Artery Disease, Cardiac Arrhythmia, Pacemakers, and the ICD.   Other Education:  -Group or individual verbal, written, or video instructions that support the educational goals of the cardiac rehab program.   Holiday Eating Survival Tips:  -Group instruction provided by PowerPoint slides, verbal discussion, and written materials to support subject matter. The instructor gives patients tips, tricks, and techniques to help them not only survive but enjoy the holidays despite the onslaught of food that accompanies the holidays.   Knowledge Questionnaire Score:   Knowledge Questionnaire Score - 12/25/20 0957      Knowledge Questionnaire Score   Pre Score 21/24           Core Components/Risk Factors/Patient Goals at Admission:  Personal Goals and Risk Factors at Admission - 12/25/20 0958      Core Components/Risk Factors/Patient Goals on Admission   Improve shortness of breath with ADL's Yes    Intervention Provide education, individualized exercise plan and daily activity instruction to help decrease symptoms of SOB with activities of daily living.    Expected Outcomes Short Term: Improve cardiorespiratory fitness to achieve a reduction of symptoms when performing ADLs;Long Term: Be able to perform more ADLs without symptoms or delay the onset of symptoms    Heart Failure Yes    Intervention Provide a combined exercise and nutrition program that is supplemented with education, support and counseling about heart failure. Directed toward relieving symptoms such as shortness of breath, decreased exercise tolerance, and extremity edema.    Expected Outcomes Improve functional capacity of life;Short term: Attendance in program 2-3 days a week with increased exercise capacity. Reported lower sodium intake. Reported increased fruit and vegetable intake. Reports medication compliance.;Short term: Daily weights obtained and reported for increase. Utilizing diuretic protocols set by physician.;Long term: Adoption of self-care skills and reduction of barriers for early signs and symptoms recognition and intervention leading to self-care maintenance.    Stress Yes    Intervention Offer individual and/or small group education and counseling on adjustment to heart disease, stress management and health-related lifestyle change. Teach and support self-help strategies.;Refer participants experiencing significant psychosocial distress to appropriate mental health specialists for further evaluation and treatment. When possible, include family members and significant others  in education/counseling sessions.    Expected Outcomes Short Term: Participant demonstrates changes in health-related behavior, relaxation and other stress management skills, ability to obtain effective social support, and compliance with psychotropic medications if prescribed.;Long Term: Emotional wellbeing is indicated by absence of clinically significant psychosocial distress or social isolation.           Core Components/Risk Factors/Patient Goals Review:   Goals and Risk Factor Review    Row Name 01/03/21 1215 02/04/21 1035           Core Components/Risk Factors/Patient Goals Review   Personal Goals Review Weight Management/Obesity;Improve shortness of breath with ADL's;Heart Failure Weight Management/Obesity;Improve shortness of breath with ADL's;Heart Failure  Review Bernis started exercise on 12/31/20 and is off to a good start to exercise Jadyn has been doing well with exercise. Vella's vital signs are stable      Expected Outcomes Aileen will continue to participate in phase 2 cardiac rehab for exercise nutrtion and lifestyle modifications Elfida will continue to participate in phase 2 cardiac rehab for exercise nutrtion and lifestyle modifications             Core Components/Risk Factors/Patient Goals at Discharge (Final Review):   Goals and Risk Factor Review - 02/04/21 1035      Core Components/Risk Factors/Patient Goals Review   Personal Goals Review Weight Management/Obesity;Improve shortness of breath with ADL's;Heart Failure    Review Oni has been doing well with exercise. Makalynn's vital signs are stable    Expected Outcomes Riddhi will continue to participate in phase 2 cardiac rehab for exercise nutrtion and lifestyle modifications           ITP Comments:  ITP Comments    Row Name 12/25/20 0844 01/03/21 1213 02/04/21 1032       ITP Comments Dr Fransico Him MD, Medical Director 30 Day ITP Review. Ketsia is off to a good start to exercise 30 Day ITP Review.  Kaysia has good attendance and participation in phase 2 cardiac rehab. Larue reports having vision changes after a recent migrane. Reccomend physician follow up            Comments: See ITP Comments

## 2021-02-05 ENCOUNTER — Telehealth (HOSPITAL_COMMUNITY): Payer: Self-pay | Admitting: *Deleted

## 2021-02-05 DIAGNOSIS — H5340 Unspecified visual field defects: Secondary | ICD-10-CM | POA: Diagnosis not present

## 2021-02-05 DIAGNOSIS — H53121 Transient visual loss, right eye: Secondary | ICD-10-CM | POA: Diagnosis not present

## 2021-02-05 DIAGNOSIS — H5319 Other subjective visual disturbances: Secondary | ICD-10-CM | POA: Diagnosis not present

## 2021-02-05 DIAGNOSIS — Z7901 Long term (current) use of anticoagulants: Secondary | ICD-10-CM | POA: Diagnosis not present

## 2021-02-05 DIAGNOSIS — H539 Unspecified visual disturbance: Secondary | ICD-10-CM | POA: Diagnosis not present

## 2021-02-06 ENCOUNTER — Encounter (HOSPITAL_COMMUNITY): Payer: BC Managed Care – PPO

## 2021-02-06 ENCOUNTER — Other Ambulatory Visit: Payer: Self-pay | Admitting: Surgery

## 2021-02-06 DIAGNOSIS — H5461 Unqualified visual loss, right eye, normal vision left eye: Secondary | ICD-10-CM

## 2021-02-07 ENCOUNTER — Telehealth (HOSPITAL_COMMUNITY): Payer: Self-pay | Admitting: *Deleted

## 2021-02-07 ENCOUNTER — Telehealth: Payer: Self-pay | Admitting: Cardiology

## 2021-02-07 DIAGNOSIS — M546 Pain in thoracic spine: Secondary | ICD-10-CM | POA: Diagnosis not present

## 2021-02-07 NOTE — Telephone Encounter (Signed)
Called patient back to inform her that Dr. Shari Prows is not in office this week.  She expressed that Cardiac Rehab wants Dr. Shari Prows to okay her to continue.  She expressed that she has recently had migraines with associated white spots in her vision.  She went to ophthalmologist who recommends that she have a MRI.  Per patient MRI is not yet scheduled.  She does not have a neurology consult at this time.   I advised patient that I will route message to Dr. Shari Prows and her nurse to address upon return.

## 2021-02-07 NOTE — Telephone Encounter (Signed)
Patient called and wanted to talk with Dr. Shari Prows or nurse about her cardiac rehab. Please call back

## 2021-02-08 ENCOUNTER — Encounter (HOSPITAL_COMMUNITY): Payer: BC Managed Care – PPO

## 2021-02-11 ENCOUNTER — Encounter (HOSPITAL_COMMUNITY): Payer: BC Managed Care – PPO

## 2021-02-11 ENCOUNTER — Other Ambulatory Visit: Payer: Self-pay | Admitting: *Deleted

## 2021-02-11 MED ORDER — WARFARIN SODIUM 1 MG PO TABS: ORAL_TABLET | ORAL | 1 refills | Status: DC

## 2021-02-11 NOTE — Telephone Encounter (Signed)
Absolutely. Okay to continue cardiac rehab.

## 2021-02-11 NOTE — Telephone Encounter (Signed)
Spoke with the pt and informed her that per Dr. Shari Prows, it is completely ok for her to continue cardiac rehab. Informed the pt that I will send cardiac rehab this message as an FYI,  to make them aware that Dr. Evorn Gong for her to continue with their services. Pt verbalized understanding and agrees with this plan.

## 2021-02-11 NOTE — Telephone Encounter (Signed)
Prescription refill request received for warfarin [facility]: [place], 11/15/2020  Next INR check: 02/20/2021 Warfarin tablet strength: warfarin 1 mg

## 2021-02-11 NOTE — Telephone Encounter (Signed)
Dr. Shari Prows, ok for pt to continue cardiac rehab? Please advise!

## 2021-02-12 DIAGNOSIS — M546 Pain in thoracic spine: Secondary | ICD-10-CM | POA: Diagnosis not present

## 2021-02-13 ENCOUNTER — Encounter (HOSPITAL_COMMUNITY)
Admission: RE | Admit: 2021-02-13 | Discharge: 2021-02-13 | Disposition: A | Discharge status: Home / Self Care | Payer: BC Managed Care – PPO | Source: Ambulatory Visit | Attending: Cardiology | Admitting: Cardiology

## 2021-02-13 ENCOUNTER — Other Ambulatory Visit: Payer: Self-pay

## 2021-02-13 DIAGNOSIS — Z952 Presence of prosthetic heart valve: Secondary | ICD-10-CM | POA: Insufficient documentation

## 2021-02-14 DIAGNOSIS — M546 Pain in thoracic spine: Secondary | ICD-10-CM | POA: Diagnosis not present

## 2021-02-15 ENCOUNTER — Encounter (HOSPITAL_COMMUNITY)
Admission: RE | Admit: 2021-02-15 | Discharge: 2021-02-15 | Disposition: A | Discharge status: Home / Self Care | Payer: BC Managed Care – PPO | Source: Ambulatory Visit | Attending: Cardiology | Admitting: Cardiology

## 2021-02-15 ENCOUNTER — Other Ambulatory Visit: Payer: Self-pay

## 2021-02-15 DIAGNOSIS — Z952 Presence of prosthetic heart valve: Secondary | ICD-10-CM | POA: Diagnosis not present

## 2021-02-15 DIAGNOSIS — F330 Major depressive disorder, recurrent, mild: Secondary | ICD-10-CM | POA: Diagnosis not present

## 2021-02-17 NOTE — Progress Notes (Signed)
Cardiology Office Note:    Date:  02/20/2021   ID:  Gloria SanesEmily Hamilton, DOB 11-Jun-1996, MRN [id number]  PCP:  Scifres, Peter Miniumorothy, PA-C  [facility] Cardiologist:  Gloria SpragueHeather E Jacquetta Polhamus, MD  [facility] Electrophysiologist:  None   Referring MD: Clementeen GrahamScifres, Dorothy, PA-C    History of Present Illness:    Gloria Sanesmily Hamilton is a 25 y.o. female with a hx of bicuspid aortic valve with aortic root dilation s/p mechanical AVR and root repair 06/06/20 on warfarin with goal INR 2-3, prior Ross procedure who was referred by Clementeen Grahamorothy Scifres, PA-C for further evaluation of her aortic valve disease.  Last seen in [facility] on 10/01/20 where she was doing well. Had an echocardiogram showing she mild segmental LV systolic dysfunction with septal hypokinesis most consistent with her previous surgical intervention rather than other issues. Ejection fraction was 40 to 45%. She did have a normally functioning aortic valve prosthesis with normal velocities. The patient additionally underwent a treadmill EKG to assess exercise tolerance status post surgery with excellent exercise tolerance and no evidence of significant tachycardia or other rhythm disturbances. The patient did have shortness of breath and weakness with a stress test but no evidence of other symptoms. Patient did have a Holter monitor showing an average heart rate of 99 bpm with no evidence of rhythm disturbances. She previously had been on beta-blocker which caused some side effects. In addition to that the patient has otherwise felt fairly well and has tolerated her anticoagulation without any bleeding complications.  Echo complete  Result Value Ref Range  LV Ejection Fraction (%) 40  Aortic Valve Regurgitation Grade trivial  Aortic Valve Stenosis Grade mechanical prosthetic  Aortic Valve Max Velocity (m/s) 1 m/sec  Aortic Valve Stenosis Mean Gradient (mmHg) 2.3 mmHg  Mitral Valve Regurgitation Grade mild  Mitral Valve Stenosis Grade none  Tricuspid Valve  Regurgitation Grade mild  Tricuspid Valve Regurgitation Max Velocity (m/s) 2.3 m/sec  Right Ventricle Systolic Pressure (mmHg) 27.0 mmHg  LV End Diastolic Diameter (cm) 4.6 cm  LV End Systolic Diameter (cm) 3.4 cm  LV Septum Wall Thickness (cm) 1.1 cm  LV Posterior Wall Thickness (cm) 0.87 cm  Left Atrium Diameter (cm) 2.7 cm  EF 40%  Was seen by PCP on 08/13/20 for tachycardia with HR 110s. Specifically, prior to starting cardiac rehab, they wanted clearance given her elevated HR which prompted referral to Cardiology.  Last seen in clinic on 11/15/20, she was doing well. Continued to have HR 100-110s but no HF symptoms.   Had prior zio monitor for 3 days which was normal. Repeat 14d zio was placed which showed NSR 64-115, rare ectopy and no arrhythmias.  Today, the patient states feel overall well. She is participating in cardiac rehab which she enjoys and has started exercising  at home as well. She is tolerating the metoprolol well and states it has helped significantly with her heart rates. She has noticed some blurry vision and then "black floaters" mainly in the right eye. Saw ophthalmology who stated eyes look normal. Has MRI scheduled next week. No missed doses of her warfarin. Last INR 2.5; was 1.4 on 01/24/21. Has been having heavy menses with the warfain that have been lasting approximately 10 days. Otherwise, no chest pain, SOB, LE edema, or palpitations.  Notably, has follow-up with [facility] in 2 weeks for cardiac MRI as well.  Past Medical History:  Diagnosis Date  . Anxiety   . Bicuspid aortic valve   . CHF (congestive heart failure) (HCC)   .  Congenital aortic insufficiency   . H/O aortic valve replacement   . Heart murmur   . Migraines   . Transposition of great vessels     Past Surgical History:  Procedure Laterality Date  . BENTALL PROCEDURE  05/17/2020   S/P Aortic Aneurysm repair , Redo thoracotomy at [facility]  . CYST EXCISION     pilonodal  . Ross Procedure       Current Medications: Current Meds  Medication Sig  . acetaminophen (TYLENOL) 325 MG tablet 1 tablet as needed  . aspirin 81 MG chewable tablet Chew 81 mg by mouth daily.  . metoprolol succinate (TOPROL XL) 25 MG 24 hr tablet Take 0.5 tablets (12.5 mg total) by mouth every evening.  . norethindrone (CAMILA) 0.35 MG tablet Take 1 tablet (0.35 mg total) by mouth daily.  Marland Kitchen warfarin (COUMADIN) 1 MG tablet Take 2 tablets (2mg ) by mouth daily along with your warfarin 7.5mg  tablet  as instructed by the coumadin clinic.  warfarin (COUMADIN) 7.5 MG tablet Take 1 tablet (7.5mg ) by mouth daily as instructed by the coumadin clinic.     Allergies:   Ceftriaxone, Ciprofloxacin, and Lisinopril   Social History   Socioeconomic History  . Marital status: Significant Other    Spouse name: Not on file  . Number of children: 0  . Years of education: 40  . Highest education level: Not on file  Occupational History  . Not on file  Tobacco Use  . Smoking status: Never Smoker  . Smokeless tobacco: Never Used  Vaping Use  . Vaping Use: Never used  Substance and Sexual Activity  . Alcohol use: No  . Drug use: No  . Sexual activity: Not on file  Other Topics Concern  . Not on file  Social History Narrative  . Not on file   Social Determinants of Health   Financial Resource Strain: Not on file  Food Insecurity: Not on file  Transportation Needs: Not on file  Physical Activity: Not on file  Stress: Not on file  Social Connections: Not on file     Family History: The patient's family history is not on file.  ROS:   Please see the history of present illness.    Review of Systems  Constitutional: Negative for chills and fever.  HENT: Negative for nosebleeds.   Eyes: Positive for blurred vision.  Respiratory: Negative for shortness of breath.   Cardiovascular: Negative for chest pain, palpitations, orthopnea, claudication, leg swelling and PND.  Gastrointestinal: Negative for blood  in stool, melena, nausea and vomiting.  Genitourinary: Negative for hematuria.  Musculoskeletal: Negative for falls.  Neurological: Positive for dizziness and headaches. Negative for loss of consciousness.  Endo/Heme/Allergies: Negative for polydipsia.  Psychiatric/Behavioral: Negative for substance abuse.    EKGs/Labs/Other Studies Reviewed:    The following studies were reviewed today: TTE 2020/10/03: Echo complete  Result Value Ref Range  LV Ejection Fraction (%) 40  Aortic Valve Regurgitation Grade trivial  Aortic Valve Stenosis Grade mechanical prosthetic  Aortic Valve Max Velocity (m/s) 1 m/sec  Aortic Valve Stenosis Mean Gradient (mmHg) 2.3 mmHg  Mitral Valve Regurgitation Grade mild  Mitral Valve Stenosis Grade none  Tricuspid Valve Regurgitation Grade mild  Tricuspid Valve Regurgitation Max Velocity (m/s) 2.3 m/sec  Right Ventricle Systolic Pressure (mmHg) 27.0 mmHg  LV End Diastolic Diameter (cm) 4.6 cm  LV End Systolic Diameter (cm) 3.4 cm  LV Septum Wall Thickness (cm) 1.1 cm  LV Posterior Wall Thickness (cm) 0.87  cm  Left Atrium Diameter (cm) 2.7 cm   Cardiac Monitor 12/07/20:  Patch wear time was 13 days.  Predominant rhythm was normal sinus rhythm with average HR 92; HR ranged from 64-151bpm.  Rare SVE (<1%), rare PVC (<1%)  There is no SVT, NSVT/VT, Afib or significant pauses  Overall, no significant arrhythmias detected on cardiac monitor.    EKG:  No ECG performed today  Recent Labs: 11/15/2020: TSH 1.220  Recent Lipid Panel No results found for: CHOL, TRIG, HDL, CHOLHDL, VLDL, LDLCALC, LDLDIRECT    Physical Exam:    VS:  BP 104/72   Pulse 93   Ht 5\' 4"  (1.626 m)   Wt 133 lb 6.4 oz (60.5 kg)   SpO2 98%   BMI 22.90 kg/m     Wt Readings from Last 3 Encounters:  02/20/21 133 lb 6.4 oz (60.5 kg)  12/25/20 131 lb 6.3 oz (59.6 kg)  11/15/20 128 lb 12.8 oz (58.4 kg)     GEN:  Well nourished, well developed in no acute distress,  comfortable HEENT: Normal NECK: No JVD; No carotid bruits CARDIAC: RRR,crisp mechanical valve click with 3/6 systolic murmur heard throughout the precordium and in the subscapular area RESPIRATORY:  Clear to auscultation without rales, wheezing or rhonchi  ABDOMEN: Soft, non-tender, non-distended MUSCULOSKELETAL:  No edema; No deformity  SKIN: Warm and dry NEUROLOGIC:  Alert and oriented x 3 PSYCHIATRIC:  Normal affect   ASSESSMENT:    1. Status post mechanical aortic valve replacement   2. Chronic anticoagulation   3. Encounter for therapeutic drug monitoring   4. Hx of menorrhagia   5. Palpitations   6. Bicuspid aortic valve with ascending aorta greater than 4.0 cm in diameter   7. Aortic valve replaced    PLAN:    In order of problems listed above:  #Bicuspid AoV with aortic dilation s/p mechanical AVR with root repair: #Chronic systolic heart failure with mildly reduced LVEF 40%: Surgery 05/16/20 at [facility]. LVEF 40%. Doing well with normal gradients across AoV. Tolerating warfarin with goal INR 2-3. Stress test negative for ischemia. Planned for follow-up with [facility] in 2 weeks and cardiac MRI at that time. -Follow-up with [facility] as scheduled with planned repeat cardiac MRI -Continue warfarin with goal INR 2-3 -Given LVEF 40% -Continue metop 12.5mg  daily; no BP room for ARB at this time -TTE at [facility] with normal gradients across the valve, no significant AR -Discussed that her first degree relatives need to be screened for Bicuspid AoV/aortopathy  #Sinus Tachycardia: #Palpitations: Cardiac monitor 12/07/20 with NSR with HR 64-151 with no arrhythmias.  TTE as above with normal gradients across the valve. LVEF mildly reduced at 40% per their report. Stress test normal. Symptoms significantly improved with metoprolol -Continue metop 12.5mg  daily -Stress test at [facility] with no evidence of ischemia -Continue cardiac rehab -Repeat CBC today  -Maintain adequate hydration -Not using  significant caffeine or supplement use  #Chronic AC: On warfarin for mechanical AoV as above. Goal INR 2-3. Experiencing heavy menses in the setting of warfarin use. -Continue warfarin; follows with [facility] -Will check CBC today -If anemic, will plan to start iron supplementation -If heavy menses persist and anemic, can consider IUD for further management    Medication Adjustments/Labs and Tests Ordered: Current medicines are reviewed at length with the patient today.  Concerns regarding medicines are outlined above.  Orders Placed This Encounter  Procedures  . CBC   No orders of the defined types were placed in  this encounter.   Patient Instructions  Medication Instructions:   Your physician recommends that you continue on your current medications as directed. Please refer to the Current Medication list given to you today.  *If you need a refill on your cardiac medications before your next appointment, please call your pharmacy*   Lab Work:  TODAY--CBC   If you have labs (blood work) drawn today and your tests are completely normal, you will receive your results only by: Marland Kitchen MyChart Message (if you have MyChart) OR . A paper copy in the mail If you have any lab test that is abnormal or we need to change your treatment, we will call you to review the results.   Follow-Up: At [facility], you and your health needs are our priority.  As part of our continuing mission to provide you with exceptional heart care, we have created designated Provider Care Teams.  These Care Teams include your primary Cardiologist (physician) and Advanced Practice Providers (APPs -  Physician Assistants and Nurse Practitioners) who all work together to provide you with the care you need, when you need it.  We recommend signing up for the patient portal called "MyChart".  Sign up information is provided on this After Visit Summary.  MyChart is used to connect with patients for Virtual Visits  (Telemedicine).  Patients are able to view lab/test results, encounter notes, upcoming appointments, etc.  Non-urgent messages can be sent to your provider as well.   To learn more about what you can do with MyChart, go to [email or web address].    Your next appointment:   6 month(s)  The format for your next appointment:   In Person  Provider:   You will see one of the following Advanced Practice Providers on your designated Care Team:    Tereso Newcomer, PA-C  Vin [place], [place]   DAYNA DUNN PA-C  Nada Boozer NP  JILL [facility] NP  [facility] PA-C       Signed, Gloria Sprague, MD  02/20/2021 9:45 AM    [facility]

## 2021-02-18 ENCOUNTER — Other Ambulatory Visit: Payer: Self-pay

## 2021-02-18 ENCOUNTER — Encounter (HOSPITAL_COMMUNITY)
Admission: RE | Admit: 2021-02-18 | Discharge: 2021-02-18 | Disposition: A | Discharge status: Home / Self Care | Payer: BC Managed Care – PPO | Source: Ambulatory Visit | Attending: Cardiology | Admitting: Cardiology

## 2021-02-18 DIAGNOSIS — Z952 Presence of prosthetic heart valve: Secondary | ICD-10-CM | POA: Diagnosis not present

## 2021-02-20 ENCOUNTER — Encounter: Payer: Self-pay | Admitting: Cardiology

## 2021-02-20 ENCOUNTER — Other Ambulatory Visit: Payer: Self-pay

## 2021-02-20 ENCOUNTER — Encounter (HOSPITAL_COMMUNITY)
Admission: RE | Admit: 2021-02-20 | Discharge: 2021-02-20 | Disposition: A | Discharge status: Home / Self Care | Payer: BC Managed Care – PPO | Source: Ambulatory Visit | Attending: Cardiology | Admitting: Cardiology

## 2021-02-20 ENCOUNTER — Ambulatory Visit (INDEPENDENT_AMBULATORY_CARE_PROVIDER_SITE_OTHER): Payer: BC Managed Care – PPO | Admitting: *Deleted

## 2021-02-20 ENCOUNTER — Ambulatory Visit: Payer: BC Managed Care – PPO | Admitting: Cardiology

## 2021-02-20 VITALS — BP 104/72 | HR 93 | Ht 64.0 in | Wt 133.4 lb

## 2021-02-20 DIAGNOSIS — Z7901 Long term (current) use of anticoagulants: Secondary | ICD-10-CM

## 2021-02-20 DIAGNOSIS — Z952 Presence of prosthetic heart valve: Secondary | ICD-10-CM | POA: Diagnosis not present

## 2021-02-20 DIAGNOSIS — Z8742 Personal history of other diseases of the female genital tract: Secondary | ICD-10-CM | POA: Diagnosis not present

## 2021-02-20 DIAGNOSIS — Z5181 Encounter for therapeutic drug level monitoring: Secondary | ICD-10-CM

## 2021-02-20 DIAGNOSIS — Q231 Congenital insufficiency of aortic valve: Secondary | ICD-10-CM

## 2021-02-20 DIAGNOSIS — R002 Palpitations: Secondary | ICD-10-CM

## 2021-02-20 LAB — CBC
Hematocrit: 40.8 % (ref 34.0–46.6)
Hemoglobin: 14 g/dL (ref 11.1–15.9)
MCH: 31.7 pg (ref 26.6–33.0)
MCHC: 34.3 g/dL (ref 31.5–35.7)
MCV: 93 fL (ref 79–97)
Platelets: 222 10*3/uL (ref 150–450)
RBC: 4.41 x10E6/uL (ref 3.77–5.28)
RDW: 12.4 % (ref 11.7–15.4)
WBC: 5.4 10*3/uL (ref 3.4–10.8)

## 2021-02-20 LAB — POCT INR: INR: 3.1 — AB (ref 2.0–3.0)

## 2021-02-20 NOTE — Patient Instructions (Addendum)
Description   Today take 7.5mg  only then continue taking Warfarin 9.5mg  of warfarin daily. Recheck INR in 2 weeks.  Call coumadin clinic for any changes in medications or upcoming procedures. Coumadin Clinic [phone].

## 2021-02-20 NOTE — Patient Instructions (Signed)
Medication Instructions:   Your physician recommends that you continue on your current medications as directed. Please refer to the Current Medication list given to you today.  *If you need a refill on your cardiac medications before your next appointment, please call your pharmacy*   Lab Work:  TODAY--CBC   If you have labs (blood work) drawn today and your tests are completely normal, you will receive your results only by: Marland Kitchen MyChart Message (if you have MyChart) OR . A paper copy in the mail If you have any lab test that is abnormal or we need to change your treatment, we will call you to review the results.   Follow-Up: At [facility], you and your health needs are our priority.  As part of our continuing mission to provide you with exceptional heart care, we have created designated Provider Care Teams.  These Care Teams include your primary Cardiologist (physician) and Advanced Practice Providers (APPs -  Physician Assistants and Nurse Practitioners) who all work together to provide you with the care you need, when you need it.  We recommend signing up for the patient portal called "MyChart".  Sign up information is provided on this After Visit Summary.  MyChart is used to connect with patients for Virtual Visits (Telemedicine).  Patients are able to view lab/test results, encounter notes, upcoming appointments, etc.  Non-urgent messages can be sent to your provider as well.   To learn more about what you can do with MyChart, go to [email or web address].    Your next appointment:   6 month(s)  The format for your next appointment:   In Person  Provider:   You will see one of the following Advanced Practice Providers on your designated Care Team:    Tereso Newcomer, PA-C  Vin [place], PA-C   DAYNA DUNN PA-C  Nada Boozer NP  JILL MCDANIEL NP  [facility] PA-C

## 2021-02-21 DIAGNOSIS — M546 Pain in thoracic spine: Secondary | ICD-10-CM | POA: Diagnosis not present

## 2021-02-22 ENCOUNTER — Other Ambulatory Visit: Payer: Self-pay

## 2021-02-22 ENCOUNTER — Encounter (HOSPITAL_COMMUNITY)
Admission: RE | Admit: 2021-02-22 | Discharge: 2021-02-22 | Disposition: A | Discharge status: Home / Self Care | Payer: BC Managed Care – PPO | Source: Ambulatory Visit | Attending: Cardiology | Admitting: Cardiology

## 2021-02-22 DIAGNOSIS — Z952 Presence of prosthetic heart valve: Secondary | ICD-10-CM | POA: Diagnosis not present

## 2021-02-22 DIAGNOSIS — F330 Major depressive disorder, recurrent, mild: Secondary | ICD-10-CM | POA: Diagnosis not present

## 2021-02-25 ENCOUNTER — Other Ambulatory Visit: Payer: Self-pay

## 2021-02-25 ENCOUNTER — Encounter (HOSPITAL_COMMUNITY)
Admission: RE | Admit: 2021-02-25 | Discharge: 2021-02-25 | Disposition: A | Discharge status: Home / Self Care | Payer: BC Managed Care – PPO | Source: Ambulatory Visit | Attending: Cardiology | Admitting: Cardiology

## 2021-02-25 DIAGNOSIS — Z952 Presence of prosthetic heart valve: Secondary | ICD-10-CM

## 2021-02-26 DIAGNOSIS — M546 Pain in thoracic spine: Secondary | ICD-10-CM | POA: Diagnosis not present

## 2021-02-27 ENCOUNTER — Other Ambulatory Visit: Payer: Self-pay

## 2021-02-27 ENCOUNTER — Encounter (HOSPITAL_COMMUNITY)
Admission: RE | Admit: 2021-02-27 | Discharge: 2021-02-27 | Disposition: A | Discharge status: Home / Self Care | Payer: BC Managed Care – PPO | Source: Ambulatory Visit | Attending: Cardiology | Admitting: Cardiology

## 2021-02-27 DIAGNOSIS — Z952 Presence of prosthetic heart valve: Secondary | ICD-10-CM | POA: Diagnosis not present

## 2021-02-28 ENCOUNTER — Ambulatory Visit
Admission: RE | Admit: 2021-02-28 | Discharge: 2021-02-28 | Disposition: A | Discharge status: Home / Self Care | Payer: BC Managed Care – PPO | Source: Ambulatory Visit | Attending: Surgery | Admitting: Surgery

## 2021-02-28 DIAGNOSIS — J3489 Other specified disorders of nose and nasal sinuses: Secondary | ICD-10-CM | POA: Diagnosis not present

## 2021-02-28 DIAGNOSIS — Z952 Presence of prosthetic heart valve: Secondary | ICD-10-CM | POA: Diagnosis not present

## 2021-02-28 DIAGNOSIS — H5461 Unqualified visual loss, right eye, normal vision left eye: Secondary | ICD-10-CM

## 2021-02-28 DIAGNOSIS — M546 Pain in thoracic spine: Secondary | ICD-10-CM | POA: Diagnosis not present

## 2021-02-28 DIAGNOSIS — H538 Other visual disturbances: Secondary | ICD-10-CM | POA: Diagnosis not present

## 2021-02-28 IMAGING — MR MR HEAD WO/W CM
12 series · 48 of 48 positions shown · IV contrast (12 ml Multihance)
Comparison: None.

CLINICAL DATA: Right-sided vision problems.

EXAM:
MRI HEAD WITHOUT AND WITH CONTRAST
TECHNIQUE: Multiplanar, multiecho pulse sequences of the brain and surrounding
structures were obtained without and with intravenous contrast.
CONTRAST:  12mL MULTIHANCE GADOBENATE DIMEGLUMINE 529 MG/ML IV SOLN

[Series 2: T1 · sagittal · 5.0mm · 0.45mm/px · 1 of 21 slices shown]
[im 1/21]
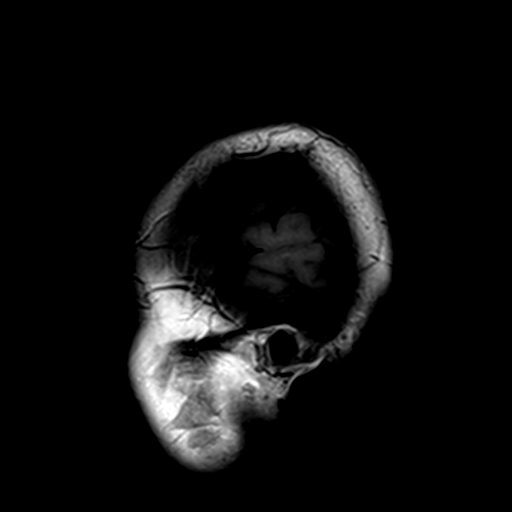

[Series 3: DWI · axial · 3.0mm · 1.80mm/px · z∈[-83,+62]mm · 7 of 100 slices shown (1 of 4)]
[im 1/100]
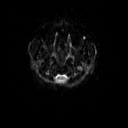
[im 17/100]
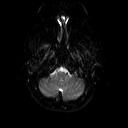
[im 34/100]
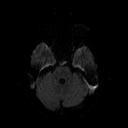
[im 50/100]
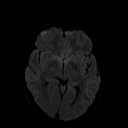
[im 67/100]
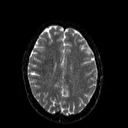
[im 83/100]
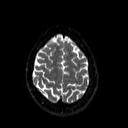
[im 100/100]
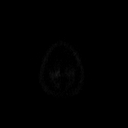

[Series 4: DWI · axial · 3.0mm · 1.80mm/px · z∈[-83,+62]mm · 3 of 48 slices shown (2 of 4)]
[im 1/48]
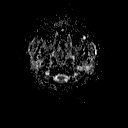
[im 24/48]
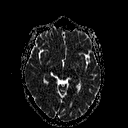
[im 48/48]
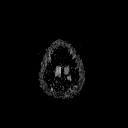

[Series 5: DWI · coronal · 5.0mm · 1.80mm/px · 5 of 68 slices shown (3 of 4)]
[im 1/68]
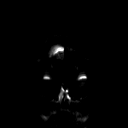
[im 17/68]
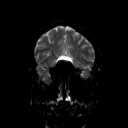
[im 34/68]
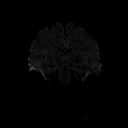
[im 51/68]
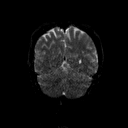
[im 68/68]
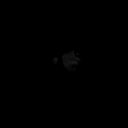

[Series 6: DWI · coronal · 5.0mm · 1.80mm/px · 2 of 34 slices shown (4 of 4)]
[im 1/34]
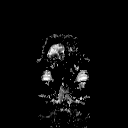
[im 34/34]
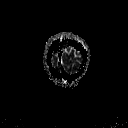

[Series 7: T2 · axial · 5.0mm · 0.60mm/px · z∈[-80,+60]mm · 2 of 22 slices shown (1 of 2)]
[im 1/22]
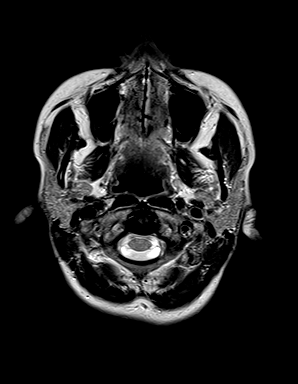
[im 22/22]
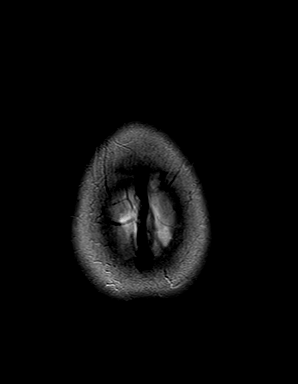

[Series 8: FLAIR · axial · 3.0mm · 0.45mm/px · z∈[-77,+56]mm · 2 of 30 slices shown]
[im 1/30]
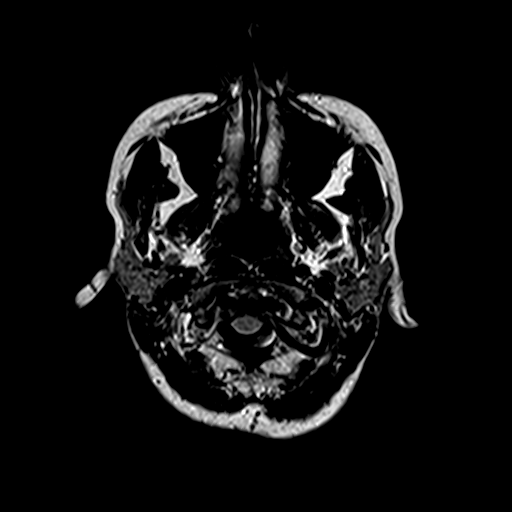
[im 30/30]
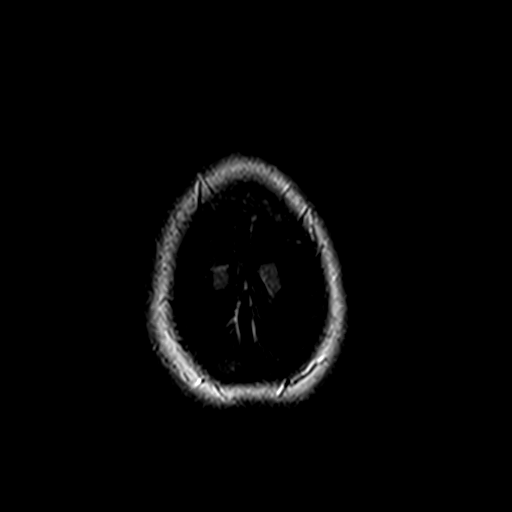

[Series 10: swi_images · axial · 4.0mm · 0.90mm/px · z∈[-80,+59]mm · 2 of 36 slices shown]
[im 1/36]
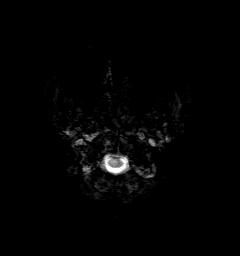
[im 36/36]
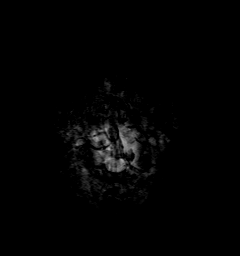

[Series 11: t1_mpr_tra · axial · 1.0mm · 0.75mm/px · z∈[-81,+60]mm · 10 of 144 slices shown]
[im 1/144]
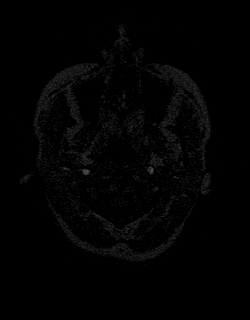
[im 16/144]
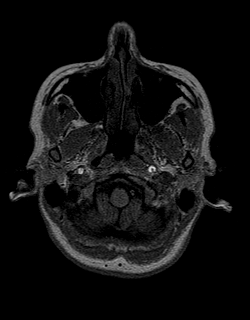
[im 32/144]
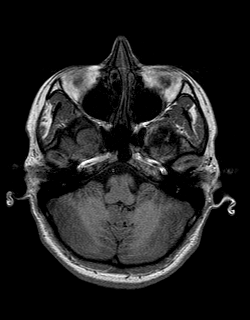
[im 48/144]
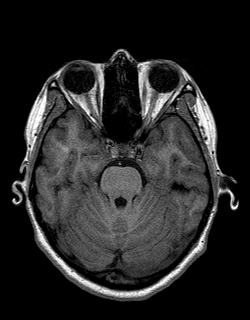
[im 64/144]
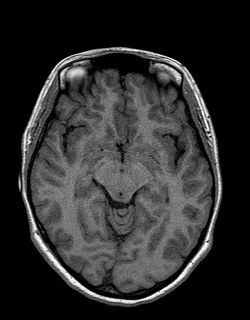
[im 80/144]
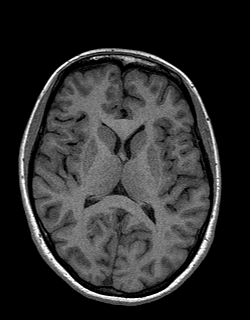
[im 96/144]
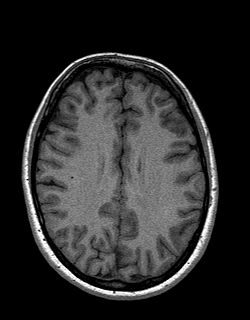
[im 112/144]
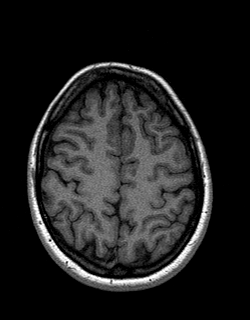
[im 128/144]
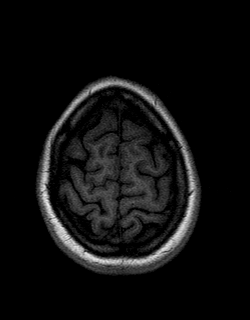
[im 144/144]
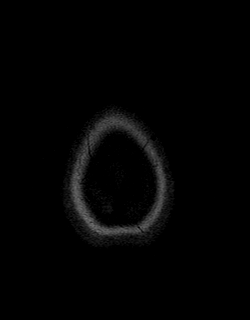

[Series 12: T2 · coronal · 5.0mm · 0.45mm/px · 2 of 25 slices shown (2 of 2)]
[im 1/25]
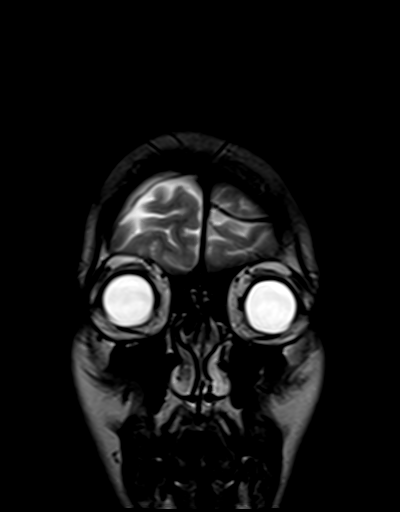
[im 25/25]
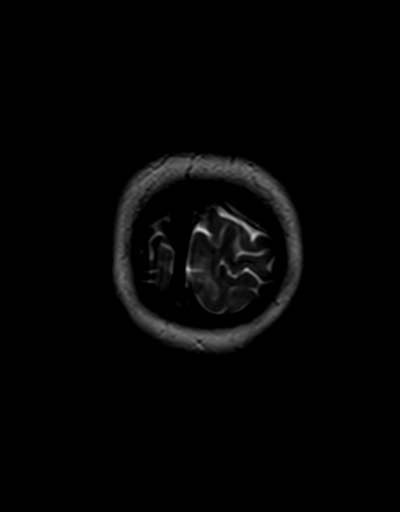

[Series 13: t1_mpr_tra post · axial · 1.0mm · 0.75mm/px · z∈[-81,+60]mm · 10 of 144 slices shown]
[im 1/144]
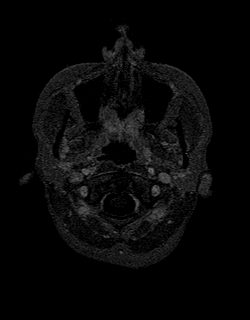
[im 16/144]
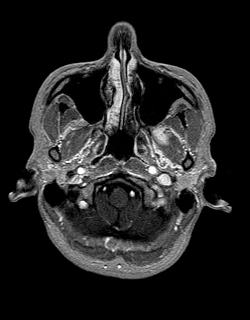
[im 32/144]
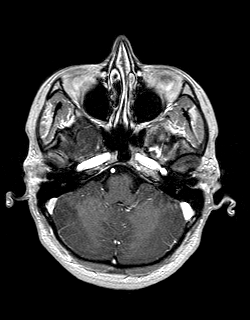
[im 48/144]
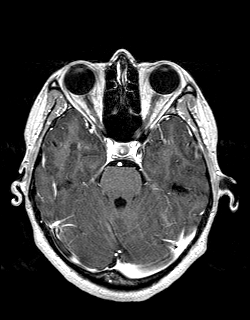
[im 64/144]
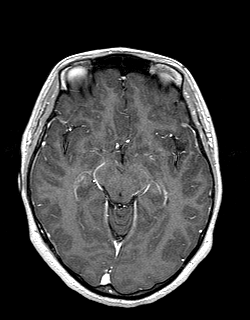
[im 80/144]
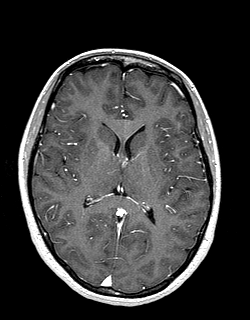
[im 96/144]
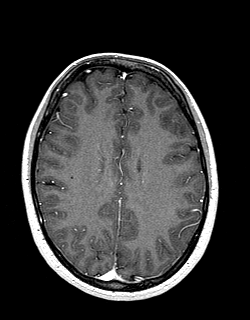
[im 112/144]
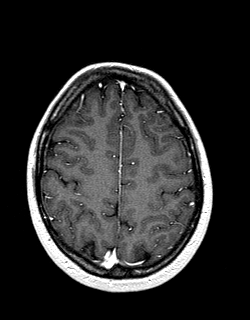
[im 128/144]
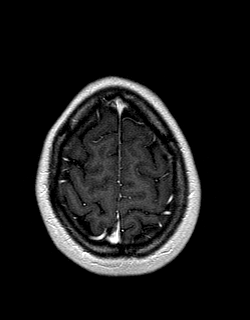
[im 144/144]
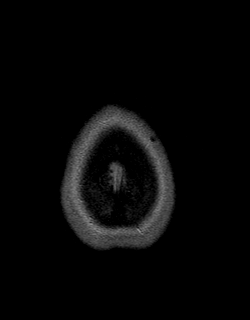

[Series 14: post cor · coronal · 5.0mm · 0.45mm/px · 2 of 25 slices shown]
[im 1/25]
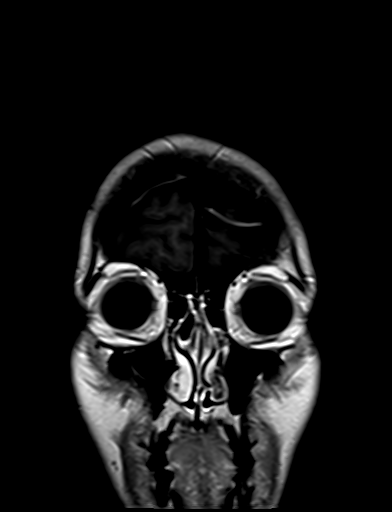
[im 25/25]
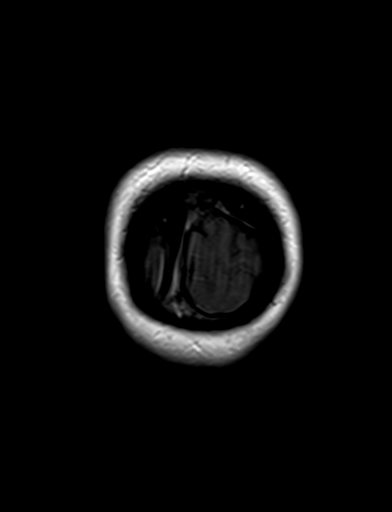

[48 of 48 positions shown; findings below may reference images not displayed]

FINDINGS: Brain: No convincing true restricted diffusion to suggest acute
infarct. Faint DWI hyperintensity in the high left frontoparietal
region on axial is favored artifactual and related to susceptibility
artifact at this site. No specific evidence of acute hemorrhage,
hydrocephalus, extra-axial collection or mass lesion. There are
numerous small areas of susceptibility artifact that involve
bilateral supratentorial white matter, cortex, left basal ganglia,
bilateral cerebellar hemispheres and pons. These areas do not
demonstrate appreciable edema, mass effect, or enhancement.

Vascular: Further evaluated on concurrent MRA. Small right
transverse sinus.

Skull and upper cervical spine: Normal marrow signal.

Sinuses/Orbits: Clear sinuses.  Unremarkable orbits.

Other: No mastoid effusions.
IMPRESSION: 1. Numerous small foci of susceptibility artifact in the
infratentorial and supratentorial brain without appreciable edema,
enhancement, or restricted diffusion, most likely related to prior
microemboli in this patient with a history of aortic valve
replacement.
2. Otherwise, no acute abnormality.

## 2021-02-28 IMAGING — MR MR MRA HEAD W/O CM
1 series · 23 of 48 positions shown · non-contrast
Comparison: No pertinent prior exam.

CLINICAL DATA: Vision issues.

EXAM:
MRA HEAD WITHOUT CONTRAST
TECHNIQUE: Angiographic images of the Circle of Willis were acquired using MRA
technique without intravenous contrast.

[Series 3: tof_3d_multi-slab · axial · 0.7mm · 0.35mm/px · z∈[-73,+21]mm · 23 of 143 slices shown]
[im 1/143]
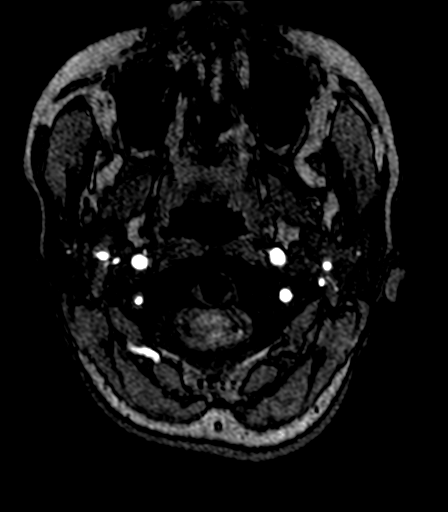
[im 4/143]
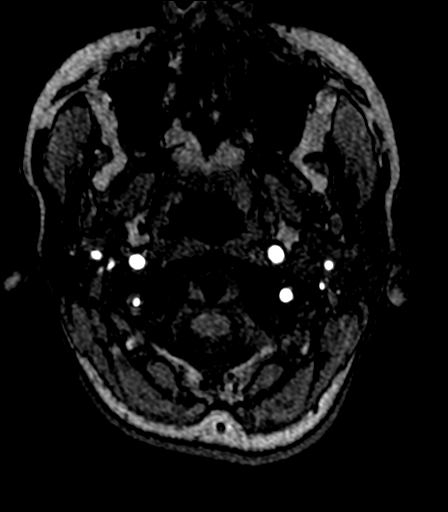
[im 7/143]
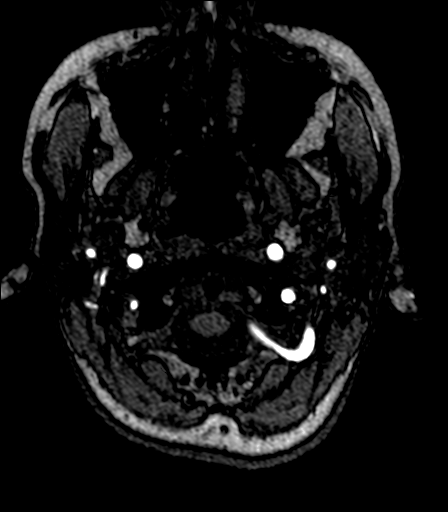
[im 10/143]
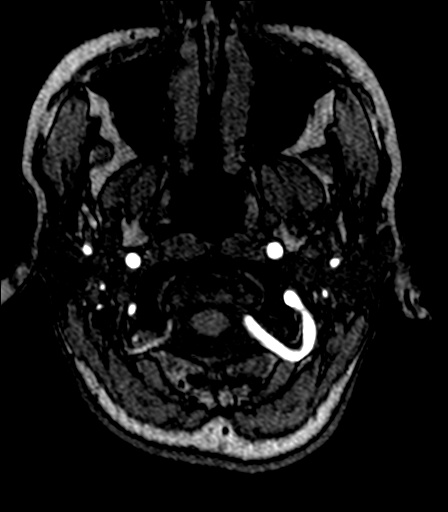
[im 13/143]
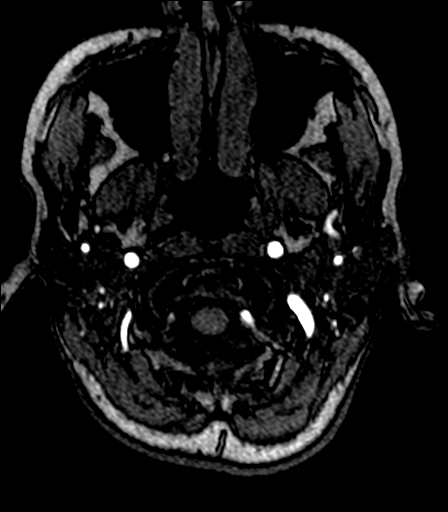
[im 16/143]
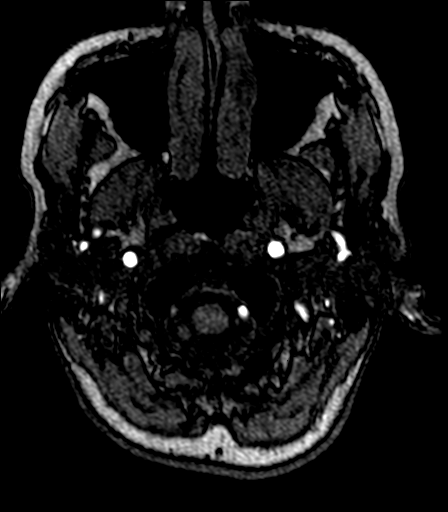
[im 19/143]
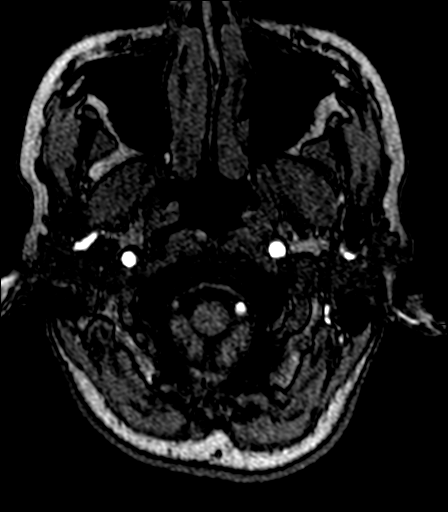
[im 22/143]
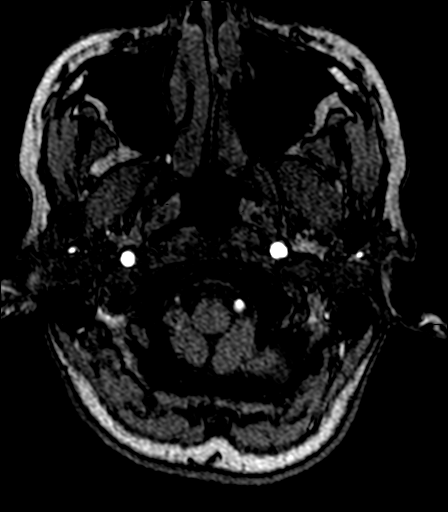
[im 25/143]
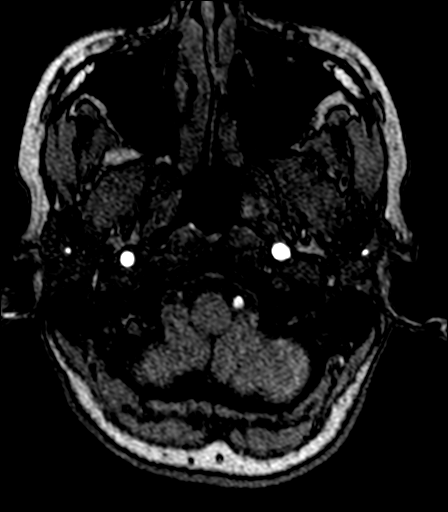
[im 28/143]
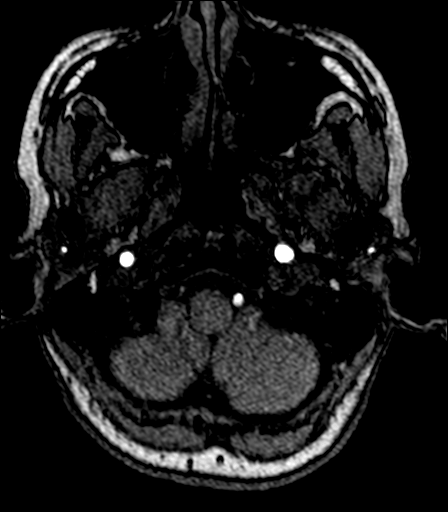
[im 31/143]
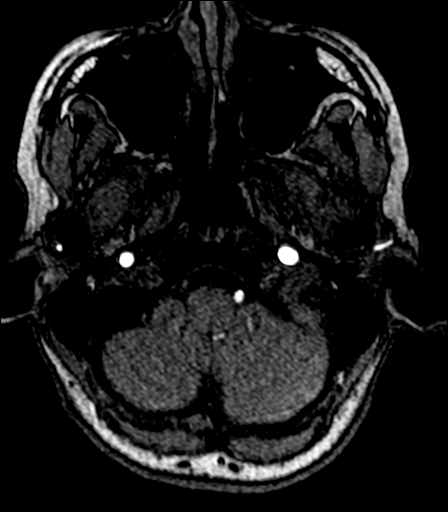
[im 34/143]
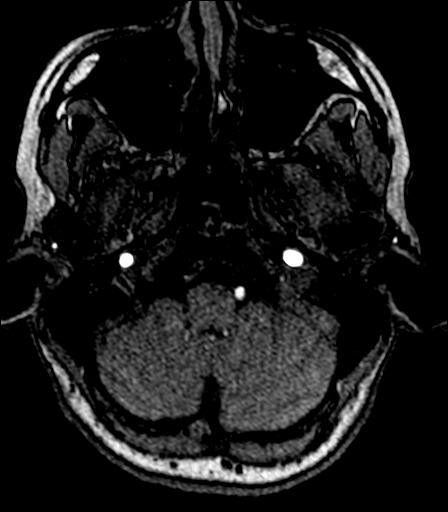
[im 37/143]
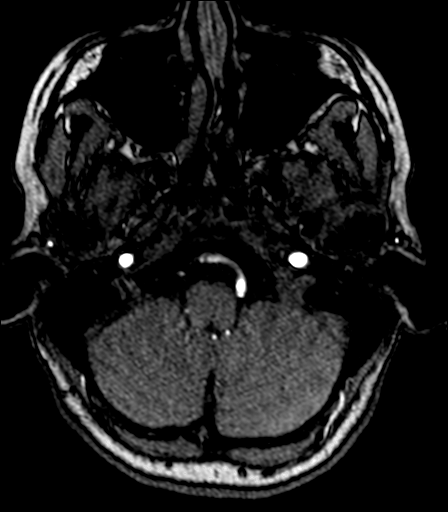
[im 40/143]
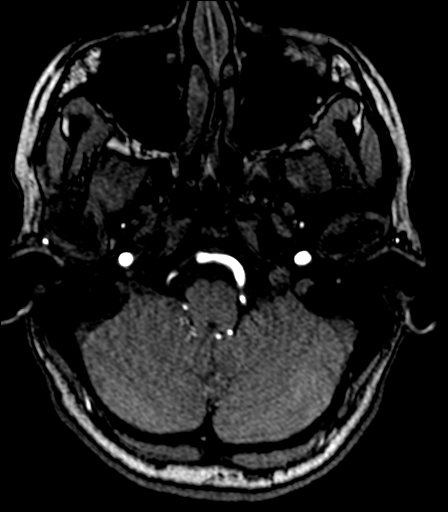
[im 43/143]
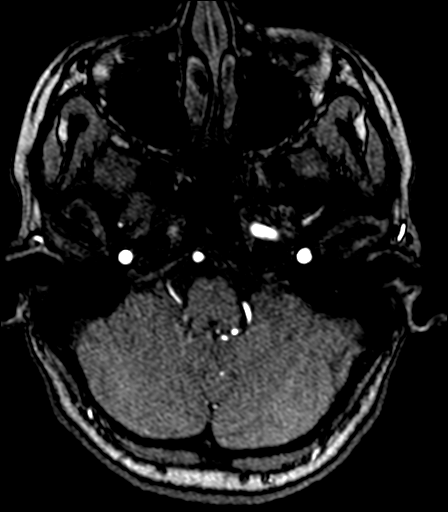
[im 46/143]
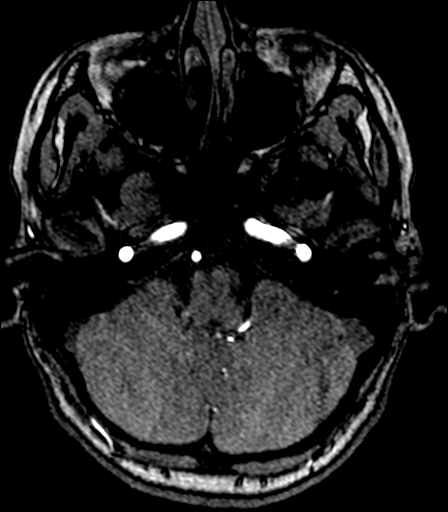
[im 64/143]
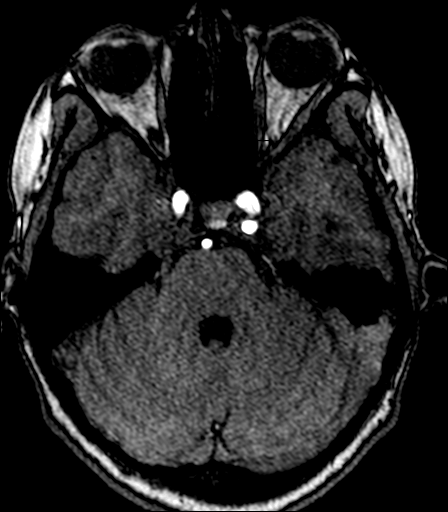
[im 73/143]
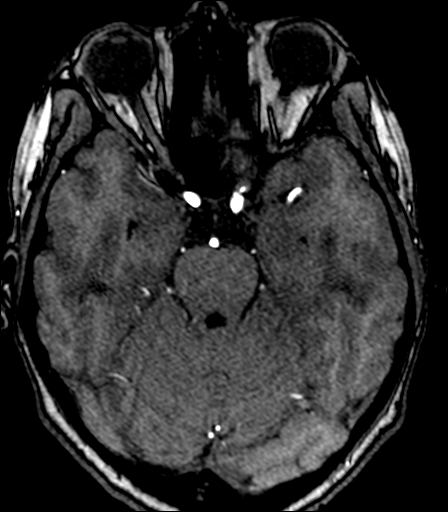
[im 82/143]
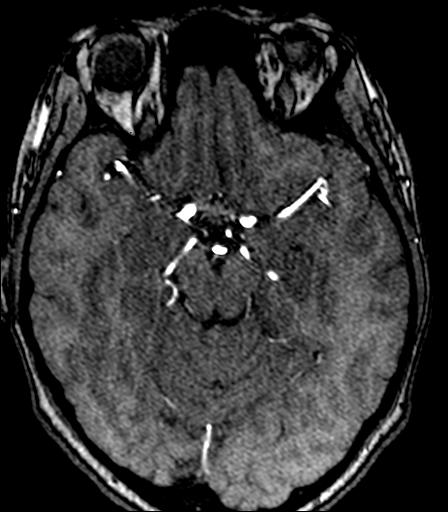
[im 100/143]
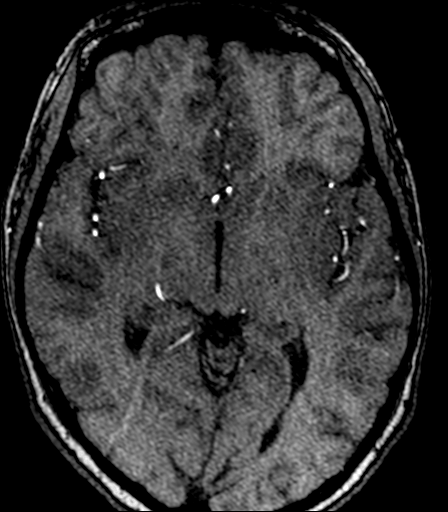
[im 118/143]
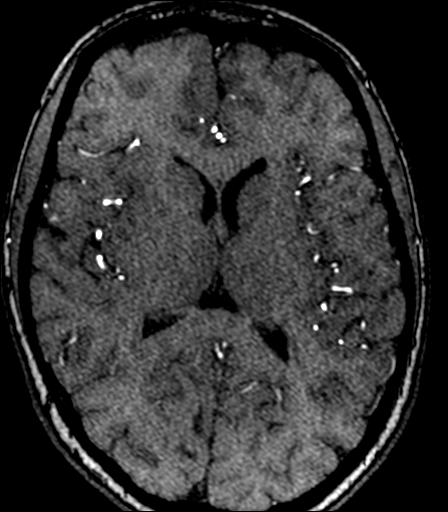
[im 121/143]
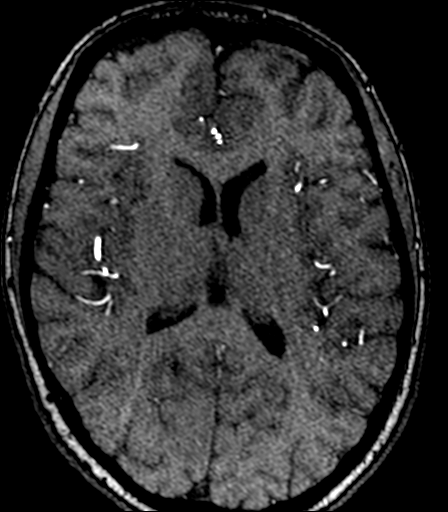
[im 136/143]
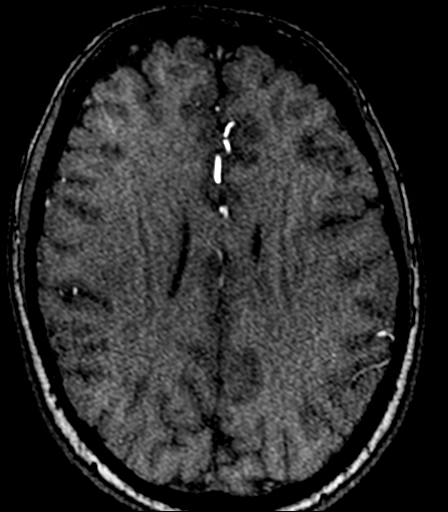

[23 of 48 positions shown; findings below may reference images not displayed]

FINDINGS: Anterior circulation: No large vessel occlusion, proximal
hemodynamically significant stenosis, visible aneurysm.

Posterior circulation: Small right vertebral artery with diminutive
flow related signal intradurally and a small contribution to the
basilar, largely terminating as PICA. The left vertebral artery and
basilar artery are widely patent. Bilateral posterior cerebral
arteries are patent without evidence of a proximal hemodynamically
significant stenosis. Bilateral posterior communicating arteries,
larger on the left. No aneurysm identified.

Anatomic variants: As detailed above.

Other: None.
IMPRESSION: 1. No large vessel occlusion or proximal hemodynamically significant
stenosis.
2. Small right vertebral artery with diminutive flow related signal
intradurally and a small contribution to the basilar (largely
terminating as PICA). Findings are favored to relate to a
non-dominant vertebral artery (anatomy variant) given prominent left
intradural vertebral artery.

## 2021-02-28 MED ORDER — GADOBENATE DIMEGLUMINE 529 MG/ML IV SOLN
12.0000 mL | Freq: Once | INTRAVENOUS | Status: AC | PRN
  Administered 2021-02-28: 12 mL via INTRAVENOUS

## 2021-03-01 ENCOUNTER — Other Ambulatory Visit: Payer: Self-pay

## 2021-03-01 ENCOUNTER — Encounter (HOSPITAL_COMMUNITY)
Admission: RE | Admit: 2021-03-01 | Discharge: 2021-03-01 | Disposition: A | Discharge status: Home / Self Care | Payer: BC Managed Care – PPO | Source: Ambulatory Visit | Attending: Cardiology | Admitting: Cardiology

## 2021-03-01 VITALS — Ht 64.0 in | Wt 131.6 lb

## 2021-03-01 DIAGNOSIS — Z952 Presence of prosthetic heart valve: Secondary | ICD-10-CM | POA: Diagnosis not present

## 2021-03-01 NOTE — Progress Notes (Signed)
Gloria Hamilton has completed the Cardiac rehab program graduating today. PHQ9 score=0 and she said no changes to medications. She has progressed nicely with her exercise program. She will continue exercising at home and then eventually return to the fitness center in which she belongs to when she returns from a planned trip.

## 2021-03-01 NOTE — Progress Notes (Signed)
Discharge Progress Report  Patient Details  Name: Gloria Hamilton MRN: [id number] Date of Birth: Feb 29, 1996 Referring Provider:   Flowsheet Row CARDIAC REHAB PHASE II ORIENTATION from 12/25/2020 in [facility]  Referring Provider Freada Bergeron, MD       Number of Visits: 22  Reason for Discharge:  Patient reached a stable level of exercise. Patient independent in their exercise. Patient has met program and personal goals.  Smoking History:  Social History   Tobacco Use  Smoking Status Never Smoker  Smokeless Tobacco Never Used    Diagnosis:  S/P redo Sternotomy and mechanical Bentall Procedure  ADL UCSD:   Initial Exercise Prescription:  Initial Exercise Prescription - 12/25/20 0900      Date of Initial Exercise RX and Referring Provider   Date 12/25/20    Referring Provider Freada Bergeron, MD    Expected Discharge Date 02/22/21      Treadmill   MPH 3    Grade 0    Minutes 15    METs 3.3      Bike   Level 2    Minutes 15    METs 3      Prescription Details   Frequency (times per week) 3    Duration Progress to 30 minutes of continuous aerobic without signs/symptoms of physical distress      Intensity   THRR 40-80% of Max Heartrate 78-157    Ratings of Perceived Exertion 11-13    Perceived Dyspnea 0-4      Progression   Progression Continue to progress workloads to maintain intensity without signs/symptoms of physical distress.      Resistance Training   Training Prescription Yes    Weight 2 lbs    Reps 10-15           Discharge Exercise Prescription (Final Exercise Prescription Changes):  Exercise Prescription Changes - 03/01/21 0830      Response to Exercise   Blood Pressure (Admit) 94/61    Blood Pressure (Exercise) 97/67    Blood Pressure (Exit) 92/60    Heart Rate (Admit) 95 bpm    Heart Rate (Exercise) 133 bpm    Heart Rate (Exit) 98 bpm    Rating of Perceived Exertion (Exercise) 11     Symptoms None    Comments Pt graduated today    Duration Continue with 30 min of aerobic exercise without signs/symptoms of physical distress.    Intensity THRR unchanged      Progression   Progression Continue to progress workloads to maintain intensity without signs/symptoms of physical distress.    Average METs 6.04      Resistance Training   Training Prescription Yes    Weight 2 lbs wts    Reps 10-15    Time 10 Minutes      Interval Training   Interval Training No      Bike   Level 3   Interval program   Minutes 20    METs 5.2      Rower   Level 3    Minutes 20    METs 5.88      Home Exercise Plan   Plans to continue exercise at Home (comment)    Frequency Add 2 additional days to program exercise sessions.    Initial Home Exercises Provided 01/07/21           Functional Capacity:  6 Minute Walk    Row Name 12/25/20 [phone] 03/01/21 1031  6 Minute Walk   Phase Initial Discharge    Distance 1606 feet 1840 feet    Distance % Change -- 14.57 %    Distance Feet Change -- 234 ft    Walk Time 6 minutes 6 minutes    # of Rest Breaks 0 0    MPH 3.04 3.48    METS 6.03 6.48    RPE 9 11    Perceived Dyspnea  1 0    VO2 Peak 21.12 22.69    Symptoms Yes (comment) No    Comments Patient c/o mild SOB. --    Resting HR 83 bpm 95 bpm    Resting BP 102/70 94/61    Resting Oxygen Saturation  97 % --    Exercise Oxygen Saturation  during 6 min walk 99 % 98 %    Max Ex. HR 115 bpm 133 bpm    Max Ex. BP 107/71 97/67    2 Minute Post BP 101/70 92/60           Psychological, QOL, Others - Outcomes: PHQ 2/9: Depression screen [facility] 2/9 03/01/2021 12/25/2020 12/25/2020 08/09/2020  Decreased Interest 0 0 0 0  Down, Depressed, Hopeless 0 0 0 0  PHQ - 2 Score 0 0 0 0    Quality of Life:  Quality of Life - 02/28/21 1027      Quality of Life Scores   Health/Function Post --    Socioeconomic Post --    Psych/Spiritual Post --    Family Post --    GLOBAL Post --            Personal Goals: Goals established at orientation with interventions provided to work toward goal.  Personal Goals and Risk Factors at Admission - 12/25/20 0958      Core Components/Risk Factors/Patient Goals on Admission   Improve shortness of breath with ADL's Yes    Intervention Provide education, individualized exercise plan and daily activity instruction to help decrease symptoms of SOB with activities of daily living.    Expected Outcomes Short Term: Improve cardiorespiratory fitness to achieve a reduction of symptoms when performing ADLs;Long Term: Be able to perform more ADLs without symptoms or delay the onset of symptoms    Heart Failure Yes    Intervention Provide a combined exercise and nutrition program that is supplemented with education, support and counseling about heart failure. Directed toward relieving symptoms such as shortness of breath, decreased exercise tolerance, and extremity edema.    Expected Outcomes Improve functional capacity of life;Short term: Attendance in program 2-3 days a week with increased exercise capacity. Reported lower sodium intake. Reported increased fruit and vegetable intake. Reports medication compliance.;Short term: Daily weights obtained and reported for increase. Utilizing diuretic protocols set by physician.;Long term: Adoption of self-care skills and reduction of barriers for early signs and symptoms recognition and intervention leading to self-care maintenance.    Stress Yes    Intervention Offer individual and/or small group education and counseling on adjustment to heart disease, stress management and health-related lifestyle change. Teach and support self-help strategies.;Refer participants experiencing significant psychosocial distress to appropriate mental health specialists for further evaluation and treatment. When possible, include family members and significant others in education/counseling sessions.    Expected Outcomes Short  Term: Participant demonstrates changes in health-related behavior, relaxation and other stress management skills, ability to obtain effective social support, and compliance with psychotropic medications if prescribed.;Long Term: Emotional wellbeing is indicated by absence of clinically significant psychosocial distress or  social isolation.            Personal Goals Discharge:  Goals and Risk Factor Review    Row Name 01/03/21 1215 02/04/21 1035 03/08/21 1509         Core Components/Risk Factors/Patient Goals Review   Personal Goals Review Weight Management/Obesity;Improve shortness of breath with ADL's;Heart Failure Weight Management/Obesity;Improve shortness of breath with ADL's;Heart Failure Weight Management/Obesity;Improve shortness of breath with ADL's;Heart Failure     Review Amabel started exercise on 12/31/20 and is off to a good start to exercise Marlicia has been doing well with exercise. Anabela's vital signs are stable Karcyn did well with exercise. Phenix's vital signs were stable. Myeesha completed exercise at cardiac rehab on 03/01/21.     Expected Outcomes Shavy will continue to participate in phase 2 cardiac rehab for exercise nutrtion and lifestyle modifications Macall will continue to participate in phase 2 cardiac rehab for exercise nutrtion and lifestyle modifications Cornell will continue to exercise follow  nutrtion and lifestyle modifications upon completion of phase 2 cardiac rehab.            Exercise Goals and Review:  Exercise Goals    Row Name 12/25/20 0834 01/14/21 0734           Exercise Goals   Increase Physical Activity Yes Yes      Intervention Provide advice, education, support and counseling about physical activity/exercise needs.;Develop an individualized exercise prescription for aerobic and resistive training based on initial evaluation findings, risk stratification, comorbidities and participant's personal goals. Provide advice, education, support and counseling  about physical activity/exercise needs.;Develop an individualized exercise prescription for aerobic and resistive training based on initial evaluation findings, risk stratification, comorbidities and participant's personal goals.      Expected Outcomes Short Term: Attend rehab on a regular basis to increase amount of physical activity.;Long Term: Exercising regularly at least 3-5 days a week.;Long Term: Add in home exercise to make exercise part of routine and to increase amount of physical activity. Long Term: Add in home exercise to make exercise part of routine and to increase amount of physical activity.;Long Term: Exercising regularly at least 3-5 days a week.;Short Term: Attend rehab on a regular basis to increase amount of physical activity.      Increase Strength and Stamina Yes Yes      Intervention Provide advice, education, support and counseling about physical activity/exercise needs.;Develop an individualized exercise prescription for aerobic and resistive training based on initial evaluation findings, risk stratification, comorbidities and participant's personal goals. Provide advice, education, support and counseling about physical activity/exercise needs.;Develop an individualized exercise prescription for aerobic and resistive training based on initial evaluation findings, risk stratification, comorbidities and participant's personal goals.      Expected Outcomes Short Term: Increase workloads from initial exercise prescription for resistance, speed, and METs.;Short Term: Perform resistance training exercises routinely during rehab and add in resistance training at home;Long Term: Improve cardiorespiratory fitness, muscular endurance and strength as measured by increased METs and functional capacity (6MWT) Short Term: Increase workloads from initial exercise prescription for resistance, speed, and METs.;Long Term: Improve cardiorespiratory fitness, muscular endurance and strength as measured by  increased METs and functional capacity (6MWT);Short Term: Perform resistance training exercises routinely during rehab and add in resistance training at home      Able to understand and use rate of perceived exertion (RPE) scale Yes Yes      Intervention Provide education and explanation on how to use RPE scale Provide education and explanation on  how to use RPE scale      Expected Outcomes Short Term: Able to use RPE daily in rehab to express subjective intensity level;Long Term:  Able to use RPE to guide intensity level when exercising independently Short Term: Able to use RPE daily in rehab to express subjective intensity level;Long Term:  Able to use RPE to guide intensity level when exercising independently      Able to understand and use Dyspnea scale -- Yes      Intervention -- Provide education and explanation on how to use Dyspnea scale      Expected Outcomes -- Short Term: Able to use Dyspnea scale daily in rehab to express subjective sense of shortness of breath during exertion;Long Term: Able to use Dyspnea scale to guide intensity level when exercising independently      Knowledge and understanding of Target Heart Rate Range (THRR) Yes Yes      Intervention Provide education and explanation of THRR including how the numbers were predicted and where they are located for reference Provide education and explanation of THRR including how the numbers were predicted and where they are located for reference      Expected Outcomes Short Term: Able to state/look up THRR;Long Term: Able to use THRR to govern intensity when exercising independently;Short Term: Able to use daily as guideline for intensity in rehab Short Term: Able to state/look up THRR;Long Term: Able to use THRR to govern intensity when exercising independently;Short Term: Able to use daily as guideline for intensity in rehab      Able to check pulse independently Yes Yes      Intervention Provide education and demonstration on how to  check pulse in carotid and radial arteries.;Review the importance of being able to check your own pulse for safety during independent exercise Provide education and demonstration on how to check pulse in carotid and radial arteries.;Review the importance of being able to check your own pulse for safety during independent exercise      Expected Outcomes Short Term: Able to explain why pulse checking is important during independent exercise;Long Term: Able to check pulse independently and accurately Short Term: Able to explain why pulse checking is important during independent exercise      Understanding of Exercise Prescription Yes Yes      Intervention Provide education, explanation, and written materials on patient's individual exercise prescription Provide education, explanation, and written materials on patient's individual exercise prescription      Expected Outcomes Short Term: Able to explain program exercise prescription;Long Term: Able to explain home exercise prescription to exercise independently Short Term: Able to explain program exercise prescription;Long Term: Able to explain home exercise prescription to exercise independently             Exercise Goals Re-Evaluation:  Exercise Goals Re-Evaluation    Row Name 12/31/20 0808 01/07/21 0715 01/14/21 0730 01/16/21 0805 02/20/21 0830     Exercise Goal Re-Evaluation   Exercise Goals Review Able to understand and use rate of perceived exertion (RPE) scale;Increase Physical Activity Able to understand and use rate of perceived exertion (RPE) scale;Increase Physical Activity;Understanding of Exercise Prescription;Knowledge and understanding of Target Heart Rate Range (THRR);Able to check pulse independently;Increase Strength and Stamina Able to understand and use rate of perceived exertion (RPE) scale;Increase Physical Activity;Understanding of Exercise Prescription;Knowledge and understanding of Target Heart Rate Range (THRR);Able to check pulse  independently;Increase Strength and Stamina Able to understand and use rate of perceived exertion (RPE) scale;Increase Physical Activity;Understanding of Exercise  Prescription;Knowledge and understanding of Target Heart Rate Range (THRR);Able to check pulse independently;Increase Strength and Stamina Increase Physical Activity;Increase Strength and Stamina;Able to understand and use rate of perceived exertion (RPE) scale;Knowledge and understanding of Target Heart Rate Range (THRR);Understanding of Exercise Prescription   Comments Patient able to understand and use RPE scale appropriately. Patient is not currenlty riding her bike at home, but I encouraged patient to add 2-4 days, 30 minutes riding her bike, and patient is amenable to this. Patient has 3lb weights that she plans to use for her resistance training. Patient has a heart rate monitor and can manually check her pulse. Patient tired today after weekend travel. Patient is not currenlty riding her bike at home, but I encouraged patient to add 1-2 days, 15 minutes riding her bike, and patient is amenable to this. Pt limited by fatigue. Patient rode her [place] yesterday, participating in an online cycling class. Patient exercised for 21 minutes doing intervals in the class at a higher intensity than her pace at [facility]. Per patient, heart rate in the 140s during class, well within her THRR. Reviewed goals and MET's today with the pt. Pt feels like her SOB has become better since she has been in the program, she also feel more reassured that her heart rate is staying within range while she is working out. These were her two big concerns for her. Pt states she feels good about her progress and feels that her time here and her time with PT have helped her gain strength. She is currently exercising at home about 3 days by riding her stationary bike 30-45 minutes a session. She has also been using handweights 2 days a week.   Expected Outcomes  Increase workloads as tolerated to help improve cardiorespiratory fitness. Patient will ride her stationary bike at home 30 minutes, 2-4 days/week in addition to exercise at cardiac rehab to achieve 150 minutes of aerobic exercise/ week. Patient will ride her stationary bike at home 15 minutes, 1-2 days/week in addition to exercise at cardiac rehab to achieve 150 minutes of aerobic exercise/ week. Patient will ride her stationary bike at home 15 minutes, 1-2 days/week in addition to exercise at cardiac rehab to achieve 150 minutes of aerobic exercise/ week. Pt will continue to ride bike at home for 30-45 minutes 3 days a week and use her hand weights 2 days a week. Will continue to monitor pt and progress workloads as tolerated without sign or symptom.   [place] Name 03/01/21 0830             Exercise Goal Re-Evaluation   Exercise Goals Review Increase Physical Activity;Increase Strength and Stamina;Able to understand and use rate of perceived exertion (RPE) scale;Knowledge and understanding of Target Heart Rate Range (THRR);Understanding of Exercise Prescription       Comments Pt graduated today. Pt progressed well through the program and achieved an average MET level of 6.04. She is currently exercising at home about 3 days by riding her stationary bike 30-45 minutes a session. She has also been using handweights 2 days a week.       Expected Outcomes Pt will continue to ride bike at home for 30-45 minutes 3 days a week and use her hand weights 2 days a week.              Nutrition & Weight - Outcomes:  Pre Biometrics - 12/25/20 0844      Pre Biometrics   Waist Circumference 27.75 inches  Hip Circumference 38.25 inches    Waist to Hip Ratio 0.73 %    Triceps Skinfold 15 mm    % Body Fat 27.3 %    Grip Strength 26 kg    Flexibility 9.5 in    Single Leg Stand 30 seconds           Post Biometrics - 03/01/21 1039       Post  Biometrics   Height _0  (1.626 m)    Weight 59.7 kg     Waist Circumference 31.25 inches    Hip Circumference 39 inches    Waist to Hip Ratio 0.8 %    BMI (Calculated) 22.58    Triceps Skinfold 24 mm    % Body Fat 31.1 %    Grip Strength 28 kg    Flexibility 11 in    Single Leg Stand 30 seconds           Nutrition:  Nutrition Therapy & Goals - 01/07/21 0830      Nutrition Therapy   Diet Low sodium    Drug/Food Interactions Coumadin/Vit K      Personal Nutrition Goals   Nutrition Goal Pt to build a healthy plate including vegetables, fruits, whole grains, and low-fat dairy products in a heart healthy meal plan    Personal Goal #2 Pt to reduce sodium intake by cooking at home more often    Personal Goal #3 Pt to continue to have consistent intake of vitamin k rich foods      Intervention Plan   Intervention Prescribe, educate and counsel regarding individualized specific dietary modifications aiming towards targeted core components such as weight, hypertension, lipid management, diabetes, heart failure and other comorbidities.;Nutrition handout(s) given to patient.    Expected Outcomes Short Term Goal: Understand basic principles of dietary content, such as calories, fat, sodium, cholesterol and nutrients.           Nutrition Discharge:   Education Questionnaire Score:  Knowledge Questionnaire Score - 02/28/21 1030      Knowledge Questionnaire Score   Post Score 24/24           Goals reviewed with patient; copy given to patient.Joslyn graduated from cardiac rehab program on 03/01/21 with completion of  exercise sessions in Phase II. Pt maintained good attendance and progressed nicely during his participation in rehab as evidenced by increased MET level.   Medication list reconciled. Repeat  PHQ score- 0 .  Pt has made significant lifestyle changes and should be commended for his success. Pt feels he has achieved his goals during cardiac rehab.   Pt plans to continue to exercise by riding her stationary bike at home. Fama  increased her distance by 234 feet.We are proud of Calyn's progress.Barnet Pall, RN,BSN 03/08/2021 3:13 PM

## 2021-03-05 ENCOUNTER — Other Ambulatory Visit: Payer: Self-pay

## 2021-03-05 ENCOUNTER — Ambulatory Visit (INDEPENDENT_AMBULATORY_CARE_PROVIDER_SITE_OTHER): Payer: BC Managed Care – PPO | Admitting: *Deleted

## 2021-03-05 DIAGNOSIS — Z952 Presence of prosthetic heart valve: Secondary | ICD-10-CM | POA: Diagnosis not present

## 2021-03-05 DIAGNOSIS — Z5181 Encounter for therapeutic drug level monitoring: Secondary | ICD-10-CM

## 2021-03-05 DIAGNOSIS — Z7901 Long term (current) use of anticoagulants: Secondary | ICD-10-CM | POA: Diagnosis not present

## 2021-03-05 DIAGNOSIS — M546 Pain in thoracic spine: Secondary | ICD-10-CM | POA: Diagnosis not present

## 2021-03-05 LAB — POCT INR: INR: 2.9 (ref 2.0–3.0)

## 2021-03-05 NOTE — Patient Instructions (Signed)
Description   Continue taking Warfarin 9.5mg  of warfarin daily. Recheck INR on Friday 5/27. Call coumadin clinic for any changes in medications or upcoming procedures. Coumadin Clinic [phone].

## 2021-03-07 DIAGNOSIS — Z9889 Other specified postprocedural states: Secondary | ICD-10-CM | POA: Diagnosis not present

## 2021-03-07 DIAGNOSIS — Z954 Presence of other heart-valve replacement: Secondary | ICD-10-CM | POA: Diagnosis not present

## 2021-03-07 DIAGNOSIS — M546 Pain in thoracic spine: Secondary | ICD-10-CM | POA: Diagnosis not present

## 2021-03-07 DIAGNOSIS — I351 Nonrheumatic aortic (valve) insufficiency: Secondary | ICD-10-CM | POA: Diagnosis not present

## 2021-03-07 DIAGNOSIS — Z8679 Personal history of other diseases of the circulatory system: Secondary | ICD-10-CM | POA: Diagnosis not present

## 2021-03-07 DIAGNOSIS — I719 Aortic aneurysm of unspecified site, without rupture: Secondary | ICD-10-CM | POA: Diagnosis not present

## 2021-03-08 ENCOUNTER — Emergency Department (HOSPITAL_COMMUNITY)
Admission: EM | Admit: 2021-03-08 | Discharge: 2021-03-08 | Disposition: A | Discharge status: Home / Self Care | Payer: BC Managed Care – PPO | Attending: Emergency Medicine | Admitting: Emergency Medicine

## 2021-03-08 ENCOUNTER — Emergency Department (HOSPITAL_COMMUNITY): Payer: BC Managed Care – PPO

## 2021-03-08 ENCOUNTER — Encounter (HOSPITAL_COMMUNITY): Payer: Self-pay

## 2021-03-08 ENCOUNTER — Ambulatory Visit (INDEPENDENT_AMBULATORY_CARE_PROVIDER_SITE_OTHER): Payer: BC Managed Care – PPO

## 2021-03-08 ENCOUNTER — Other Ambulatory Visit: Payer: Self-pay

## 2021-03-08 DIAGNOSIS — I509 Heart failure, unspecified: Secondary | ICD-10-CM | POA: Insufficient documentation

## 2021-03-08 DIAGNOSIS — Z041 Encounter for examination and observation following transport accident: Secondary | ICD-10-CM | POA: Diagnosis not present

## 2021-03-08 DIAGNOSIS — Y92410 Unspecified street and highway as the place of occurrence of the external cause: Secondary | ICD-10-CM | POA: Insufficient documentation

## 2021-03-08 DIAGNOSIS — S8991XA Unspecified injury of right lower leg, initial encounter: Secondary | ICD-10-CM | POA: Diagnosis not present

## 2021-03-08 DIAGNOSIS — S8011XS Contusion of right lower leg, sequela: Secondary | ICD-10-CM | POA: Insufficient documentation

## 2021-03-08 DIAGNOSIS — Z7982 Long term (current) use of aspirin: Secondary | ICD-10-CM | POA: Insufficient documentation

## 2021-03-08 DIAGNOSIS — Z7901 Long term (current) use of anticoagulants: Secondary | ICD-10-CM

## 2021-03-08 DIAGNOSIS — N390 Urinary tract infection, site not specified: Secondary | ICD-10-CM | POA: Diagnosis not present

## 2021-03-08 DIAGNOSIS — M5031 Other cervical disc degeneration,  high cervical region: Secondary | ICD-10-CM | POA: Diagnosis not present

## 2021-03-08 DIAGNOSIS — Z952 Presence of prosthetic heart valve: Secondary | ICD-10-CM

## 2021-03-08 DIAGNOSIS — Z5181 Encounter for therapeutic drug level monitoring: Secondary | ICD-10-CM

## 2021-03-08 DIAGNOSIS — A499 Bacterial infection, unspecified: Secondary | ICD-10-CM | POA: Diagnosis not present

## 2021-03-08 DIAGNOSIS — R0781 Pleurodynia: Secondary | ICD-10-CM | POA: Diagnosis not present

## 2021-03-08 DIAGNOSIS — R519 Headache, unspecified: Secondary | ICD-10-CM | POA: Diagnosis not present

## 2021-03-08 LAB — POCT INR: INR: 3.8 — AB (ref 2.0–3.0)
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
## 2021-03-08 NOTE — Patient Instructions (Addendum)
Hold today and Saturday only and then Continue taking Warfarin 9.5mg  of warfarin daily. Recheck INR on Friday 6/3. Call coumadin clinic for any changes in medications or upcoming procedures. [facility]in Clinic [phone]. Goal reduced to 1.5-2.5
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
## 2021-03-11 DIAGNOSIS — F330 Major depressive disorder, recurrent, mild: Secondary | ICD-10-CM | POA: Diagnosis not present

## 2021-03-12 DIAGNOSIS — M546 Pain in thoracic spine: Secondary | ICD-10-CM | POA: Diagnosis not present

## 2021-03-14 DIAGNOSIS — M546 Pain in thoracic spine: Secondary | ICD-10-CM | POA: Diagnosis not present

## 2021-03-15 ENCOUNTER — Ambulatory Visit (INDEPENDENT_AMBULATORY_CARE_PROVIDER_SITE_OTHER): Payer: BC Managed Care – PPO | Admitting: Pharmacist

## 2021-03-15 ENCOUNTER — Other Ambulatory Visit: Payer: Self-pay

## 2021-03-15 DIAGNOSIS — Z952 Presence of prosthetic heart valve: Secondary | ICD-10-CM

## 2021-03-15 DIAGNOSIS — Z5181 Encounter for therapeutic drug level monitoring: Secondary | ICD-10-CM | POA: Diagnosis not present

## 2021-03-15 DIAGNOSIS — Z7901 Long term (current) use of anticoagulants: Secondary | ICD-10-CM

## 2021-03-15 LAB — POCT INR: INR: 1.9 — AB (ref 2.0–3.0)

## 2021-03-15 NOTE — Patient Instructions (Signed)
Description   Continue taking Warfarin 9.5mg  of warfarin daily. Recheck INR on Friday 6/9 before trip. Call coumadin clinic for any changes in medications or upcoming procedures. Coumadin Clinic [phone]. Goal reduced to 1.5-2.5

## 2021-03-21 ENCOUNTER — Other Ambulatory Visit: Payer: Self-pay

## 2021-03-21 ENCOUNTER — Ambulatory Visit (INDEPENDENT_AMBULATORY_CARE_PROVIDER_SITE_OTHER): Payer: BC Managed Care – PPO | Admitting: *Deleted

## 2021-03-21 DIAGNOSIS — Z7901 Long term (current) use of anticoagulants: Secondary | ICD-10-CM

## 2021-03-21 DIAGNOSIS — Z5181 Encounter for therapeutic drug level monitoring: Secondary | ICD-10-CM

## 2021-03-21 DIAGNOSIS — Z952 Presence of prosthetic heart valve: Secondary | ICD-10-CM | POA: Diagnosis not present

## 2021-03-21 LAB — POCT INR: INR: 3.3 — AB (ref 2.0–3.0)

## 2021-03-21 NOTE — Patient Instructions (Signed)
Description   Hold warfarin today and then start taking warfarin 9.5mg  daily except for 7.5mg  on Tuesdays and Saturdays. Recheck INR in 2-3 weeks. [facility]in Clinic [phone].

## 2021-04-08 ENCOUNTER — Ambulatory Visit (INDEPENDENT_AMBULATORY_CARE_PROVIDER_SITE_OTHER): Payer: BC Managed Care – PPO | Admitting: Pharmacist

## 2021-04-08 ENCOUNTER — Other Ambulatory Visit: Payer: Self-pay

## 2021-04-08 DIAGNOSIS — Z7901 Long term (current) use of anticoagulants: Secondary | ICD-10-CM

## 2021-04-08 DIAGNOSIS — Z5181 Encounter for therapeutic drug level monitoring: Secondary | ICD-10-CM | POA: Diagnosis not present

## 2021-04-08 DIAGNOSIS — Z952 Presence of prosthetic heart valve: Secondary | ICD-10-CM | POA: Diagnosis not present

## 2021-04-08 LAB — POCT INR: INR: 2.3 (ref 2.0–3.0)

## 2021-04-08 NOTE — Patient Instructions (Signed)
Continue taking warfarin 9.5mg  daily except for 7.5mg  on Tuesdays and Saturdays. Recheck INR in 2 weeks. Coumadin Clinic [phone].

## 2021-04-18 ENCOUNTER — Telehealth: Payer: Self-pay | Admitting: Cardiology

## 2021-04-18 ENCOUNTER — Ambulatory Visit (INDEPENDENT_AMBULATORY_CARE_PROVIDER_SITE_OTHER): Payer: BC Managed Care – PPO | Admitting: *Deleted

## 2021-04-18 ENCOUNTER — Other Ambulatory Visit: Payer: Self-pay

## 2021-04-18 DIAGNOSIS — Z5181 Encounter for therapeutic drug level monitoring: Secondary | ICD-10-CM | POA: Diagnosis not present

## 2021-04-18 DIAGNOSIS — Z952 Presence of prosthetic heart valve: Secondary | ICD-10-CM | POA: Diagnosis not present

## 2021-04-18 DIAGNOSIS — Z7901 Long term (current) use of anticoagulants: Secondary | ICD-10-CM

## 2021-04-18 LAB — POCT INR: INR: 2.3 (ref 2.0–3.0)

## 2021-04-18 NOTE — Telephone Encounter (Signed)
Spoke with pt and gathered information and she will come in to have INR checked at 1130am today. We are aware of the dental antibiotics that are safe to take with warfarin. Also, advised pt that she needs to follow up with dentist as we do not treat dental issues in the Cardiology office.

## 2021-04-18 NOTE — Patient Instructions (Signed)
Description   Continue taking warfarin 9.5mg  daily except for 7.5mg  on Tuesdays and Saturdays. Recheck INR in 3 weeks. Coumadin Clinic [phone].

## 2021-04-18 NOTE — Telephone Encounter (Signed)
    Pt said she had her first dental cleaning after her open heart surgery, she said she was ask to take antibiotics prior dental cleaning but took her blood thinner as prescribed, she said this morning she had a big blood clot in her mouth and her gums is actively bleeding right now.

## 2021-04-29 DIAGNOSIS — F330 Major depressive disorder, recurrent, mild: Secondary | ICD-10-CM | POA: Diagnosis not present

## 2021-05-01 ENCOUNTER — Other Ambulatory Visit: Payer: Self-pay | Admitting: *Deleted

## 2021-05-01 MED ORDER — WARFARIN SODIUM 1 MG PO TABS: ORAL_TABLET | ORAL | 3 refills | Status: DC

## 2021-05-09 ENCOUNTER — Other Ambulatory Visit: Payer: Self-pay

## 2021-05-09 ENCOUNTER — Ambulatory Visit (INDEPENDENT_AMBULATORY_CARE_PROVIDER_SITE_OTHER): Payer: BC Managed Care – PPO | Admitting: *Deleted

## 2021-05-09 DIAGNOSIS — Z5181 Encounter for therapeutic drug level monitoring: Secondary | ICD-10-CM

## 2021-05-09 DIAGNOSIS — Z7901 Long term (current) use of anticoagulants: Secondary | ICD-10-CM

## 2021-05-09 DIAGNOSIS — Z952 Presence of prosthetic heart valve: Secondary | ICD-10-CM

## 2021-05-09 LAB — POCT INR: INR: 1.6 — AB (ref 2.0–3.0)

## 2021-05-09 MED ORDER — WARFARIN SODIUM 7.5 MG PO TABS: ORAL_TABLET | ORAL | 1 refills | Status: DC

## 2021-05-09 NOTE — Patient Instructions (Signed)
Description   Continue taking warfarin 9.5mg  daily except for 7.5mg  on Tuesdays and Saturdays. Recheck INR in 4 weeks. Coumadin Clinic [phone].

## 2021-05-20 DIAGNOSIS — F330 Major depressive disorder, recurrent, mild: Secondary | ICD-10-CM | POA: Diagnosis not present

## 2021-06-03 DIAGNOSIS — F330 Major depressive disorder, recurrent, mild: Secondary | ICD-10-CM | POA: Diagnosis not present

## 2021-06-07 ENCOUNTER — Ambulatory Visit (HOSPITAL_COMMUNITY)
Admission: EM | Admit: 2021-06-07 | Discharge: 2021-06-07 | Disposition: A | Discharge status: Home / Self Care | Payer: BC Managed Care – PPO | Attending: Student | Admitting: Student

## 2021-06-07 ENCOUNTER — Encounter (HOSPITAL_COMMUNITY): Payer: Self-pay | Admitting: Emergency Medicine

## 2021-06-07 DIAGNOSIS — N3000 Acute cystitis without hematuria: Secondary | ICD-10-CM | POA: Diagnosis not present

## 2021-06-07 DIAGNOSIS — N390 Urinary tract infection, site not specified: Secondary | ICD-10-CM | POA: Diagnosis not present

## 2021-06-07 LAB — POCT URINALYSIS DIPSTICK, ED / UC
Bilirubin Urine: NEGATIVE
Glucose, UA: NEGATIVE mg/dL
Hgb urine dipstick: NEGATIVE
Ketones, ur: NEGATIVE mg/dL
Nitrite: NEGATIVE
Protein, ur: NEGATIVE mg/dL
Specific Gravity, Urine: 1.015 (ref 1.005–1.030)
Urobilinogen, UA: 0.2 mg/dL (ref 0.0–1.0)
pH: 6 (ref 5.0–8.0)

## 2021-06-07 LAB — POC URINE PREG, ED: Preg Test, Ur: NEGATIVE

## 2021-06-07 MED ORDER — NITROFURANTOIN MONOHYD MACRO 100 MG PO CAPS: 100.0000 mg | ORAL_CAPSULE | Freq: Two times a day (BID) | ORAL | 0 refills | Status: DC

## 2021-06-07 NOTE — ED Triage Notes (Signed)
Pt presents with urinary frequency, lower abdominal pain and weak urinary stream xs 4 days.

## 2021-06-07 NOTE — Discharge Instructions (Addendum)
-  Macrobid twice daily x5 days -Drink plenty of fluids -Seek additional immediate medical attention if symptoms are getting worse, like abdominal pain, back pain, new fever/chills, etc.

## 2021-06-07 NOTE — ED Provider Notes (Signed)
No dysuria [facility]    CSN: [id number] Arrival date & time: 06/07/21  1803      History   Chief Complaint Chief Complaint  Patient presents with   Urinary Frequency   Abdominal Pain    HPI Gloria Hamilton is a 25 y.o. female presenting with urinary symptoms for about 4 days.  Medical history bicuspid aortic valve status post replacement.  Frequency, lower abdominal pain, weak urinary stream for about 4 days.  States she has had a UTI once monthly since her valve surgery about 1 year ago.  She did have a catheter during the surgery.  She is establishing care with a urogynecologist in about 1 month.  States that her UTIs avoids been E. coli in the past.  Denies STI risk, vaginal symptoms. Denies hematuria, dysuria, urgency, back pain, n/v/d, fevers/chills, abdnormal vaginal discharge.    HPI  Past Medical History:  Diagnosis Date   Anxiety    Bicuspid aortic valve    CHF (congestive heart failure) (HCC)    Congenital aortic insufficiency    H/O aortic valve replacement    Heart murmur    Migraines    Transposition of great vessels     Patient Active Problem List   Diagnosis Date Noted   Encounter for therapeutic drug monitoring 11/15/2020   Aortic valve replaced 11/15/2020    Past Surgical History:  Procedure Laterality Date   BENTALL PROCEDURE  05/17/2020   S/P Aortic Aneurysm repair , Redo thoracotomy at [facility] Dr Kizzie Bane   CYST EXCISION     pilonodal   Tenny Craw Procedure      OB History   No obstetric history on file.      Home Medications    Prior to Admission medications   Medication Sig Start Date End Date Taking? Authorizing Provider  nitrofurantoin, macrocrystal-monohydrate, (MACROBID) 100 MG capsule Take 1 capsule (100 mg total) by mouth 2 (two) times daily. 06/07/21  Yes Rhys Martini, PA-C  acetaminophen (TYLENOL) 325 MG tablet 1 tablet as needed    [provider]  metoprolol succinate (TOPROL XL) 25 MG 24 hr tablet Take 0.5  tablets (12.5 mg total) by mouth every evening. 11/15/20   Meriam Sprague, MD  norethindrone (CAMILA) 0.35 MG tablet Take 1 tablet (0.35 mg total) by mouth daily. 07/16/18   Rasch, Victorino Dike I, NP  warfarin (COUMADIN) 1 MG tablet Take 2 tablets (2mg ) by mouth daily along with your warfarin 7.5mg  tablet  as instructed by the Anticoagulation Clinic. 05/01/21   05/03/21, MD  warfarin (COUMADIN) 7.5 MG tablet Take 1 tablet (7.5mg ) by mouth daily as instructed by the coumadin clinic. 05/09/21   05/11/21, MD    Family History History reviewed. No pertinent family history.  Social History Social History   Tobacco Use   Smoking status: Never   Smokeless tobacco: Never  Vaping Use   Vaping Use: Never used  Substance Use Topics   Alcohol use: No   Drug use: No     Allergies   Ceftriaxone, Ciprofloxacin, and Lisinopril   Review of Systems Review of Systems  Constitutional:  Negative for appetite change, chills, diaphoresis and fever.  Respiratory:  Negative for shortness of breath.   Cardiovascular:  Negative for chest pain.  Gastrointestinal:  Positive for abdominal pain. Negative for blood in stool, constipation, diarrhea, nausea and vomiting.  Genitourinary:  Positive for frequency. Negative for decreased urine volume, difficulty urinating, dysuria, flank pain, genital sores, hematuria and  urgency.  Musculoskeletal:  Negative for back pain.  Neurological:  Negative for dizziness, weakness and light-headedness.  All other systems reviewed and are negative.   Physical Exam Triage Vital Signs ED Triage Vitals  Enc Vitals Group     BP 06/07/21 1909 96/63     Pulse Rate 06/07/21 1909 85     Resp 06/07/21 1909 16     Temp 06/07/21 1909 98.5 F (36.9 C)     Temp Source 06/07/21 1909 Oral     SpO2 06/07/21 1909 100 %     Weight --      Height --      Head Circumference --      Peak Flow --      Pain Score 06/07/21 1907 1     Pain Loc --      Pain Edu?  --      Excl. in GC? --    No data found.  Updated Vital Signs BP 96/63 (BP Location: Left Arm)   Pulse 85   Temp 98.5 F (36.9 C) (Oral)   Resp 16   LMP 05/22/2021   SpO2 100%   Visual Acuity Right Eye Distance:   Left Eye Distance:   Bilateral Distance:    Right Eye Near:   Left Eye Near:    Bilateral Near:     Physical Exam Vitals reviewed.  Constitutional:      General: She is not in acute distress.    Appearance: Normal appearance. She is not ill-appearing.  HENT:     Head: Normocephalic and atraumatic.     Mouth/Throat:     Mouth: Mucous membranes are moist.     Comments: Moist mucous membranes Eyes:     Extraocular Movements: Extraocular movements intact.     Pupils: Pupils are equal, round, and reactive to light.  Cardiovascular:     Rate and Rhythm: Normal rate and regular rhythm.     Heart sounds: Normal heart sounds.  Pulmonary:     Effort: Pulmonary effort is normal.     Breath sounds: Normal breath sounds. No wheezing, rhonchi or rales.  Abdominal:     General: Bowel sounds are normal. There is no distension.     Palpations: Abdomen is soft. There is no mass.     Tenderness: There is abdominal tenderness in the suprapubic area. There is no right CVA tenderness, left CVA tenderness, guarding or rebound.  Skin:    General: Skin is warm.     Capillary Refill: Capillary refill takes less than 2 seconds.     Comments: Good skin turgor  Neurological:     General: No focal deficit present.     Mental Status: She is alert and oriented to person, place, and time.  Psychiatric:        Mood and Affect: Mood normal.        Behavior: Behavior normal.     UC Treatments / Results  Labs (all labs ordered are listed, but only abnormal results are displayed) Labs Reviewed  POCT URINALYSIS DIPSTICK, ED / UC - Abnormal; Notable for the following components:      Result Value   Leukocytes,Ua TRACE (*)    All other components within normal limits  URINE  CULTURE  POC URINE PREG, ED    EKG   Radiology No results found.  Procedures Procedures (including critical care time)  Medications Ordered in UC Medications - No data to display  Initial Impression / Assessment and Plan /  UC Course  I have reviewed the triage vital signs and the nursing notes.  Pertinent labs & imaging results that were available during my care of the patient were reviewed by me and considered in my medical decision making (see chart for details).     This patient is a very pleasant 25 y.o. year old female presenting with recurrent cystitis. Suprapubic pressure but no CVAT. Afebrile, nontachy. Last UTI one month ago, E coli per pt.  Urine pregnancy negative UA with trace leuk, otherwise wnl. Culture sent.   Pt is cipro allergic, penicillin allergic, and cannot tolerate bactrim due to interaction with warfarin. Will treat with macrobid. Good hydration.  F/u with urogyn as scheduled in 1 month.   ED return precautions discussed. Patient verbalizes understanding and agreement.   Coding Level 4 for review of past notes/labs, order and interpretation of labs today, and prescription drug management   Final Clinical Impressions(s) / UC Diagnoses   Final diagnoses:  Acute cystitis without hematuria  Recurrent UTI     Discharge Instructions      -Macrobid twice daily x5 days -Drink plenty of fluids -Seek additional immediate medical attention if symptoms are getting worse, like abdominal pain, back pain, new fever/chills, etc.     ED Prescriptions     Medication Sig Dispense Auth. Provider   nitrofurantoin, macrocrystal-monohydrate, (MACROBID) 100 MG capsule Take 1 capsule (100 mg total) by mouth 2 (two) times daily. 10 capsule Rhys Martini, PA-C      PDMP not reviewed this encounter.   Rhys Martini, PA-C 06/07/21 1935

## 2021-06-09 LAB — URINE CULTURE

## 2021-06-10 ENCOUNTER — Ambulatory Visit (INDEPENDENT_AMBULATORY_CARE_PROVIDER_SITE_OTHER): Payer: BC Managed Care – PPO

## 2021-06-10 ENCOUNTER — Other Ambulatory Visit: Payer: Self-pay

## 2021-06-10 DIAGNOSIS — Z7901 Long term (current) use of anticoagulants: Secondary | ICD-10-CM

## 2021-06-10 DIAGNOSIS — Z952 Presence of prosthetic heart valve: Secondary | ICD-10-CM

## 2021-06-10 DIAGNOSIS — Z5181 Encounter for therapeutic drug level monitoring: Secondary | ICD-10-CM

## 2021-06-10 LAB — POCT INR: INR: 2.1 (ref 2.0–3.0)

## 2021-06-10 NOTE — Patient Instructions (Signed)
Description   Continue taking warfarin 9.5mg daily except for 7.5mg on Tuesdays and Saturdays. Recheck INR in 4 weeks. Coumadin Clinic [phone].      

## 2021-06-18 DIAGNOSIS — N390 Urinary tract infection, site not specified: Secondary | ICD-10-CM | POA: Diagnosis not present

## 2021-06-24 DIAGNOSIS — F330 Major depressive disorder, recurrent, mild: Secondary | ICD-10-CM | POA: Diagnosis not present

## 2021-07-01 DIAGNOSIS — F411 Generalized anxiety disorder: Secondary | ICD-10-CM | POA: Diagnosis not present

## 2021-07-04 ENCOUNTER — Emergency Department (HOSPITAL_COMMUNITY): Payer: BC Managed Care – PPO

## 2021-07-04 ENCOUNTER — Encounter (HOSPITAL_COMMUNITY): Payer: Self-pay | Admitting: Emergency Medicine

## 2021-07-04 ENCOUNTER — Other Ambulatory Visit: Payer: Self-pay

## 2021-07-04 ENCOUNTER — Inpatient Hospital Stay (HOSPITAL_COMMUNITY)
Admission: EM | Admit: 2021-07-04 | Discharge: 2021-07-07 | DRG: 393 | Disposition: A | Discharge status: Home / Self Care | Payer: BC Managed Care – PPO | Attending: Pulmonary Disease | Admitting: Pulmonary Disease

## 2021-07-04 DIAGNOSIS — R55 Syncope and collapse: Secondary | ICD-10-CM | POA: Diagnosis not present

## 2021-07-04 DIAGNOSIS — Z952 Presence of prosthetic heart valve: Secondary | ICD-10-CM | POA: Diagnosis not present

## 2021-07-04 DIAGNOSIS — Z87440 Personal history of urinary (tract) infections: Secondary | ICD-10-CM | POA: Diagnosis not present

## 2021-07-04 DIAGNOSIS — D649 Anemia, unspecified: Secondary | ICD-10-CM | POA: Diagnosis not present

## 2021-07-04 DIAGNOSIS — D62 Acute posthemorrhagic anemia: Secondary | ICD-10-CM | POA: Diagnosis present

## 2021-07-04 DIAGNOSIS — Z79899 Other long term (current) drug therapy: Secondary | ICD-10-CM

## 2021-07-04 DIAGNOSIS — Z20822 Contact with and (suspected) exposure to covid-19: Secondary | ICD-10-CM | POA: Diagnosis not present

## 2021-07-04 DIAGNOSIS — R197 Diarrhea, unspecified: Secondary | ICD-10-CM | POA: Diagnosis present

## 2021-07-04 DIAGNOSIS — Z888 Allergy status to other drugs, medicaments and biological substances status: Secondary | ICD-10-CM

## 2021-07-04 DIAGNOSIS — N83209 Unspecified ovarian cyst, unspecified side: Secondary | ICD-10-CM | POA: Diagnosis not present

## 2021-07-04 DIAGNOSIS — K661 Hemoperitoneum: Principal | ICD-10-CM | POA: Diagnosis present

## 2021-07-04 DIAGNOSIS — Z7982 Long term (current) use of aspirin: Secondary | ICD-10-CM

## 2021-07-04 DIAGNOSIS — R578 Other shock: Secondary | ICD-10-CM | POA: Diagnosis not present

## 2021-07-04 DIAGNOSIS — Z8774 Personal history of (corrected) congenital malformations of heart and circulatory system: Secondary | ICD-10-CM

## 2021-07-04 DIAGNOSIS — Z7901 Long term (current) use of anticoagulants: Secondary | ICD-10-CM

## 2021-07-04 DIAGNOSIS — R791 Abnormal coagulation profile: Secondary | ICD-10-CM | POA: Diagnosis present

## 2021-07-04 DIAGNOSIS — R110 Nausea: Secondary | ICD-10-CM | POA: Diagnosis not present

## 2021-07-04 DIAGNOSIS — N83291 Other ovarian cyst, right side: Secondary | ICD-10-CM | POA: Diagnosis not present

## 2021-07-04 DIAGNOSIS — Z881 Allergy status to other antibiotic agents status: Secondary | ICD-10-CM

## 2021-07-04 DIAGNOSIS — T45515A Adverse effect of anticoagulants, initial encounter: Secondary | ICD-10-CM | POA: Diagnosis not present

## 2021-07-04 DIAGNOSIS — R000 Tachycardia, unspecified: Secondary | ICD-10-CM | POA: Diagnosis not present

## 2021-07-04 DIAGNOSIS — R1111 Vomiting without nausea: Secondary | ICD-10-CM | POA: Diagnosis not present

## 2021-07-04 DIAGNOSIS — R1084 Generalized abdominal pain: Secondary | ICD-10-CM | POA: Diagnosis not present

## 2021-07-04 DIAGNOSIS — S300XXA Contusion of lower back and pelvis, initial encounter: Secondary | ICD-10-CM | POA: Diagnosis not present

## 2021-07-04 DIAGNOSIS — I959 Hypotension, unspecified: Secondary | ICD-10-CM | POA: Diagnosis not present

## 2021-07-04 DIAGNOSIS — R109 Unspecified abdominal pain: Secondary | ICD-10-CM | POA: Diagnosis not present

## 2021-07-04 LAB — CBC
HCT: 30.4 % — ABNORMAL LOW (ref 36.0–46.0)
HCT: 34 % — ABNORMAL LOW (ref 36.0–46.0)
Hemoglobin: 9.8 g/dL — ABNORMAL LOW (ref 12.0–15.0)
Hemoglobin: 11.9 g/dL — ABNORMAL LOW (ref 12.0–15.0)
MCH: 31.3 pg (ref 26.0–34.0)
MCH: 31.1 pg (ref 26.0–34.0)
MCHC: 32.2 g/dL (ref 30.0–36.0)
MCHC: 35 g/dL (ref 30.0–36.0)
MCV: 97.1 fL (ref 80.0–100.0)
MCV: 88.8 fL (ref 80.0–100.0)
Platelets: 168 10*3/uL (ref 150–400)
Platelets: 144 10*3/uL — ABNORMAL LOW (ref 150–400)
RBC: 3.13 MIL/uL — ABNORMAL LOW (ref 3.87–5.11)
RBC: 3.83 MIL/uL — ABNORMAL LOW (ref 3.87–5.11)
RDW: 12.7 % (ref 11.5–15.5)
RDW: 16.1 % — ABNORMAL HIGH (ref 11.5–15.5)
WBC: 7 10*3/uL (ref 4.0–10.5)
WBC: 11.2 10*3/uL — ABNORMAL HIGH (ref 4.0–10.5)
nRBC: 0 % (ref 0.0–0.2)
nRBC: 0 % (ref 0.0–0.2)

## 2021-07-04 LAB — LACTIC ACID, PLASMA
Lactic Acid, Venous: 2 mmol/L (ref 0.5–1.9)
Lactic Acid, Venous: 1.9 mmol/L (ref 0.5–1.9)

## 2021-07-04 LAB — PROTIME-INR
INR: 3.2 — ABNORMAL HIGH (ref 0.8–1.2)
INR: 1.2 (ref 0.8–1.2)
Prothrombin Time: 32.9 seconds — ABNORMAL HIGH (ref 11.4–15.2)
Prothrombin Time: 15.5 seconds — ABNORMAL HIGH (ref 11.4–15.2)

## 2021-07-04 LAB — I-STAT BETA HCG BLOOD, ED (MC, WL, AP ONLY): I-stat hCG, quantitative: <5 m[IU]/mL (ref <5)

## 2021-07-04 LAB — URINALYSIS, ROUTINE W REFLEX MICROSCOPIC
Bilirubin Urine: NEGATIVE
Glucose, UA: NEGATIVE mg/dL
Hgb urine dipstick: NEGATIVE
Ketones, ur: 5 mg/dL — AB
Leukocytes,Ua: NEGATIVE
Nitrite: NEGATIVE
Protein, ur: NEGATIVE mg/dL
Specific Gravity, Urine: >1.046 — ABNORMAL HIGH (ref 1.005–1.030)
pH: 8 (ref 5.0–8.0)

## 2021-07-04 LAB — COMPREHENSIVE METABOLIC PANEL
ALT: 10 U/L (ref 0–44)
AST: 16 U/L (ref 15–41)
Albumin: 3.4 g/dL — ABNORMAL LOW (ref 3.5–5.0)
Alkaline Phosphatase: 33 U/L — ABNORMAL LOW (ref 38–126)
Anion gap: 3 — ABNORMAL LOW (ref 5–15)
BUN: 8 mg/dL (ref 6–20)
CO2: 26 mmol/L (ref 22–32)
Calcium: 7.6 mg/dL — ABNORMAL LOW (ref 8.9–10.3)
Chloride: 108 mmol/L (ref 98–111)
Creatinine, Ser: 0.78 mg/dL (ref 0.44–1.00)
GFR, Estimated: >60 mL/min (ref >60)
Glucose, Bld: 133 mg/dL — ABNORMAL HIGH (ref 70–99)
Potassium: 3.8 mmol/L (ref 3.5–5.1)
Sodium: 137 mmol/L (ref 135–145)
Total Bilirubin: 0.8 mg/dL (ref 0.3–1.2)
Total Protein: 4.8 g/dL — ABNORMAL LOW (ref 6.5–8.1)

## 2021-07-04 LAB — GLUCOSE, CAPILLARY: Glucose-Capillary: 123 mg/dL — ABNORMAL HIGH (ref 70–99)

## 2021-07-04 LAB — RESP PANEL BY RT-PCR (FLU A&B, COVID) ARPGX2
Influenza A by PCR: NEGATIVE
Influenza B by PCR: NEGATIVE
SARS Coronavirus 2 by RT PCR: NEGATIVE

## 2021-07-04 LAB — MRSA NEXT GEN BY PCR, NASAL: MRSA by PCR Next Gen: NOT DETECTED

## 2021-07-04 LAB — ABO/RH: ABO/RH(D): AB POS

## 2021-07-04 LAB — PREPARE RBC (CROSSMATCH)

## 2021-07-04 LAB — HIV ANTIBODY (ROUTINE TESTING W REFLEX): HIV Screen 4th Generation wRfx: NONREACTIVE

## 2021-07-04 LAB — POC OCCULT BLOOD, ED: Fecal Occult Bld: NEGATIVE

## 2021-07-04 LAB — LIPASE, BLOOD: Lipase: 32 U/L (ref 11–51)

## 2021-07-04 IMAGING — CT CT ANGIO CHEST-ABD-PELV FOR DISSECTION W/ AND WO/W CM
2 of 7 series · 13 of 46 positions shown, 15 images · IV contrast (omnipaque)
Comparison: None.

CLINICAL DATA: Acute right chest and abdominal pain, hypotension,
tachycardia, near syncopal episode

EXAM:
CT ANGIOGRAPHY CHEST, ABDOMEN AND PELVIS
TECHNIQUE: Multidetector CT imaging through the chest, abdomen and pelvis was
performed using the standard protocol during bolus administration of
intravenous contrast. Multiplanar reconstructed images and MIPs were
obtained and reviewed to evaluate the vascular anatomy.
CONTRAST:  100mL OMNIPAQUE IOHEXOL 350 MG/ML SOLN

[Series 6: dissection 2mm · axial · 0.82mm/px · z∈[+765,+1337]mm · 10 of 320 slices shown, 12 images]
[im 17/320  soft-tissue]
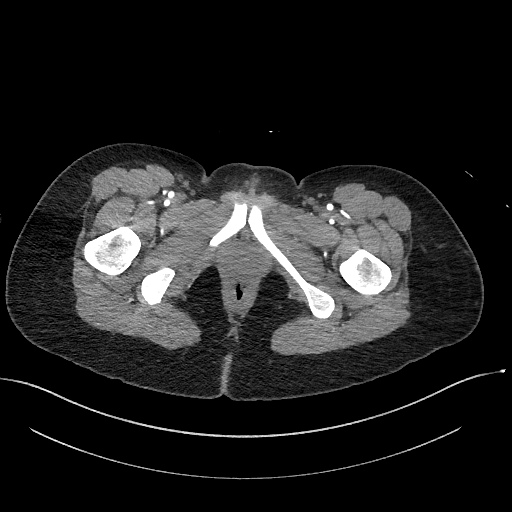
[im 17/320  bone]
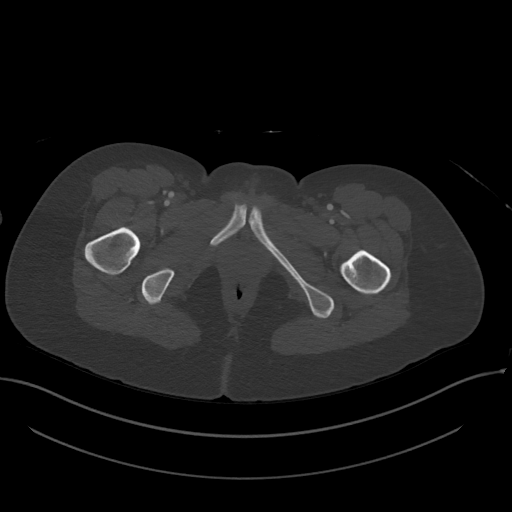
[im 51/320  soft-tissue]
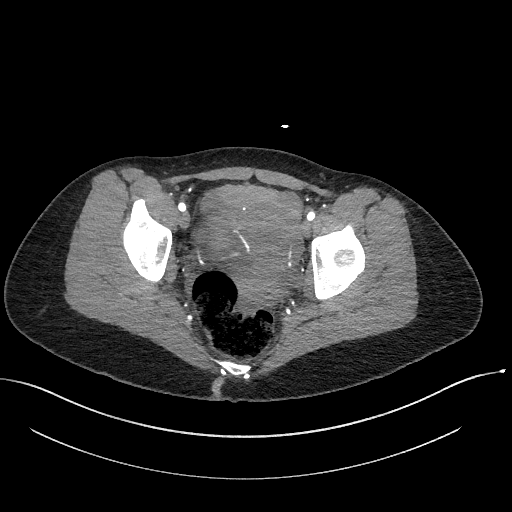
[im 84/320  soft-tissue]
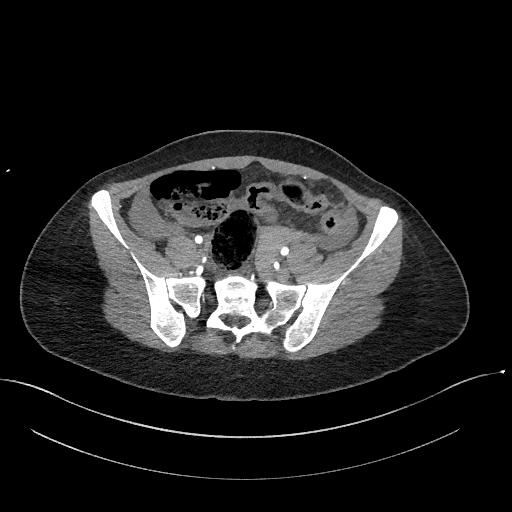
[im 118/320  soft-tissue]
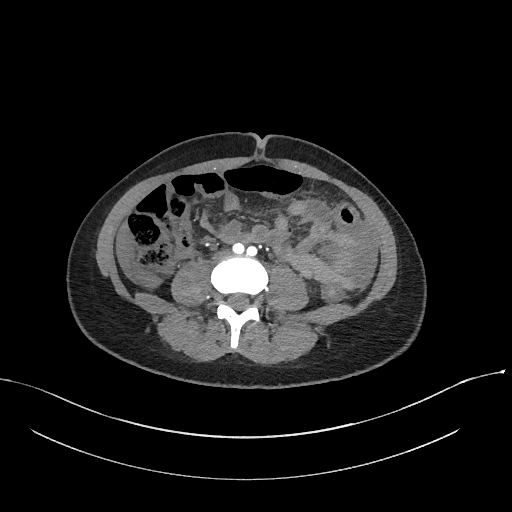
[im 152/320  soft-tissue]
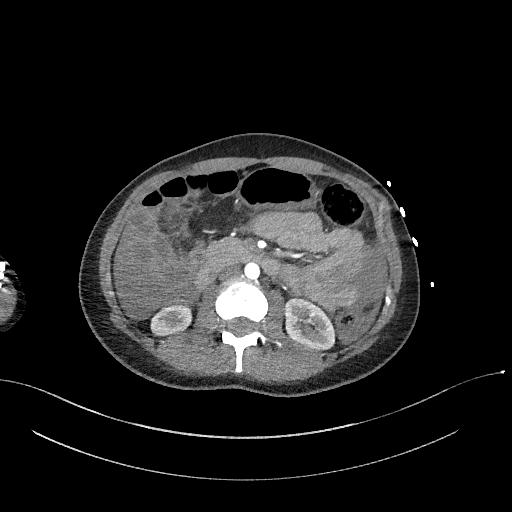
[im 168/320  soft-tissue]
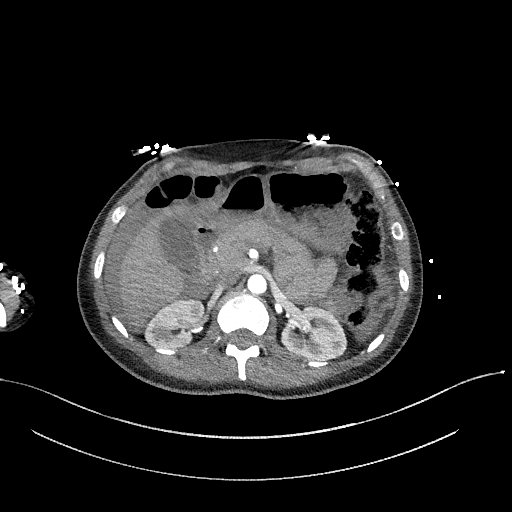
[im 202/320  soft-tissue]
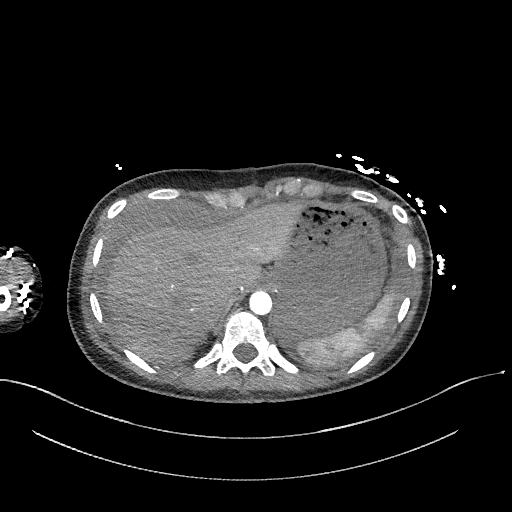
[im 236/320  soft-tissue]
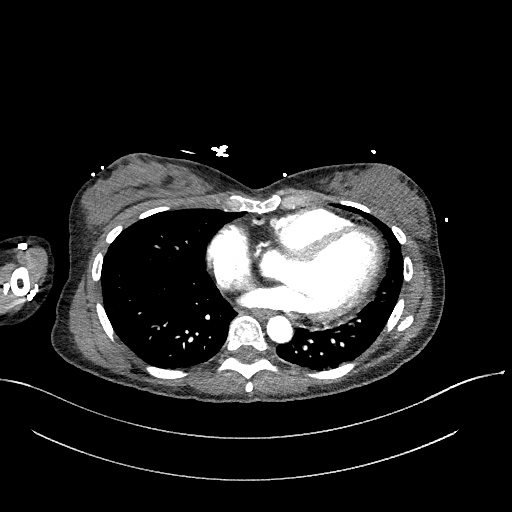
[im 269/320  soft-tissue]
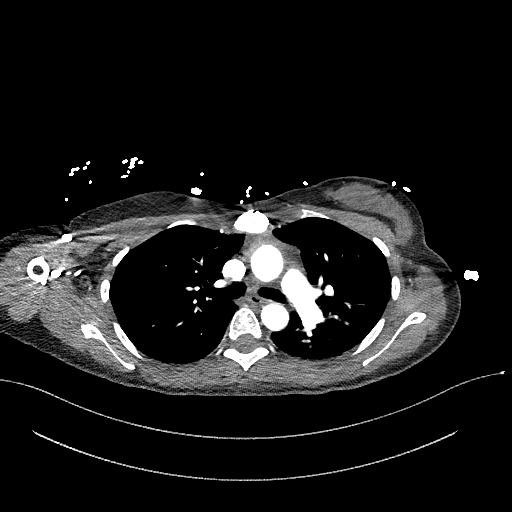
[im 269/320  bone]
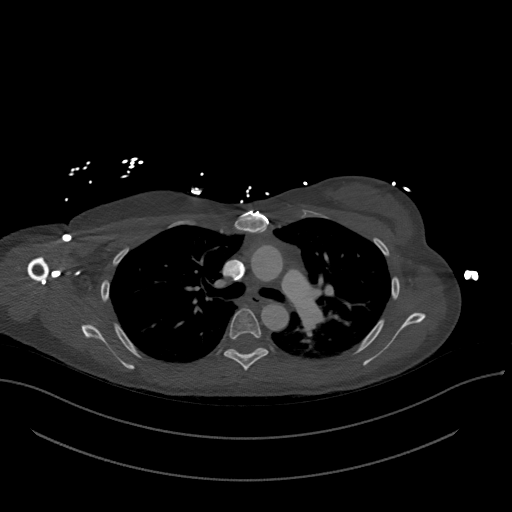
[im 303/320  soft-tissue]
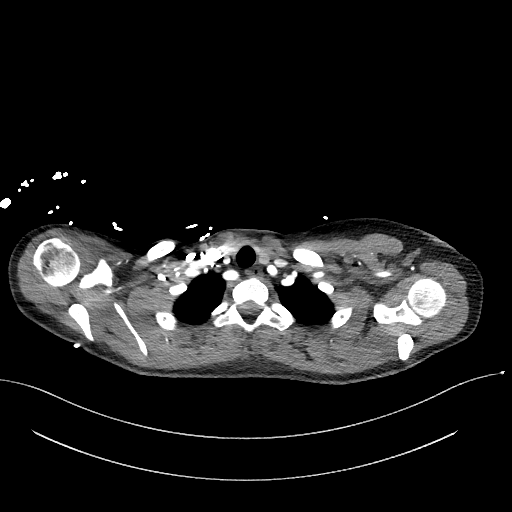

[Series 10: dissection 2mm cor · coronal · 0.74mm/px · 3 of 111 slices shown]
[im 28/111  soft-tissue]
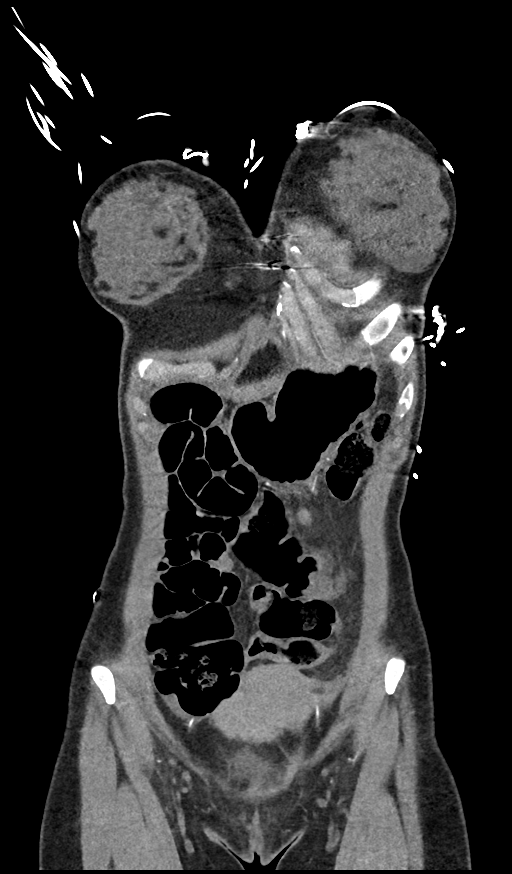
[im 56/111  soft-tissue]
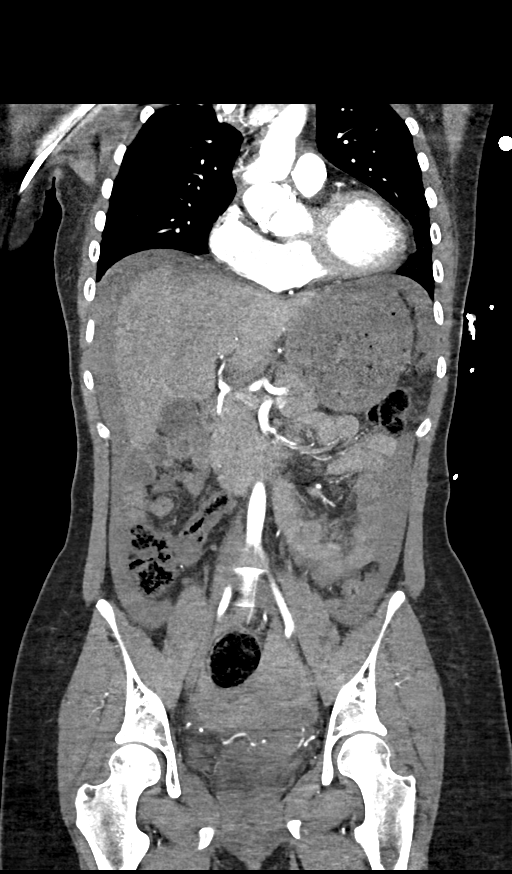
[im 83/111  soft-tissue]
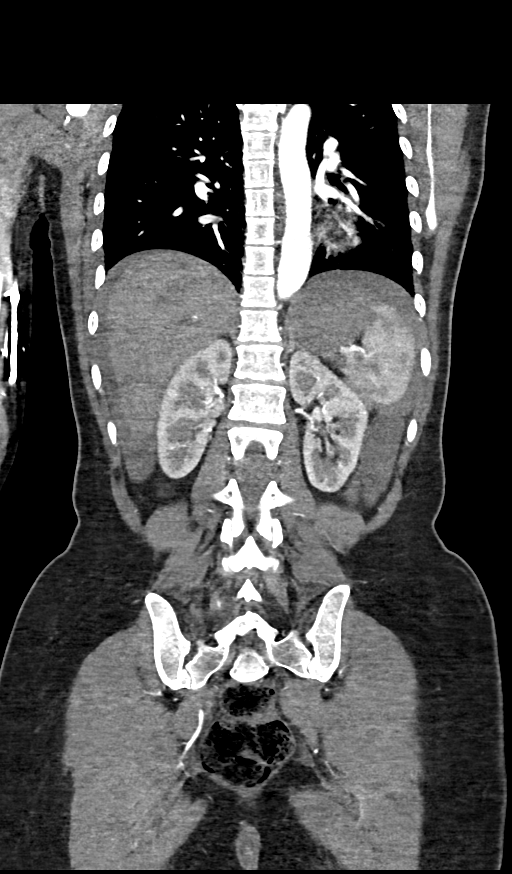

[13 of 46 positions shown; findings below may reference images not displayed]

FINDINGS: CTA CHEST FINDINGS

Cardiovascular: Postop changes of the chest including median
sternotomy for aortic valve repair as well as reconstruction of the
ascending thoracic aorta. Patient has a congenital left aortic arch
with an aberrant right subclavian artery coursing posterior to the
esophagus and trachea. Major branch vessels remain patent. No acute
mediastinal hemorrhage or hematoma. Pulmonary arteries are patent
without evidence of acute PE. Heart is enlarged. No pericardial
effusion. Central venous structures are also patent. No
ALLEN process.

Mediastinum/Nodes: No enlarged mediastinal, hilar, or axillary lymph
nodes. Thyroid gland, trachea, and esophagus demonstrate no
significant findings.

Lungs/Pleura: Lungs are clear. No pleural effusion or pneumothorax.

Musculoskeletal: No chest wall abnormality. No acute or significant
osseous findings.

Review of the MIP images confirms the above findings.

CTA ABDOMEN AND PELVIS FINDINGS

VASCULAR

Aorta: Intact abdominal aorta. Negative for aneurysm or dissection.
No retroperitoneal periaortic hemorrhage.

Celiac: Widely patent including its branches

SMA: Widely patent including its branches

Renals: Widely patent including an accessory renal artery on the
right.

IMA: Widely patent off the distal aorta including its branches

Inflow: Pelvic iliac vessels are widely patent. No inflow disease or
occlusion.

Veins: Dedicated venous phase imaging not performed.

Review of the MIP images confirms the above findings.

NON-VASCULAR

Hepatobiliary: No focal hepatic abnormality. Limited arterial phase
imaging only. No biliary obstruction pattern. Gallbladder
unremarkable. Common bile duct nondistended.

Pancreas: Unremarkable. No pancreatic ductal dilatation or
surrounding inflammatory changes.

Spleen: Normal in size without focal abnormality.

Adrenals/Urinary Tract: Adrenal glands are unremarkable. Kidneys are
normal, without renal calculi, focal lesion, or hydronephrosis.
Bladder is unremarkable.

Stomach/Bowel: Limited without oral contrast. Negative for bowel
obstruction, significant dilatation, ileus pattern, or free air.
Normal appendix in the right lower quadrant.

Lymphatic: No adenopathy.

Reproductive: Diffuse mixed attenuation moderate pelvic adnexal
acute hemorrhage surrounding the uterus and ovaries. Hemoperitoneum
extends along the pericolic gutters superiorly and around the liver
and spleen.

In the right adnexa, there is an ill-defined low-density area or
lesion measuring 3.4 cm, image 261/6. Along the superior margin of
this, there is a focus of hyperattenuation on image 257/6 suspicious
for an area of mild active bleeding. Constellation of findings
suggest ruptured hemorrhagic ovarian cyst with moderate pelvic acute
hematoma and hemoperitoneum.

Other: Intact abdominal wall.  Negative for hernia.

Musculoskeletal: No acute osseous finding.

Review of the MIP images confirms the above findings.
IMPRESSION: Chest: Postop changes of the ascending thoracic aorta and aortic
valve replacement. Left aortic arch with an aberrant right
subclavian artery, normal variant.

No acute intrathoracic vascular or non vascular finding.

Abdomen and pelvis: Moderate acute pelvic hematoma surrounding the
uterus and adnexa with associated moderate hemoperitoneum extending
superiorly throughout the abdomen. Suspect ruptured
hemorrhagic/bleeding right ovarian lesion/cyst. Focus of mild active
bleeding or contrast extravasation noted within the right hemipelvis
hematoma, as above.

These results were called by telephone at the time of interpretation
on [DATE] at [DATE] to provider ALLEN , who verbally
acknowledged these results.

## 2021-07-04 MED ORDER — SODIUM CHLORIDE 0.9 % IV SOLN
1000.0000 mL | INTRAVENOUS | Status: DC
  Administered 2021-07-04 – 2021-07-05 (×4): 1000 mL via INTRAVENOUS

## 2021-07-04 MED ORDER — ONDANSETRON HCL 4 MG/2ML IJ SOLN
4.0000 mg | Freq: Once | INTRAMUSCULAR | Status: AC
  Administered 2021-07-04: 4 mg via INTRAVENOUS
  Filled 2021-07-04: qty 2

## 2021-07-04 MED ORDER — IOHEXOL 350 MG/ML SOLN
100.0000 mL | Freq: Once | INTRAVENOUS | Status: AC | PRN
  Administered 2021-07-04: 100 mL via INTRAVENOUS

## 2021-07-04 MED ORDER — LIDOCAINE 5 % EX PTCH
1.0000 | MEDICATED_PATCH | CUTANEOUS | Status: DC
  Administered 2021-07-04 – 2021-07-06 (×3): 1 via TRANSDERMAL
  Filled 2021-07-04 (×4): qty 1

## 2021-07-04 MED ORDER — POLYETHYLENE GLYCOL 3350 17 G PO PACK
17.0000 g | PACK | Freq: Every day | ORAL | Status: DC | PRN
  Administered 2021-07-07: 17 g via ORAL
  Filled 2021-07-04: qty 1

## 2021-07-04 MED ORDER — ACETAMINOPHEN 325 MG PO TABS
650.0000 mg | ORAL_TABLET | Freq: Four times a day (QID) | ORAL | Status: DC | PRN
  Administered 2021-07-04 – 2021-07-07 (×4): 650 mg via ORAL
  Filled 2021-07-04 (×4): qty 2

## 2021-07-04 MED ORDER — OXYCODONE HCL 5 MG PO TABS
5.0000 mg | ORAL_TABLET | Freq: Four times a day (QID) | ORAL | Status: DC | PRN
  Administered 2021-07-04 – 2021-07-07 (×6): 5 mg via ORAL
  Filled 2021-07-04 (×6): qty 1

## 2021-07-04 MED ORDER — FENTANYL CITRATE PF 50 MCG/ML IJ SOSY
75.0000 ug | PREFILLED_SYRINGE | Freq: Once | INTRAMUSCULAR | Status: AC
  Administered 2021-07-04: 75 ug via INTRAVENOUS
  Filled 2021-07-04: qty 2

## 2021-07-04 MED ORDER — SODIUM CHLORIDE 0.9 % IV SOLN
10.0000 mL/h | Freq: Once | INTRAVENOUS | Status: AC
  Administered 2021-07-04: 10 mL/h via INTRAVENOUS

## 2021-07-04 MED ORDER — PROTHROMBIN COMPLEX CONC HUMAN 500 UNITS IV KIT
1682.0000 [IU] | PACK | Status: AC
  Administered 2021-07-04: 1682 [IU] via INTRAVENOUS
  Filled 2021-07-04: qty 682

## 2021-07-04 MED ORDER — DOCUSATE SODIUM 100 MG PO CAPS: 100.0000 mg | ORAL_CAPSULE | Freq: Two times a day (BID) | ORAL | Status: DC | PRN

## 2021-07-04 MED ORDER — SODIUM CHLORIDE 0.9 % IV BOLUS (SEPSIS)
1000.0000 mL | Freq: Once | INTRAVENOUS | Status: AC
  Administered 2021-07-04: 1000 mL via INTRAVENOUS

## 2021-07-04 MED ORDER — ONDANSETRON HCL 4 MG/2ML IJ SOLN
4.0000 mg | Freq: Four times a day (QID) | INTRAMUSCULAR | Status: DC | PRN
  Administered 2021-07-04: 4 mg via INTRAVENOUS
  Filled 2021-07-04: qty 2

## 2021-07-04 MED ORDER — VITAMIN K1 10 MG/ML IJ SOLN
10.0000 mg | INTRAVENOUS | Status: AC
  Administered 2021-07-04: 10 mg via INTRAVENOUS
  Filled 2021-07-04: qty 1

## 2021-07-04 MED ORDER — FENTANYL CITRATE PF 50 MCG/ML IJ SOSY
50.0000 ug | PREFILLED_SYRINGE | Freq: Once | INTRAMUSCULAR | Status: AC
  Administered 2021-07-04: 50 ug via INTRAVENOUS
  Filled 2021-07-04: qty 1

## 2021-07-04 NOTE — ED Triage Notes (Signed)
Per ems, pt from home. Pt reports lower abd pain with n/v/d that started last night and pt today woke up very diaphoretic and pale, near syncopal. Pt also reporting neck and shoulder pain. Hx of heart valve replacement x1 year ago. 12 lead unremarkable. EMS BP 96 systolic laying, sitting up 80 systolic. 20g L AC by ems, given NS and 4mg  Zofran.

## 2021-07-04 NOTE — ED Notes (Signed)
Critical care and OB at bedside.

## 2021-07-04 NOTE — Consult Note (Signed)
Assessment/recommendation: 25 yo P0, history bicuspid aortic valve s/p replacement with mechanical valve, on warfarin with goal inr 2-3, presenting with one day abdominal pain and presyncope. Found to have acute anemia, with CT findings suggestive of ruptured ovarian cyst with moderate hemoperitoneum. Borderline hypotensive but appears hemodynamically stable. Hgb 9.8 from 14 4 months ago; 1 u PRBC ordered - agree with ICU admission for close monitoring - agree with urgent INR reversal with vitamin k and PCC; hold warfarin - continue fluid resuscitation - serial hemoglobin and INR check, transfuse as needed - consider pelvic ultrasound if concern for ongoing bleeding - maintain NPO for now - surgical intervention if becomes hemodynamically stable or if significant ongoing blood loss  OB/GYN addendum Pt seen after Dr. Lelon Mast consultation as he is part of [facility].  Agree with his evaluation.  I have spoken to the critical care nurse practitioner who evaluated the patient.  Current plan is anticoagulation reversal, transfusion of 2 units PRBC, serial H/H and heparin challenge in the AM.  If there is suspicion of continued bleeding, precipitous drop in H/H or patient decompensation, would recommend laparoscopic  evaluation, evacuation of hemoperitoneum and cauterization of offending vessel.  Will follow along with Critical Care team and reevaluate in AM.  Mariel Aloe, MD   Reason for consult: Patient is a 25 y.o. P60 female who presented with diffuse abdominal pain as well as right shoulder pain, found to have acute blood loss anemia with hemoperitoneum, likely from ruptured ovarian cyst. This in the setting of INR 3.2, is on warfarin for mechanical aortic valve. Symptoms began yesterday.   Past Medical History:  Diagnosis Date   Anxiety    Bicuspid aortic valve    CHF (congestive heart failure) (HCC)    Congenital aortic insufficiency    H/O aortic valve replacement     Heart murmur    Migraines    Transposition of great vessels     Past Surgical History:  Procedure Laterality Date   BENTALL PROCEDURE  05/17/2020   S/P Aortic Aneurysm repair , [place] thoracotomy at [facility] Dr Kizzie Bane   CYST EXCISION     pilonodal   Tenny Craw Procedure      No family history on file.  Social History   Socioeconomic History   Marital status: Significant Other    Spouse name: Not on file   Number of children: 0   Years of education: 16   Highest education level: Not on file  Occupational History   Not on file  Tobacco Use   Smoking status: Never   Smokeless tobacco: Never  Vaping Use   Vaping Use: Never used  Substance and Sexual Activity   Alcohol use: No   Drug use: No   Sexual activity: Not on file  Other Topics Concern   Not on file  Social History Narrative   Not on file   Social Determinants of Health   Financial Resource Strain: Not on file  Food Insecurity: Not on file  Transportation Needs: Not on file  Physical Activity: Not on file  Stress: Not on file  Social Connections: Not on file  Intimate Partner Violence: Not on file     fentaNYL (SUBLIMAZE) injection  75 mcg Intravenous Once   ondansetron  4 mg Intravenous Once    Allergies  Allergen Reactions   Ceftriaxone Rash    Lower extremity edema   Ciprofloxacin Other (See Comments)    "symptoms more elevated so tries to avoid"  Lisinopril Cough    Review of Systems Endorses nausea and lightheadedness. Endorses abdominal pain that is band-like across mid abdomen as well as non-specific right shoulder pain. Denies chest pain or SOB or palpitations. No fevers.  Exam Vitals:   07/04/21 1500 07/04/21 1519  BP: 94/62 (!) 86/60  Pulse: 80 88  Resp: (!) 26 17  Temp:  98.6 F (37 C)  SpO2: 98% 98%   @SPO2 @  Physical Examination: General appearance - alert, well appearing, and in no distress Chest - clear to auscultation, no wheezes, rales or rhonchi, symmetric air entry Heart -  rrr, moderate holosystolic murmur Abdomen - soft, non-distended, mild diffuse ttp Skin - pale  Labs:  CBC    Component Value Date/Time   WBC 7.0 07/04/2021 1135   RBC 3.13 (L) 07/04/2021 1135   HGB 9.8 (L) 07/04/2021 1135   HGB 14.0 02/20/2021 0835   HCT 30.4 (L) 07/04/2021 1135   HCT 40.8 02/20/2021 0835   PLT 168 07/04/2021 1135   PLT 222 02/20/2021 0835   MCV 97.1 07/04/2021 1135   MCV 93 02/20/2021 0835   MCH 31.3 07/04/2021 1135   MCHC 32.2 07/04/2021 1135   RDW 12.7 07/04/2021 1135   RDW 12.4 02/20/2021 0835    CMP     Component Value Date/Time   NA 137 07/04/2021 1135   K 3.8 07/04/2021 1135   CL 108 07/04/2021 1135   CO2 26 07/04/2021 1135   GLUCOSE 133 (H) 07/04/2021 1135   BUN 8 07/04/2021 1135   CREATININE 0.78 07/04/2021 1135   CALCIUM 7.6 (L) 07/04/2021 1135   PROT 4.8 (L) 07/04/2021 1135   ALBUMIN 3.4 (L) 07/04/2021 1135   AST 16 07/04/2021 1135   ALT 10 07/04/2021 1135   ALKPHOS 33 (L) 07/04/2021 1135   BILITOT 0.8 07/04/2021 1135   GFRNONAA >60 07/04/2021 1135    Lab Results  Component Value Date   TSH 1.220 11/15/2020    No components found for: URINALYSIS  Lab Results  Component Value Date   PREGTESTUR NEGATIVE 06/07/2021    Radiological Studies CT Angio Chest/Abd/Pel for Dissection W and/or Wo Contrast  Result Date: 07/04/2021 CLINICAL DATA:  Acute right chest and abdominal pain, hypotension, tachycardia, near syncopal episode EXAM: CT ANGIOGRAPHY CHEST, ABDOMEN AND PELVIS TECHNIQUE: Multidetector CT imaging through the chest, abdomen and pelvis was performed using the standard protocol during bolus administration of intravenous contrast. Multiplanar reconstructed images and MIPs were obtained and reviewed to evaluate the vascular anatomy. CONTRAST:  07/06/2021 OMNIPAQUE IOHEXOL 350 MG/ML SOLN COMPARISON:  None. FINDINGS: CTA CHEST FINDINGS Cardiovascular: Postop changes of the chest including median sternotomy for aortic valve repair as well  as reconstruction of the ascending thoracic aorta. 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 Major branch vessels remain patent. No acute mediastinal hemorrhage or hematoma. Pulmonary arteries are patent without evidence of acute PE. Heart is enlarged. No pericardial effusion. Central venous structures are also patent. No veno-occlusive process. Mediastinum/Nodes: No enlarged mediastinal, hilar, or axillary lymph nodes. Thyroid gland, trachea, and esophagus demonstrate no significant findings. Lungs/Pleura: Lungs are clear. No pleural effusion or pneumothorax. Musculoskeletal: No chest wall abnormality. No acute or significant osseous findings. Review of the MIP images confirms the above findings. CTA ABDOMEN AND PELVIS FINDINGS VASCULAR Aorta: Intact abdominal aorta. Negative for aneurysm or dissection. No retroperitoneal periaortic hemorrhage. Celiac: Widely patent including its branches SMA: Widely patent including its branches  Renals: Widely patent including an accessory renal artery on the right. IMA: Widely patent off the distal aorta including its branches Inflow: Pelvic iliac vessels are widely patent. No inflow disease or occlusion. Veins: Dedicated venous phase imaging not performed. Review of the MIP images confirms the above findings. NON-VASCULAR Hepatobiliary: No focal hepatic abnormality. Limited arterial phase imaging only. No biliary obstruction pattern. Gallbladder unremarkable. Common bile duct nondistended. Pancreas: Unremarkable. No pancreatic ductal dilatation or surrounding inflammatory changes. Spleen: Normal in size without focal abnormality. Adrenals/Urinary Tract: Adrenal glands are unremarkable. Kidneys are normal, without renal calculi, focal lesion, or hydronephrosis. Bladder is unremarkable. Stomach/Bowel: Limited without oral contrast. Negative for bowel obstruction, significant dilatation, ileus  pattern, or free air. Normal appendix in the right lower quadrant. Lymphatic: No adenopathy. Reproductive: Diffuse mixed attenuation moderate pelvic adnexal acute hemorrhage surrounding the uterus and ovaries. Hemoperitoneum extends along the pericolic gutters superiorly and around the liver and spleen. In the right adnexa, there is an ill-defined low-density area or lesion measuring 3.4 cm, image 261/6. Along the superior margin of this, there is a focus of hyperattenuation on image 257/6 suspicious for an area of mild active bleeding. Constellation of findings suggest ruptured hemorrhagic ovarian cyst with moderate pelvic acute hematoma and hemoperitoneum. Other: Intact abdominal wall.  Negative for hernia. Musculoskeletal: No acute osseous finding. Review of the MIP images confirms the above findings. IMPRESSION: Chest: Postop changes of the ascending thoracic aorta and aortic valve replacement. Left aortic arch with an aberrant right subclavian artery, normal variant. No acute intrathoracic vascular or non vascular finding. Abdomen and pelvis: Moderate acute pelvic hematoma surrounding the uterus and adnexa with associated moderate hemoperitoneum extending superiorly throughout the abdomen. Suspect ruptured hemorrhagic/bleeding right ovarian lesion/cyst. Focus of mild active bleeding or contrast extravasation noted within the right hemipelvis hematoma, as above. These results were called by telephone at the time of interpretation on 07/04/2021 at 2:13 pm to provider [facility] , who verbally acknowledged these results. Electronically Signed   By: Judie Petit.  Shick M.D.   On: 07/04/2021 14:15    Thank you so much for allowing [place] to participate in the care of this patient.  We will continue to follow with you. Please call the attending OB/GYN physician with questions or concerns at [phone] M-F 8a-5p, after hours and on weekends, we can be reached at [phone].

## 2021-07-04 NOTE — ED Notes (Signed)
Second type and screen sent to blood bank. Blood consent paper form signed and completed at this time and placed in medical records.

## 2021-07-04 NOTE — ED Notes (Signed)
ED Provider at bedside. 

## 2021-07-04 NOTE — ED Notes (Signed)
Pt placed on bed pan. Expressed to pt to use the call light when finished.

## 2021-07-04 NOTE — ED Notes (Signed)
Pt transported to CT ?

## 2021-07-04 NOTE — Progress Notes (Signed)
eLink Physician-Brief Progress Note Patient Name: Gloria Hamilton DOB: Feb 19, 1996 MRN: [id number]   Date of Service  07/04/2021  HPI/Events of Note  Patient c/o R shoulder pain. Already on Oxycodone IR 5 mg PO Q 6 hours PRN pain.  eICU Interventions  Plan: Lidocaine patch 5 mg to R shoulder now and Q day.      Intervention Category Major Interventions: Other:  Lenell Antu 07/04/2021, 9:03 PM

## 2021-07-04 NOTE — Consult Note (Addendum)
Cardiology Consultation:  Patient ID: Gloria Hamilton MRN: [id number]; DOB: 07-01-1996  Admit date: 07/04/2021 Date of Consult: 07/04/2021  Primary Care Provider: Clementeen Graham, PA-C Primary Cardiologist: Meriam Sprague, MD  Primary Electrophysiologist:  None   Patient Profile:  Gloria Hamilton is a 25 y.o. female with a hx of congenital bicuspid aortic valve (s/p Ross procedure with failed AVR, re-do AVR with 27/29 On-X mechanical AVR 06/06/2020) who is being seen today for the evaluation of hemorrhage at the request of Levon Hedger, MD.  History of Present Illness:  Gloria Hamilton presents with abdominal pain over the last 24 hours.  She then presented to the emergency room and was found to have hemoglobin drop from 14.0-9.8.  She reports she was dizzy and lightheaded as well.  She is had no chest pain or trouble breathing.  This seems to all be abdominal pain.  Associated symptoms include nausea and vomiting.  Given no relief in symptoms she presented to the emergency room.  On arrival she was noted to have systolic blood pressures between 80 to 90 mmHg.  Laboratory data notable for hemoglobin dropped to 9.8.  Creatinine 0.78.  Lactic acid 2.0.  She is on Coumadin for mechanical valve.  INR 3.2.  Per review of records her goal INR is between 1.5-2.5.  She has an On-X mechanical valve which can have a lower INR goal.  She has never had a stroke.  There is no hypercoagulable state.  No atrial fibrillation is documented.  She really is low risk from a mechanical valve perspective.  CT abdomen pelvis demonstrates a moderate acute pelvic hematoma.  There is also hemoperitoneum reported throughout the abdomen.  This appears to be related to a ruptured right ovarian cyst. Tele shows HR 90s. EKG with SR.   Heart Pathway Score:       Past Medical History: Past Medical History:  Diagnosis Date   Anxiety    Bicuspid aortic valve    CHF (congestive heart failure) (HCC)    Congenital aortic  insufficiency    H/O aortic valve replacement    Heart murmur    Migraines    Transposition of great vessels     Past Surgical History: Past Surgical History:  Procedure Laterality Date   BENTALL PROCEDURE  05/17/2020   S/P Aortic Aneurysm repair , Redo thoracotomy at [facility] Dr Kizzie Bane   CYST EXCISION     pilonodal   Tenny Craw Procedure       Home Medications:  Prior to Admission medications   Medication Sig Start Date End Date Taking? Authorizing Provider  acetaminophen (TYLENOL) 325 MG tablet 1 tablet as needed   Yes [provider]  aspirin 81 MG chewable tablet Chew 81 mg by mouth daily.   Yes [provider]  metoprolol succinate (TOPROL XL) 25 MG 24 hr tablet Take 0.5 tablets (12.5 mg total) by mouth every evening. 11/15/20  Yes Meriam Sprague, MD  norethindrone (CAMILA) 0.35 MG tablet Take 1 tablet (0.35 mg total) by mouth daily. 07/16/18  Yes Rasch, Victorino Dike I, NP  warfarin (COUMADIN) 1 MG tablet Take 2 tablets (2mg ) by mouth daily along with your warfarin 7.5mg  tablet  as instructed by the Anticoagulation Clinic. Patient taking differently: Take 2 mg by mouth as directed. Take 2 tablets (2mg ) by mouth daily along with Warfarin 7.5mg  tablet on (Sundays, Mondays, Wednesdays, Thursdays, Fridays) as instructed by the Anticoagulation Clinic. 05/01/21  Yes 03-02-1990, MD  warfarin (COUMADIN) 7.5 MG tablet Take 1  tablet (7.5mg ) by mouth daily as instructed by the coumadin clinic. Patient taking differently: Take 7.5 mg by mouth as directed. Take 1 tablet (7.5 mg) by mouth daily along with 2 tablets (2 mg) on (Sundays, Mondays, Wednesdays, Thursdays, Fridays) & Take 1 tablet (7.5 mg) by mouth daily on (Tues & Saturday) as instructed by the Anticoagulation Clinic. 05/09/21  Yes Meriam Sprague, MD  nitrofurantoin, macrocrystal-monohydrate, (MACROBID) 100 MG capsule Take 1 capsule (100 mg total) by mouth 2 (two) times daily. 06/07/21   Rhys Martini, PA-C     Inpatient Medications: Scheduled Meds:  ondansetron  4 mg Intravenous Once   Continuous Infusions:  sodium chloride 1,000 mL (07/04/21 1408)   sodium chloride     phytonadione (VITAMIN K) IV     prothrombin complex conc human (Kcentra) IVPB     PRN Meds:   Allergies:    Allergies  Allergen Reactions   Ceftriaxone Rash    Lower extremity edema   Ciprofloxacin Other (See Comments)    "symptoms more elevated so tries to avoid"    Lisinopril Cough    Social History:   Social History   Socioeconomic History   Marital status: Significant Other    Spouse name: Not on file   Number of children: 0   Years of education: 16   Highest education level: Not on file  Occupational History   Not on file  Tobacco Use   Smoking status: Never   Smokeless tobacco: Never  Vaping Use   Vaping Use: Never used  Substance and Sexual Activity   Alcohol use: No   Drug use: No   Sexual activity: Not on file  Other Topics Concern   Not on file  Social History Narrative   Not on file   Social Determinants of Health   Financial Resource Strain: Not on file  Food Insecurity: Not on file  Transportation Needs: Not on file  Physical Activity: Not on file  Stress: Not on file  Social Connections: Not on file  Intimate Partner Violence: Not on file     Family History:   No family history on file.   ROS:  All other ROS reviewed and negative. Pertinent positives noted in the HPI.     Physical Exam/Data:   Vitals:   07/04/21 1400 07/04/21 1411 07/04/21 1415 07/04/21 1430  BP: 91/61 (!) 79/61 (!) 88/56 (!) 81/55  Pulse: 82 84 87 95  Resp: 15 19 (!) 24 18  Temp:  98.3 F (36.8 C)    TempSrc:  Oral    SpO2: 100% 100% 98% 93%  Weight:      Height:        Intake/Output Summary (Last 24 hours) at 07/04/2021 1455 Last data filed at 07/04/2021 1345 Gross per 24 hour  Intake 1000 ml  Output --  Net 1000 ml    Last 3 Weights 07/04/2021 03/01/2021 02/20/2021  Weight (lbs) 130 lb  131 lb 9.8 oz 133 lb 6.4 oz  Weight (kg) 58.968 kg 59.7 kg 60.51 kg    Body mass index is 23.03 kg/m.   General: Well nourished, well developed, in no acute distress Head: Atraumatic, normal size  Eyes: PEERLA, EOMI  Neck: Supple, no JVD Endocrine: No thryomegaly Cardiac: Normal S1 RRR; no murmurs, mechanical S2 click Lungs: Clear to auscultation bilaterally, no wheezing, rhonchi or rales  Abd: Soft, nontender, no hepatomegaly  Ext: No edema, pulses 2+ Musculoskeletal: No deformities, BUE and BLE strength normal and equal Skin:  Warm and dry, no rashes   Neuro: Alert and oriented to person, place, time, and situation, CNII-XII grossly intact, no focal deficits  Psych: Normal mood and affect   EKG:  The EKG was personally reviewed and demonstrates: Sinus rhythm heart rate 79, no acute ischemic changes or evidence of infarction Telemetry:  Telemetry was personally reviewed and demonstrates: Sinus rhythm in the 90s  Relevant CV Studies: TTE 09/26/2020 INTERPRETATION  MILD LV SYSTOLIC DYSFUNCTION (See above)  NORMAL RIGHT VENTRICULAR SYSTOLIC FUNCTION  MILD VALVULAR REGURGITATION (See above)  NO VALVULAR STENOSIS  AORTIC MECHANICAL PROSTHESIS APPEARS TO BE WELL-SEATED WITH ADEQUATE FUNCTION  S/P PULMONARY VALVE HOMOGRAFT (2009)   Laboratory Data: High Sensitivity Troponin:  No results for input(s): TROPONINIHS in the last 720 hours.   Cardiac EnzymesNo results for input(s): TROPONINI in the last 168 hours. No results for input(s): TROPIPOC in the last 168 hours.  Chemistry Recent Labs  Lab 07/04/21 1135  NA 137  K 3.8  CL 108  CO2 26  GLUCOSE 133*  BUN 8  CREATININE 0.78  CALCIUM 7.6*  GFRNONAA >60  ANIONGAP 3*    Recent Labs  Lab 07/04/21 1135  PROT 4.8*  ALBUMIN 3.4*  AST 16  ALT 10  ALKPHOS 33*  BILITOT 0.8   Hematology Recent Labs  Lab 07/04/21 1135  WBC 7.0  RBC 3.13*  HGB 9.8*  HCT 30.4*  MCV 97.1  MCH 31.3  MCHC 32.2  RDW 12.7  PLT 168    BNPNo results for input(s): BNP, PROBNP in the last 168 hours.  DDimer No results for input(s): DDIMER in the last 168 hours.  Radiology/Studies:  CT Angio Chest/Abd/Pel for Dissection W and/or Wo Contrast  Result Date: 07/04/2021 CLINICAL DATA:  Acute right chest and abdominal pain, hypotension, tachycardia, near syncopal episode EXAM: CT ANGIOGRAPHY CHEST, ABDOMEN AND PELVIS TECHNIQUE: Multidetector CT imaging through the chest, abdomen and pelvis was performed using the standard protocol during bolus administration of intravenous contrast. Multiplanar reconstructed images and MIPs were obtained and reviewed to evaluate the vascular anatomy. CONTRAST:  OMNIPAQUE IOHEXOL 350 MG/ML SOLN COMPARISON:  None. FINDINGS: CTA CHEST FINDINGS Cardiovascular: Postop changes of the chest including median sternotomy for aortic valve repair as well as reconstruction of the ascending thoracic aorta. 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 Major branch vessels remain patent. No acute mediastinal hemorrhage or hematoma. Pulmonary arteries are patent without evidence of acute PE. Heart is enlarged. No pericardial effusion. Central venous structures are also patent. No veno-occlusive process. Mediastinum/Nodes: No enlarged mediastinal, hilar, or axillary lymph nodes. Thyroid gland, trachea, and esophagus demonstrate no significant findings. Lungs/Pleura: Lungs are clear. No pleural effusion or pneumothorax. Musculoskeletal: No chest wall abnormality. No acute or significant osseous findings. Review of the MIP images confirms the above findings. CTA ABDOMEN AND PELVIS FINDINGS VASCULAR Aorta: Intact abdominal aorta. Negative for aneurysm or dissection. No retroperitoneal periaortic hemorrhage. Celiac: Widely patent including its branches SMA: Widely patent including its branches Renals: Widely patent including an accessory renal  artery on the right. IMA: Widely patent off the distal aorta including its branches Inflow: Pelvic iliac vessels are widely patent. No inflow disease or occlusion. Veins: Dedicated venous phase imaging not performed. Review of the MIP images confirms the above findings. NON-VASCULAR Hepatobiliary: No focal hepatic abnormality. Limited arterial phase imaging only. No biliary obstruction pattern. Gallbladder unremarkable. Common bile duct nondistended. Pancreas: Unremarkable. No pancreatic ductal  dilatation or surrounding inflammatory changes. Spleen: Normal in size without focal abnormality. Adrenals/Urinary Tract: Adrenal glands are unremarkable. Kidneys are normal, without renal calculi, focal lesion, or hydronephrosis. Bladder is unremarkable. Stomach/Bowel: Limited without oral contrast. Negative for bowel obstruction, significant dilatation, ileus pattern, or free air. Normal appendix in the right lower quadrant. Lymphatic: No adenopathy. Reproductive: Diffuse mixed attenuation moderate pelvic adnexal acute hemorrhage surrounding the uterus and ovaries. Hemoperitoneum extends along the pericolic gutters superiorly and around the liver and spleen. In the right adnexa, there is an ill-defined low-density area or lesion measuring 3.4 cm, image 261/6. Along the superior margin of this, there is a focus of hyperattenuation on image 257/6 suspicious for an area of mild active bleeding. Constellation of findings suggest ruptured hemorrhagic ovarian cyst with moderate pelvic acute hematoma and hemoperitoneum. Other: Intact abdominal wall.  Negative for hernia. Musculoskeletal: No acute osseous finding. Review of the MIP images confirms the above findings. IMPRESSION: Chest: Postop changes of the ascending thoracic aorta and aortic valve replacement. Left aortic arch with an aberrant right subclavian artery, normal variant. No acute intrathoracic vascular or non vascular finding. Abdomen and pelvis: Moderate acute pelvic  hematoma surrounding the uterus and adnexa with associated moderate hemoperitoneum extending superiorly throughout the abdomen. Suspect ruptured hemorrhagic/bleeding right ovarian lesion/cyst. Focus of mild active bleeding or contrast extravasation noted within the right hemipelvis hematoma, as above. These results were called by telephone at the time of interpretation on 07/04/2021 at 2:13 pm to provider [facility] , who verbally acknowledged these results. Electronically Signed   By: Judie Petit.  Shick M.D.   On: 07/04/2021 14:15    Assessment and Plan:   #Ruptured ovarian cyst #Hemorrhagic shock #27/29 Mechanical Aortic Valve (INR goal 1.5-2.5 per [facility]) #Ross procedure with pulmonary homograft  #EF 40-45% on Echo/58% MRI -She underwent mechanical mitral valve replacement with a 27/29 On-X mechanical valve.  She has had a prior Ross procedure and has her pulmonary homograft in place. -She now presents with hemorrhagic shock secondary to a ruptured ovarian cyst.  Hemoglobin is dropping 14 down to 9.8. -INR is supratherapeutic at 3.2. -Given her instability and very rapid decline in hemoglobin value I do agree with [facility] as well as reversing her INR. -Would recommend to continue to hold her Coumadin as you are doing. -Regarding bridging there is mention that her EF was 40% at [facility] on echo.  She had an MRI recently shows an EF of 58%.  If she does have systolic dysfunction she will need bridging therapy per 2017 ACC/AHA Valve Guidelines. Given her profound bleed she would likely want to be rechallenged on heparin to see if she does rebleed.  Again if she undergoes surgery which if it is necessary is okay with cardiology she may have more definitive treatment of her cyst.   -We will continue to follow along to guide bridging and starting coumadin when needed.  -would hold metoprolol for now.  -Cardiology to follow.   For questions or updates, please contact [facility] Please consult [email or web address] for  contact info under   Signed, Gerri Spore T. Flora Lipps, MD, [facility]  [facility]  07/04/2021 2:55 PM

## 2021-07-04 NOTE — H&P (Signed)
NAME:  Gloria Hamilton, MRN:  [id number], DOB:  1996-07-03, LOS: 0 ADMISSION DATE:  07/04/2021, CONSULTATION DATE:  07/04/21 REFERRING MD:  Lynelle Doctor - EM, CHIEF COMPLAINT:  Abdominal pain : bleeding ovarian cyst    History of Present Illness:  25 yo F PMH congenital aortic insufficieny, aortic root aneurysm s/p repair and mechanical aortic valve replacement ([facility] -- Dr. Kizzie Bane, 2021) on coumadin, presented to ED 9/22 with abdominal pain, n/v/d which began 9/21 evening. OTC medications were not helpful, pain persisted and patient felt lightheaded prompting ED presentation. In ED, SBP 80s.  Hgb 9.8 from 14. CTA c/a/p revealed hemoperitoneum, acute hemorrhage surrounding uterus and ovaries.  In ED, emergency release blood ordered, Vit K given, Kcentra ordered. OB called, rec watchful waiting. Cards consulted for anticoag management. PCCM consulted for admission    Pertinent  Medical History  Congenital aortic insufficiency s/p mechanical replacement  Chronic anticoagulation  Migraines  Frequent UTI   Significant Hospital Events: Including procedures, antibiotic start and stop dates in addition to other pertinent events   9/22 ED for abdominal pain, found to have bleeding ovarian cyst. Getting blood Vit K Kcentra. OB Cards PCCM consulted   Interim History / Subjective:  Getting 1 PRBC  Got Vit K Kcentra ordered   Objective   Blood pressure (!) 93/57, pulse 87, temperature 98.3 F (36.8 C), temperature source Oral, resp. rate 20, height 5\' 3"  (1.6 m), weight 59 kg, last menstrual period 06/20/2021, SpO2 100 %.        Intake/Output Summary (Last 24 hours) at 07/04/2021 1510 Last data filed at 07/04/2021 1345 Gross per 24 hour  Intake 1000 ml  Output --  Net 1000 ml   Filed Weights   07/04/21 1124  Weight: 59 kg    Examination: General: WDWN young adult F reclined in bed NAD  HENT: NCAT pink mmm anicteric sclera  Lungs: CTAb symmetrical chest expansion, even and unlabored   Cardiovascular: + click, + murmur. Cap refill < 3 seconds. Rrr. Healed sternotomy  Abdomen: soft tender + bowel sounds  Extremities: no acute joint deformity no cyanosis or clubbing  Neuro: AAOx4 following commands  GU: defer  Skin: pale, c/d/w no rash   Resolved Hospital Problem list   Shock   Assessment & Plan:   ABLA Hemoperitoneum due to hemorrhagic ovarian cyst Improving hemorrhagic shock  -baseline SBP around  90 -- in ED was SBP 80s but now at baseline  P -1 PRBC  -post transfusion H/H  -admit to ICU for close hemodynamic monitoring -OB consulted -- no surgical intervention for now   S/p mechanical aortic valve replacement on chronic coumadin Hx congenital aortic insufficiency, aortic root aneurysm  P -Vit K and Kcentra in ED  -Cards consulting and following closely  -Anticipate likely heparin challenge in next day or so if stable   Elevated LA -in setting of bleed, hypotension P -repeat pending   Best Practice (right click and "Reselect all SmartList Selections" daily)   Diet/type: Regular consistency (see orders) DVT prophylaxis: SCD GI prophylaxis: N/A Lines: N/A Foley:  N/A Code Status:  full code Last date of multidisciplinary goals of care discussion [9/22 - OB, CCM, Cards ]  Labs   CBC: Recent Labs  Lab 07/04/21 1135  WBC 7.0  HGB 9.8*  HCT 30.4*  MCV 97.1  PLT 168    Basic Metabolic Panel: Recent Labs  Lab 07/04/21 1135  NA 137  K 3.8  CL 108  CO2 26  GLUCOSE 133*  BUN 8  CREATININE 0.78  CALCIUM 7.6*   GFR: Estimated Creatinine Clearance: 88.9 mL/min (by C-G formula based on SCr of 0.78 mg/dL). Recent Labs  Lab 07/04/21 1135 07/04/21 1143  WBC 7.0  --   LATICACIDVEN  --  2.0*    Liver Function Tests: Recent Labs  Lab 07/04/21 1135  AST 16  ALT 10  ALKPHOS 33*  BILITOT 0.8  PROT 4.8*  ALBUMIN 3.4*   Recent Labs  Lab 07/04/21 1135  LIPASE 32   No results for input(s): AMMONIA in the last 168  hours.  ABG No results found for: PHART, PCO2ART, PO2ART, HCO3, TCO2, ACIDBASEDEF, O2SAT   Coagulation Profile: Recent Labs  Lab 07/04/21 1143  INR 3.2*    Cardiac Enzymes: No results for input(s): CKTOTAL, CKMB, CKMBINDEX, TROPONINI in the last 168 hours.  HbA1C: No results found for: HGBA1C  CBG: No results for input(s): GLUCAP in the last 168 hours.  Review of Systems:   Review of Systems  Constitutional: Negative.   HENT: Negative.    Eyes: Negative.   Respiratory: Negative.    Cardiovascular: Negative.   Gastrointestinal:  Positive for abdominal pain, diarrhea, nausea and vomiting. Negative for blood in stool and constipation.  Genitourinary: Negative.   Musculoskeletal: Negative.   Skin: Negative.   Neurological:  Positive for dizziness, tingling and headaches. Negative for tremors, sensory change, speech change, focal weakness, seizures, loss of consciousness and weakness.  Endo/Heme/Allergies: Negative.   Psychiatric/Behavioral: Negative.      Past Medical History:  She,  has a past medical history of Anxiety, Bicuspid aortic valve, CHF (congestive heart failure) (HCC), Congenital aortic insufficiency, H/O aortic valve replacement, Heart murmur, Migraines, and Transposition of great vessels.   Surgical History:   Past Surgical History:  Procedure Laterality Date   BENTALL PROCEDURE  05/17/2020   S/P Aortic Aneurysm repair , Redo thoracotomy at [facility] Dr Kizzie Bane   CYST EXCISION     pilonodal   Tenny Craw Procedure       Social History:   reports that she has never smoked. She has never used smokeless tobacco. She reports that she does not drink alcohol and does not use drugs.   Family History:  Her family history is not on file.   Allergies Allergies  Allergen Reactions   Ceftriaxone Rash    Lower extremity edema   Ciprofloxacin Other (See Comments)    "symptoms more elevated so tries to avoid"    Lisinopril Cough     Home Medications  Prior to  Admission medications   Medication Sig Start Date End Date Taking? Authorizing Provider  acetaminophen (TYLENOL) 325 MG tablet 1 tablet as needed   Yes [provider]  aspirin 81 MG chewable tablet Chew 81 mg by mouth daily.   Yes [provider]  metoprolol succinate (TOPROL XL) 25 MG 24 hr tablet Take 0.5 tablets (12.5 mg total) by mouth every evening. 11/15/20  Yes Meriam Sprague, MD  norethindrone (CAMILA) 0.35 MG tablet Take 1 tablet (0.35 mg total) by mouth daily. 07/16/18  Yes Rasch, Victorino Dike I, NP  warfarin (COUMADIN) 1 MG tablet Take 2 tablets (2mg ) by mouth daily along with your warfarin 7.5mg  tablet  as instructed by the Anticoagulation Clinic. Patient taking differently: Take 2 mg by mouth as directed. Take 2 tablets (2mg ) by mouth daily along with Warfarin 7.5mg  tablet on (Sundays, Mondays, Wednesdays, Thursdays, Fridays) as instructed by the Anticoagulation Clinic. 05/01/21  Yes 03-02-1990, MD  warfarin (  COUMADIN) 7.5 MG tablet Take 1 tablet (7.5mg ) by mouth daily as instructed by the coumadin clinic. Patient taking differently: Take 7.5 mg by mouth as directed. Take 1 tablet (7.5 mg) by mouth daily along with 2 tablets (2 mg) on (Sundays, Mondays, Wednesdays, Thursdays, Fridays) & Take 1 tablet (7.5 mg) by mouth daily on (Tues & Saturday) as instructed by the Anticoagulation Clinic. 05/09/21  Yes Meriam Sprague, MD  nitrofurantoin, macrocrystal-monohydrate, (MACROBID) 100 MG capsule Take 1 capsule (100 mg total) by mouth 2 (two) times daily. 06/07/21   Rhys Martini, PA-C     Critical care time: 40 min    CRITICAL CARE Performed by: Lanier Clam   Total critical care time: 40 minutes  Critical care time was exclusive of separately billable procedures and treating other patients. Critical care was necessary to treat or prevent imminent or life-threatening deterioration.  Critical care was time spent personally by me on the following  activities: development of treatment plan with patient and/or surrogate as well as nursing, discussions with consultants, evaluation of patient's response to treatment, examination of patient, obtaining history from patient or surrogate, ordering and performing treatments and interventions, ordering and review of laboratory studies, ordering and review of radiographic studies, pulse oximetry and re-evaluation of patient's condition.   Tessie Fass MSN, AGACNP-BC [place] [facility] Amion for pager  07/04/2021, 3:31 PM

## 2021-07-04 NOTE — ED Notes (Signed)
ED Provider at bedside. Pt tolerating blood transfusion well, will continue to monitor.

## 2021-07-04 NOTE — ED Provider Notes (Addendum)
[facility] EMERGENCY DEPARTMENT Provider Note   CSN: [id number] Arrival date & time: 07/04/21  1115     History Chief Complaint  Patient presents with   Abdominal Pain   Near Syncope    Gloria Hamilton is a 25 y.o. female.   Abdominal Pain Near Syncope Associated symptoms include abdominal pain.   Patient has a history of congenital aortic insufficiency.  She has had subsequent aortic mechanical heart valve replacement.  Patient normally is on Coumadin.  Patient states she started having symptoms last night of pain in her abdomen associated with nausea vomiting and diarrhea.  She tried taking some medications for pain and thought it might go away by the morning.  The symptoms persisted and she started having pain into her neck and shoulder area.  Patient felt lightheaded and near syncopal.  EMS was called and they noticed her blood pressure was in the 80s and 90s systolic.  Patient was given IV fluids and Zofran.  She has not noticed any fevers.  No blood in her stool.  She has had some urinary discomfort.  Past Medical History:  Diagnosis Date   Anxiety    Bicuspid aortic valve    CHF (congestive heart failure) (HCC)    Congenital aortic insufficiency    H/O aortic valve replacement    Heart murmur    Migraines    Transposition of great vessels     Patient Active Problem List   Diagnosis Date Noted   Encounter for therapeutic drug monitoring 11/15/2020   Aortic valve replaced 11/15/2020    Past Surgical History:  Procedure Laterality Date   BENTALL PROCEDURE  05/17/2020   S/P Aortic Aneurysm repair , Redo thoracotomy at [facility] Dr Kizzie Bane   CYST EXCISION     pilonodal   Tenny Craw Procedure       OB History   No obstetric history on file.     No family history on file.  Social History   Tobacco Use   Smoking status: Never   Smokeless tobacco: Never  Vaping Use   Vaping Use: Never used  Substance Use Topics   Alcohol use: No   Drug use: No     Home Medications Prior to Admission medications   Medication Sig Start Date End Date Taking? Authorizing Provider  aspirin 81 MG chewable tablet Chew 81 mg by mouth daily.   Yes [provider]  metoprolol succinate (TOPROL XL) 25 MG 24 hr tablet Take 0.5 tablets (12.5 mg total) by mouth every evening. 11/15/20  Yes Meriam Sprague, MD  warfarin (COUMADIN) 1 MG tablet Take 2 tablets (2mg ) by mouth daily along with your warfarin 7.5mg  tablet  as instructed by the Anticoagulation Clinic. Patient taking differently: Take 2 mg by mouth as directed. Take 2 tablets (2mg ) by mouth daily along with Warfarin 7.5mg  tablet on (Sundays, Mondays, Wednesdays, Thursdays, Fridays) as instructed by the Anticoagulation Clinic. 05/01/21  Yes 03-02-1990, MD  warfarin (COUMADIN) 7.5 MG tablet Take 1 tablet (7.5mg ) by mouth daily as instructed by the coumadin clinic. Patient taking differently: Take 7.5 mg by mouth as directed. Take 1 tablet (7.5 mg) by mouth daily along with 2 tablets (2 mg) on (Sundays, Mondays, Wednesdays, Thursdays, Fridays) & Take 1 tablet (7.5 mg) by mouth daily on (Tues & Saturday) as instructed by the Anticoagulation Clinic. 05/09/21  Yes Friday, MD  acetaminophen (TYLENOL) 325 MG tablet 1 tablet as needed    [provider]  nitrofurantoin, macrocrystal-monohydrate, (MACROBID) 100 MG capsule Take 1 capsule (100 mg total) by mouth 2 (two) times daily. 06/07/21   Rhys Martini, PA-C  norethindrone (CAMILA) 0.35 MG tablet Take 1 tablet (0.35 mg total) by mouth daily. 07/16/18   Rasch, Victorino Dike I, NP    Allergies    Ceftriaxone, Ciprofloxacin, and Lisinopril  Review of Systems   Review of Systems  Cardiovascular:  Positive for near-syncope.  Gastrointestinal:  Positive for abdominal pain.  All other systems reviewed and are negative.  Physical Exam Updated Vital Signs BP (!) 81/55   Pulse 95   Temp 98.3 F (36.8 C) (Oral)   Resp 18   Ht  1.6 m ( )   Wt 59 kg   LMP 06/20/2021   SpO2 93%   BMI 23.03 kg/m   Physical Exam Vitals and nursing note reviewed.  Constitutional:      General: She is not in acute distress.    Appearance: She is well-developed.  HENT:     Head: Normocephalic and atraumatic.     Right Ear: External ear normal.     Left Ear: External ear normal.  Eyes:     General: No scleral icterus.       Right eye: No discharge.        Left eye: No discharge.     Conjunctiva/sclera: Conjunctivae normal.  Neck:     Trachea: No tracheal deviation.  Cardiovascular:     Rate and Rhythm: Normal rate and regular rhythm.     Comments: Mechanical heart valve Pulmonary:     Effort: Pulmonary effort is normal. No respiratory distress.     Breath sounds: Normal breath sounds. No stridor. No wheezing or rales.  Abdominal:     General: Bowel sounds are normal. There is no distension.     Palpations: Abdomen is soft.     Tenderness: There is generalized abdominal tenderness. There is no guarding or rebound.  Musculoskeletal:        General: No tenderness or deformity.     Cervical back: Neck supple.  Skin:    General: Skin is warm and dry.     Findings: No rash.  Neurological:     General: No focal deficit present.     Mental Status: She is alert.     Cranial Nerves: No cranial nerve deficit (no facial droop, extraocular movements intact, no slurred speech).     Sensory: No sensory deficit.     Motor: No abnormal muscle tone or seizure activity.     Coordination: Coordination normal.  Psychiatric:        Mood and Affect: Mood normal.    ED Results / Procedures / Treatments   Labs (all labs ordered are listed, but only abnormal results are displayed) Labs Reviewed  CBC - Abnormal; Notable for the following components:      Result Value   RBC 3.13 (*)    Hemoglobin 9.8 (*)    HCT 30.4 (*)    All other components within normal limits  COMPREHENSIVE METABOLIC PANEL - Abnormal; Notable for the  following components:   Glucose, Bld 133 (*)    Calcium 7.6 (*)    Total Protein 4.8 (*)    Albumin 3.4 (*)    Alkaline Phosphatase 33 (*)    Anion gap 3 (*)    All other components within normal limits  LACTIC ACID, PLASMA - Abnormal; Notable for the following components:   Lactic Acid, Venous 2.0 (*)  All other components within normal limits  PROTIME-INR - Abnormal; Notable for the following components:   Prothrombin Time 32.9 (*)    INR 3.2 (*)    All other components within normal limits  LIPASE, BLOOD  URINALYSIS, ROUTINE W REFLEX MICROSCOPIC  LACTIC ACID, PLASMA  CBG MONITORING, ED  I-STAT BETA HCG BLOOD, ED ([facility], [facility], [facility] ONLY)  POC OCCULT BLOOD, ED  TYPE AND SCREEN  PREPARE RBC (CROSSMATCH)  ABO/RH    EKG EKG Interpretation  Date/Time:  Thursday July 04 2021 11:26:14 EDT Ventricular Rate:  79 PR Interval:  130 QRS Duration: 87 QT Interval:  391 QTC Calculation: 449 R Axis:   83 Text Interpretation: Sinus rhythm Nonspecific T abnrm, anterolateral leads No old tracing to compare Confirmed by Linwood Dibbles [phone]) on 07/04/2021 12:03:00 PM  Radiology CT Angio Chest/Abd/Pel for Dissection W and/or Wo Contrast  Result Date: 07/04/2021 CLINICAL DATA:  Acute right chest and abdominal pain, hypotension, tachycardia, near syncopal episode EXAM: CT ANGIOGRAPHY CHEST, ABDOMEN AND PELVIS TECHNIQUE: Multidetector CT imaging through the chest, abdomen and pelvis was performed using the standard protocol during bolus administration of intravenous contrast. Multiplanar reconstructed images and MIPs were obtained and reviewed to evaluate the vascular anatomy. CONTRAST:  OMNIPAQUE IOHEXOL 350 MG/ML SOLN COMPARISON:  None. FINDINGS: CTA CHEST FINDINGS Cardiovascular: Postop changes of the chest including median sternotomy for aortic valve repair as well as reconstruction of the ascending thoracic aorta. 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 Major branch vessels remain patent. No acute mediastinal hemorrhage or hematoma. Pulmonary arteries are patent without evidence of acute PE. Heart is enlarged. No pericardial effusion. Central venous structures are also patent. No veno-occlusive process. Mediastinum/Nodes: No enlarged mediastinal, hilar, or axillary lymph nodes. Thyroid gland, trachea, and esophagus demonstrate no significant findings. Lungs/Pleura: Lungs are clear. No pleural effusion or pneumothorax. Musculoskeletal: No chest wall abnormality. No acute or significant osseous findings. Review of the MIP images confirms the above findings. CTA ABDOMEN AND PELVIS FINDINGS VASCULAR Aorta: Intact abdominal aorta. Negative for aneurysm or dissection. No retroperitoneal periaortic hemorrhage. Celiac: Widely patent including its branches SMA: Widely patent including its branches Renals: Widely patent including an accessory renal artery on the right. IMA: Widely patent off the distal aorta including its branches Inflow: Pelvic iliac vessels are widely patent. No inflow disease or occlusion. Veins: Dedicated venous phase imaging not performed. Review of the MIP images confirms the above findings. NON-VASCULAR Hepatobiliary: No focal hepatic abnormality. Limited arterial phase imaging only. No biliary obstruction pattern. Gallbladder unremarkable. Common bile duct nondistended. Pancreas: Unremarkable. No pancreatic ductal dilatation or surrounding inflammatory changes. Spleen: Normal in size without focal abnormality. Adrenals/Urinary Tract: Adrenal glands are unremarkable. Kidneys are normal, without renal calculi, focal lesion, or hydronephrosis. Bladder is unremarkable. Stomach/Bowel: Limited without oral contrast. Negative for bowel obstruction, significant dilatation, ileus pattern, or free air. Normal appendix in the right lower quadrant. Lymphatic: No adenopathy. Reproductive: Diffuse mixed  attenuation moderate pelvic adnexal acute hemorrhage surrounding the uterus and ovaries. Hemoperitoneum extends along the pericolic gutters superiorly and around the liver and spleen. In the right adnexa, there is an ill-defined low-density area or lesion measuring 3.4 cm, image 261/6. Along the superior margin of this, there is a focus of hyperattenuation on image 257/6 suspicious for an area of mild active bleeding. Constellation of findings suggest ruptured hemorrhagic ovarian cyst with moderate pelvic acute hematoma and hemoperitoneum. Other: Intact abdominal  wall.  Negative for hernia. Musculoskeletal: No acute osseous finding. Review of the MIP images confirms the above findings. IMPRESSION: Chest: Postop changes of the ascending thoracic aorta and aortic valve replacement. Left aortic arch with an aberrant right subclavian artery, normal variant. No acute intrathoracic vascular or non vascular finding. Abdomen and pelvis: Moderate acute pelvic hematoma surrounding the uterus and adnexa with associated moderate hemoperitoneum extending superiorly throughout the abdomen. Suspect ruptured hemorrhagic/bleeding right ovarian lesion/cyst. Focus of mild active bleeding or contrast extravasation noted within the right hemipelvis hematoma, as above. These results were called by telephone at the time of interpretation on 07/04/2021 at 2:13 pm to provider [facility] , who verbally acknowledged these results. Electronically Signed   By: Judie Petit.  Shick M.D.   On: 07/04/2021 14:15    Procedures .Critical Care Performed by: Linwood Dibbles, MD Authorized by: Linwood Dibbles, MD   Critical care provider statement:    Critical care time (minutes):  65   Critical care was time spent personally by me on the following activities:  Discussions with consultants, evaluation of patient's response to treatment, examination of patient, ordering and performing treatments and interventions, ordering and review of laboratory studies, ordering  and review of radiographic studies, pulse oximetry, re-evaluation of patient's condition, obtaining history from patient or surrogate and review of old charts   Medications Ordered in ED Medications  sodium chloride 0.9 % bolus 1,000 mL (0 mLs Intravenous Stopped 07/04/21 1345)    Followed by  0.9 %  sodium chloride infusion (1,000 mLs Intravenous New Bag/Given 07/04/21 1408)  0.9 %  sodium chloride infusion (has no administration in time range)  ondansetron (ZOFRAN) injection 4 mg (has no administration in time range)  prothrombin complex conc human (KCENTRA) IVPB 1,682 Units (has no administration in time range)  phytonadione (VITAMIN K) 10 mg in dextrose 5 % 50 mL IVPB (has no administration in time range)  fentaNYL (SUBLIMAZE) injection 50 mcg (50 mcg Intravenous Given 07/04/21 1301)  iohexol (OMNIPAQUE) 350 MG/ML injection 100 mL (100 mLs Intravenous Contrast Given 07/04/21 1328)    ED Course  I have reviewed the triage vital signs and the nursing notes.  Pertinent labs & imaging results that were available during my care of the patient were reviewed by me and considered in my medical decision making (see chart for details).  Clinical Course as of 07/04/21 1528  Thu Jul 04, 2021  1252 INR in the therapeutic range at 3.2.  Hemoglobin significantly decreased at 9.8 [JK]  1310 Blood transfusions ordered.  We will proceed with CT angiogram chest abdomen pelvis to evaluate further.  ?aortic dissection, ?spontaneous abd hemorrhage, retroperitoneal hemorrhage [JK]  1330 Discussed with blood bank regarding emergency release of 1 unit. Type specific blood is being crossmatched.  2 units of that ordered for transfusion [JK]  1334 Preliminary review of CT scan suggests retroperitoneal hemorrhage.  Will await radiology interpretation [JK]  1346 Discussed with cardiology regarding her anticoagulation and hemorrhage to get their assistance in management. [JK]  1405 D/w Ob GYN.  Would continue  supportive care.  Advoid surgery, serial monitoring  [JK]  1431 D/w Dr Bjorn Pippin.  Agrees with reversal for now.  Once stabilized, could start back on heparin. [JK]    Clinical Course User Index [JK] Linwood Dibbles, MD   MDM Rules/Calculators/A&P                           Patient presented to the ED  with complaints of abdominal pain weakness and hypotension.  Patient has history of mechanical aortic valve replacement on chronic Coumadin anticoagulation.  Patient was noted to be hypotensive on arrival.  IV fluids ordered.  Presentation concerning for the possibility of acute GI bleed, intraperitoneal bleed as well as aortic dissection with her complaints of back and shoulder pain.  Laboratory tests were notable for drop in her hemoglobin.  Hemoccult was negative no signs of GI bleeding.  CT angio of chest abdomen pelvis performed and shows evidence of intraperitoneal bleeding likely related to a hemorrhagic ovarian cyst.  Consulted with OB/GYN, cardiology, pharmacy and critical care.  I have ordered Kcentra for reversal considering her persistent hypotension and intraperitoneal bleeding.  We will need to monitor closely.  O- blood was ordered and patient also has type-specific blood ordered. Final Clinical Impression(s) / ED Diagnoses Final diagnoses:  Hemorrhagic ovarian cyst  Acute blood loss anemia  Anticoagulated on Coumadin  H/O heart valve replacement with mechanical valve       Linwood Dibbles, MD 07/04/21 1529

## 2021-07-04 NOTE — Plan of Care (Signed)

## 2021-07-05 DIAGNOSIS — R578 Other shock: Secondary | ICD-10-CM | POA: Diagnosis not present

## 2021-07-05 DIAGNOSIS — Z7901 Long term (current) use of anticoagulants: Secondary | ICD-10-CM

## 2021-07-05 DIAGNOSIS — N83209 Unspecified ovarian cyst, unspecified side: Secondary | ICD-10-CM | POA: Diagnosis not present

## 2021-07-05 DIAGNOSIS — D62 Acute posthemorrhagic anemia: Secondary | ICD-10-CM | POA: Diagnosis not present

## 2021-07-05 LAB — CBC
HCT: 29.2 % — ABNORMAL LOW (ref 36.0–46.0)
HCT: 29.7 % — ABNORMAL LOW (ref 36.0–46.0)
HCT: 27.7 % — ABNORMAL LOW (ref 36.0–46.0)
Hemoglobin: 10.3 g/dL — ABNORMAL LOW (ref 12.0–15.0)
Hemoglobin: 9.9 g/dL — ABNORMAL LOW (ref 12.0–15.0)
Hemoglobin: 9.6 g/dL — ABNORMAL LOW (ref 12.0–15.0)
MCH: 31.6 pg (ref 26.0–34.0)
MCH: 30.5 pg (ref 26.0–34.0)
MCH: 31.6 pg (ref 26.0–34.0)
MCHC: 35.3 g/dL (ref 30.0–36.0)
MCHC: 33.3 g/dL (ref 30.0–36.0)
MCHC: 34.7 g/dL (ref 30.0–36.0)
MCV: 89.6 fL (ref 80.0–100.0)
MCV: 91.4 fL (ref 80.0–100.0)
MCV: 91.1 fL (ref 80.0–100.0)
Platelets: 117 10*3/uL — ABNORMAL LOW (ref 150–400)
Platelets: 126 10*3/uL — ABNORMAL LOW (ref 150–400)
Platelets: 111 10*3/uL — ABNORMAL LOW (ref 150–400)
RBC: 3.26 MIL/uL — ABNORMAL LOW (ref 3.87–5.11)
RBC: 3.25 MIL/uL — ABNORMAL LOW (ref 3.87–5.11)
RBC: 3.04 MIL/uL — ABNORMAL LOW (ref 3.87–5.11)
RDW: 16.4 % — ABNORMAL HIGH (ref 11.5–15.5)
RDW: 16.2 % — ABNORMAL HIGH (ref 11.5–15.5)
RDW: 16.1 % — ABNORMAL HIGH (ref 11.5–15.5)
WBC: 8.7 10*3/uL (ref 4.0–10.5)
WBC: 6.3 10*3/uL (ref 4.0–10.5)
WBC: 5.5 10*3/uL (ref 4.0–10.5)
nRBC: 0 % (ref 0.0–0.2)
nRBC: 0 % (ref 0.0–0.2)
nRBC: 0 % (ref 0.0–0.2)

## 2021-07-05 LAB — BASIC METABOLIC PANEL
Anion gap: 3 — ABNORMAL LOW (ref 5–15)
BUN: <5 mg/dL — ABNORMAL LOW (ref 6–20)
CO2: 25 mmol/L (ref 22–32)
Calcium: 7.3 mg/dL — ABNORMAL LOW (ref 8.9–10.3)
Chloride: 109 mmol/L (ref 98–111)
Creatinine, Ser: 0.57 mg/dL (ref 0.44–1.00)
GFR, Estimated: >60 mL/min (ref >60)
Glucose, Bld: 90 mg/dL (ref 70–99)
Potassium: 3.2 mmol/L — ABNORMAL LOW (ref 3.5–5.1)
Sodium: 137 mmol/L (ref 135–145)

## 2021-07-05 LAB — TYPE AND SCREEN
ABO/RH(D): AB POS
Antibody Screen: NEGATIVE
Unit division: 0
Unit division: 0

## 2021-07-05 LAB — PROTIME-INR
INR: 1.2 (ref 0.8–1.2)
Prothrombin Time: 15.5 seconds — ABNORMAL HIGH (ref 11.4–15.2)

## 2021-07-05 LAB — BPAM RBC
Blood Product Expiration Date: 202209262359
Blood Product Expiration Date: 202209282359
ISSUE DATE / TIME: 202209221335
ISSUE DATE / TIME: 202209221705
Unit Type and Rh: 6200
Unit Type and Rh: 9500

## 2021-07-05 MED ORDER — CHLORHEXIDINE GLUCONATE CLOTH 2 % EX PADS
6.0000 | MEDICATED_PAD | Freq: Every day | CUTANEOUS | Status: DC
  Administered 2021-07-04 – 2021-07-06 (×3): 6 via TOPICAL

## 2021-07-05 NOTE — Progress Notes (Signed)
Gynecology Progress Note  Admission Date: 07/04/2021 Current Date: 07/05/2021 12:54 PM  Gloria Hamilton is a 25 y.o. G0 HD#2 admitted for hemoperitoneum secondary to hemorrhagic cyst and chronic anticoagulation.   History complicated by: Patient Active Problem List   Diagnosis Date Noted   Hemorrhagic shock (HCC) 07/04/2021   Acute blood loss anemia    H/O heart valve replacement with mechanical valve    Hemorrhagic ovarian cyst    Anticoagulated on Coumadin    Encounter for therapeutic drug monitoring 11/15/2020   Aortic valve replaced 11/15/2020    ROS and patient/family/surgical history, located on admission H&P note dated 07/04/2021, have been reviewed, and there are no changes except as noted below Yesterday/Overnight Events:  2 units PRBC  Subjective:  It appears stable.  She notes pain is 2-3/10.  She has ambulated to the restroom without syncope or lightheadedness.  Pt is s/p 2 units PRBC and coumadin was reversed.  Anticoagulation has not been restarted at this time.  H/h relatively stable this AM.  Current CBC is pending.  Pt notes shoulder pain ahs resolved.  Objective:   Vitals:   07/05/21 1100 07/05/21 1122 07/05/21 1130 07/05/21 1200  BP: (!) 86/65  (!) 82/66 (!) 77/64  Pulse: 83  (!) 148 79  Resp: 18  20 (!) 25  Temp:  98.2 F (36.8 C)  98.2 F (36.8 C)  TempSrc:  Oral  Oral  SpO2: 99%  98% 98%  Weight:      Height:        Temp:  [97.7 F (36.5 C)-99 F (37.2 C)] 98.2 F (36.8 C) (09/23 1200) Pulse Rate:  [73-148] 79 (09/23 1200) Resp:  [12-27] 25 (09/23 1200) BP: (69-95)/(46-66) 77/64 (09/23 1200) SpO2:  [91 %-100 %] 98 % (09/23 1200) Weight:  [63.9 kg-65 kg] 65 kg (09/23 0500) I/O last 3 completed shifts: In: 3786.7 [P.O.:330; I.V.:2002.8; Blood:346.7; IV Piggyback:1107.2] Out: 1150 [Urine:1150] Total I/O In: 250 [I.V.:250] Out: 250 [Urine:250]  Intake/Output Summary (Last 24 hours) at 07/05/2021 1254 Last data filed at 07/05/2021 0900 Gross per 24  hour  Intake 4036.72 ml  Output 1400 ml  Net 2636.72 ml     Current Vital Signs 24h Vital Sign Ranges  T 98.2 F (36.8 C) Temp  Avg: 98.3 F (36.8 C)  Min: 97.7 F (36.5 C)  Max: 99 F (37.2 C)  BP (!) 77/64  BP  Min: 69/58  Max: 95/58  HR 79 Pulse  Avg: 83.4  Min: 73  Max: 148  RR (!) 25  Resp  Avg: 19.7  Min: 12  Max: 27  SaO2 98 % Room Air SpO2  Avg: 97.2 %  Min: 91 %  Max: 100 %       24 Hour I/O Current Shift I/O  Time Ins Outs 09/22 0701 - 09/23 0700 In: 3786.7 [P.O.:330; I.V.:2002.8] Out: 1150 [Urine:1150] 09/23 0701 - 09/23 1900 In: 250 [I.V.:250] Out: 250 [Urine:250]   Patient Vitals for the past 12 hrs:  BP Temp Temp src Pulse Resp SpO2 Weight  07/05/21 1200 (!) 77/64 98.2 F (36.8 C) Oral 79 (!) 25 98 % --  07/05/21 1130 (!) 82/66 -- -- (!) 148 20 98 % --  07/05/21 1122 -- 98.2 F (36.8 C) Oral -- -- -- --  07/05/21 1100 (!) 86/65 -- -- 83 18 99 % --  07/05/21 1030 (!) 82/56 -- -- 82 (!) 22 100 % --  07/05/21 1000 (!) 78/62 -- -- 89 (!) 22 97 % --  07/05/21 0930 (!) 82/62 -- -- 89 20 97 % --  07/05/21 0845 (!) 79/56 -- -- 80 17 97 % --  07/05/21 0830 (!) 78/56 -- -- 79 20 97 % --  07/05/21 0815 (!) 78/58 -- -- 79 20 96 % --  07/05/21 0800 -- 98.4 F (36.9 C) Oral 87 -- -- --  07/05/21 0745 (!) 76/60 -- -- 88 (!) 23 97 % --  07/05/21 0730 (!) 78/57 -- -- 88 20 97 % --  07/05/21 0726 -- 98.4 F (36.9 C) Oral -- -- -- --  07/05/21 0700 (!) 76/55 -- -- 87 15 96 % --  07/05/21 0645 (!) 76/59 -- -- 80 16 96 % --  07/05/21 0630 (!) 75/56 -- -- 88 (!) 22 95 % --  07/05/21 0615 (!) 76/57 -- -- 87 (!) 21 96 % --  07/05/21 0600 (!) 82/55 -- -- 85 14 94 % --  07/05/21 0545 (!) 84/55 -- -- 87 15 94 % --  07/05/21 0530 (!) 87/62 -- -- 92 12 91 % --  07/05/21 0515 (!) 83/58 -- -- 80 15 94 % --  07/05/21 0500 (!) 86/60 -- -- 77 15 94 % 65 kg  07/05/21 0445 (!) 89/61 -- -- 77 20 96 % --  07/05/21 0430 (!) 86/58 -- -- 75 20 98 % --  07/05/21 0415 -- -- -- 75 20 96  % --  07/05/21 0400 -- -- -- 78 (!) 24 98 % --  07/05/21 0345 (!) 83/56 -- -- (!) 145 (!) 26 96 % --  07/05/21 0330 (!) 78/54 -- -- 75 (!) 23 98 % --  07/05/21 0324 -- -- Oral -- -- -- --  07/05/21 0315 (!) 79/55 -- -- 77 13 95 % --  07/05/21 0300 (!) 74/53 -- -- 77 16 95 % --  07/05/21 0245 (!) 77/57 -- -- 79 16 97 % --  07/05/21 0230 (!) 79/60 -- -- 73 13 95 % --  07/05/21 0215 (!) 76/58 -- -- 76 13 95 % --  07/05/21 0200 (!) 78/55 -- -- 75 13 95 % --  07/05/21 0145 (!) 73/54 -- -- 77 14 94 % --  07/05/21 0130 (!) 73/53 -- -- 78 14 94 % --  07/05/21 0115 (!) 76/55 -- -- 79 16 96 % --  07/05/21 0100 (!) 74/55 -- -- 83 16 96 % --     Patient Vitals for the past 24 hrs:  BP Temp Temp src Pulse Resp SpO2 Height Weight  07/05/21 1200 (!) 77/64 98.2 F (36.8 C) Oral 79 (!) 25 98 % -- --  07/05/21 1130 (!) 82/66 -- -- (!) 148 20 98 % -- --  07/05/21 1122 -- 98.2 F (36.8 C) Oral -- -- -- -- --  07/05/21 1100 (!) 86/65 -- -- 83 18 99 % -- --  07/05/21 1030 (!) 82/56 -- -- 82 (!) 22 100 % -- --  07/05/21 1000 (!) 78/62 -- -- 89 (!) 22 97 % -- --  07/05/21 0930 (!) 82/62 -- -- 89 20 97 % -- --  07/05/21 0845 (!) 79/56 -- -- 80 17 97 % -- --  07/05/21 0830 (!) 78/56 -- -- 79 20 97 % -- --  07/05/21 0815 (!) 78/58 -- -- 79 20 96 % -- --  07/05/21 0800 -- 98.4 F (36.9 C) Oral 87 -- -- -- --  07/05/21 0745 (!) 76/60 -- -- 88 (!) 23 97 % -- --  07/05/21 0730 (!) 78/57 -- -- 88 20 97 % -- --  07/05/21 0726 -- 98.4 F (36.9 C) Oral -- -- -- -- --  07/05/21 0700 (!) 76/55 -- -- 87 15 96 % -- --  07/05/21 0645 (!) 76/59 -- -- 80 16 96 % -- --  07/05/21 0630 (!) 75/56 -- -- 88 (!) 22 95 % -- --  07/05/21 0615 (!) 76/57 -- -- 87 (!) 21 96 % -- --  07/05/21 0600 (!) 82/55 -- -- 85 14 94 % -- --  07/05/21 0545 (!) 84/55 -- -- 87 15 94 % -- --  07/05/21 0530 (!) 87/62 -- -- 92 12 91 % -- --  07/05/21 0515 (!) 83/58 -- -- 80 15 94 % -- --  07/05/21 0500 (!) 86/60 -- -- 77 15 94 % -- 65 kg   07/05/21 0445 (!) 89/61 -- -- 77 20 96 % -- --  07/05/21 0430 (!) 86/58 -- -- 75 20 98 % -- --  07/05/21 0415 -- -- -- 75 20 96 % -- --  07/05/21 0400 -- -- -- 78 (!) 24 98 % -- --  07/05/21 0345 (!) 83/56 -- -- (!) 145 (!) 26 96 % -- --  07/05/21 0330 (!) 78/54 -- -- 75 (!) 23 98 % -- --  07/05/21 0324 -- -- Oral -- -- -- -- --  07/05/21 0315 (!) 79/55 -- -- 77 13 95 % -- --  07/05/21 0300 (!) 74/53 -- -- 77 16 95 % -- --  07/05/21 0245 (!) 77/57 -- -- 79 16 97 % -- --  07/05/21 0230 (!) 79/60 -- -- 73 13 95 % -- --  07/05/21 0215 (!) 76/58 -- -- 76 13 95 % -- --  07/05/21 0200 (!) 78/55 -- -- 75 13 95 % -- --  07/05/21 0145 (!) 73/54 -- -- 77 14 94 % -- --  07/05/21 0130 (!) 73/53 -- -- 78 14 94 % -- --  07/05/21 0115 (!) 76/55 -- -- 79 16 96 % -- --  07/05/21 0100 (!) 74/55 -- -- 83 16 96 % -- --  07/05/21 0045 (!) 88/59 -- -- 87 19 97 % -- --  07/05/21 0030 (!) 78/53 -- -- 80 17 97 % -- --  07/05/21 0015 (!) 74/56 -- -- 80 17 96 % -- --  07/05/21 0000 (!) 74/58 -- -- 77 17 97 % -- --  07/04/21 2347 -- 99 F (37.2 C) Oral -- -- -- -- --  07/04/21 2345 (!) 74/59 -- -- 77 16 97 % -- --  07/04/21 2330 (!) 79/60 -- -- 80 (!) 25 97 % -- --  07/04/21 2315 (!) 80/63 -- -- 90 (!) 24 96 % -- --  07/04/21 2300 (!) 83/64 -- -- 85 (!) 23 98 % -- --  07/04/21 2245 (!) 78/63 -- -- 85 (!) 23 98 % -- --  07/04/21 2215 (!) 82/62 -- -- 84 (!) 24 99 % -- --  07/04/21 2200 (!) 76/60 -- -- 83 (!) 26 98 % -- --  07/04/21 2145 (!) 78/58 -- -- 80 (!) 21 99 % -- --  07/04/21 2130 (!) 79/57 -- -- 81 (!) 23 99 % -- --  07/04/21 2115 (!) 82/64 -- -- 79 (!) 24 98 % -- --  07/04/21 2100 (!) 82/66 -- -- 80 (!) 23 97 % -- --  07/04/21 2045 (!) 87/64 -- -- 79 (!) 25 98 % -- --  07/04/21 2030 (!) 82/62 -- -- 91 (!) 27 97 % -- --  07/04/21 2015 (!) 76/57 -- -- 78 (!) 22 97 % -- --  07/04/21 2000 (!) 77/58 -- -- 83 20 98 % -- --  07/04/21 1945 (!) 83/63 -- -- 84 (!) 23 100 % -- --  07/04/21 1943 -- 98.4 F  (36.9 C) Oral -- -- -- -- --  07/04/21 1930 (!) 78/61 -- -- 79 (!) 25 97 % -- --  07/04/21 1915 (!) 78/58 -- -- 78 (!) 22 99 % -- --  07/04/21 1900 (!) 81/62 -- -- 79 (!) 22 100 % -- --  07/04/21 1845 (!) 77/60 -- -- 80 (!) 27 100 % -- --  07/04/21 1843 (!) 77/63 98.3 F (36.8 C) Oral 81 17 100 % -- --  07/04/21 1830 (!) 77/63 -- -- 83 (!) 23 96 % -- --  07/04/21 1815 (!) 81/62 -- -- 86 (!) 22 97 % -- --  07/04/21 1800 (!) 72/57 -- -- 85 (!) 22 99 % -- --  07/04/21 1745 (!) 74/57 -- -- 82 19 100 % -- --  07/04/21 1730 (!) 69/58 -- -- 78 19 99 % -- --  07/04/21 1727 (!) 73/46 98 F (36.7 C) Oral 79 (!) 21 100 % -- --  07/04/21 1715 (!) 72/52 98 F (36.7 C) Oral 78 20 99 % -- --  07/04/21 1709 (!) 84/55 98.1 F (36.7 C) Oral 81 (!) 21 97 % -- --  07/04/21 1700 (!) 71/54 -- -- 78 20 100 % -- --  07/04/21 1635 (!) 80/58 98.3 F (36.8 C) Oral 81 20 91 % 5\' 3"  (1.6 m) 63.9 kg  07/04/21 1622 -- 97.7 F (36.5 C) Oral -- -- -- -- --  07/04/21 1530 (!) 95/58 -- -- 89 13 100 % -- --  07/04/21 1519 (!) 86/60 98.6 F (37 C) Oral 88 17 98 % -- --  07/04/21 1500 94/62 -- -- 80 (!) 26 98 % -- --  07/04/21 1445 (!) 93/57 -- -- 87 20 100 % -- --  07/04/21 1430 (!) 81/55 -- -- 95 18 93 % -- --  07/04/21 1415 (!) 88/56 -- -- 87 (!) 24 98 % -- --  07/04/21 1411 (!) 79/61 98.3 F (36.8 C) Oral 84 19 100 % -- --  07/04/21 1400 91/61 -- -- 82 15 100 % -- --  07/04/21 1354 95/65 98.4 F (36.9 C) Oral 83 16 100 % -- --  07/04/21 1300 (!) 82/59 -- -- 79 (!) 21 100 % -- --    Physical exam: General appearance: alert, cooperative, appears stated age, and no distress Abdomen:  soft, nondistended, diffuse mild tenderness GU: No gross VB Lungs: clear to auscultation bilaterally Heart: regular rate and rhythm Extremities: no lower extremity edema Skin: WNL Psych: appropriate Neurologic: Grossly normal  Medications Current Facility-Administered Medications  Medication Dose Route Frequency Provider  Last Rate Last Admin   0.9 %  sodium chloride infusion  1,000 mL Intravenous Continuous 07/06/21, MD 125 mL/hr at 07/05/21 0900 Infusion Verify at 07/05/21 0900   acetaminophen (TYLENOL) tablet 650 mg  650 mg Oral Q6H PRN 07/07/21, MD   650 mg at 07/04/21 2058   Chlorhexidine Gluconate Cloth 2 % PADS 6 each  6 each Topical Daily 2059, MD   6 each at 07/05/21 0916   docusate sodium (COLACE) capsule 100 mg  100 mg Oral BID PRN Bowser,  Kaylyn Layer, NP       lidocaine (LIDODERM) 5 % 1 patch  1 patch Transdermal Q24H Karl Ito, MD   1 patch at 07/04/21 2110   ondansetron [facility]) injection 4 mg  4 mg Intravenous Q6H PRN Lorin Glass, MD   4 mg at 07/04/21 2234   oxyCODONE (Oxy IR/ROXICODONE) immediate release tablet 5 mg  5 mg Oral Q6H PRN Lorin Glass, MD   5 mg at 07/05/21 0740   polyethylene glycol (MIRALAX / GLYCOLAX) packet 17 g  17 g Oral Daily PRN Lanier Clam, NP          Labs  Recent Labs  Lab 07/04/21 1135 07/04/21 2047 07/05/21 0609  WBC 7.0 11.2* 8.7  HGB 9.8* 11.9* 10.3*  HCT 30.4* 34.0* 29.2*  PLT 168 144* 117*    Recent Labs  Lab 07/04/21 1135  NA 137  K 3.8  CL 108  CO2 26  BUN 8  CREATININE 0.78  CALCIUM 7.6*  PROT 4.8*  BILITOT 0.8  ALKPHOS 33*  ALT 10  AST 16  GLUCOSE 133*    Radiology N/a  Assessment & Plan:  HD #2  hemoperitoneum secondary to hemorrhagic ovarian cyst  and chronic anticoagulation. Pt appears stable.   No tachycardia noted BP technically hypotensive, but she does note her max SBP is usually in the 90s. Good UOP, no signs of hypovolemia. Awaiting current h/h, if stable pt can be allowed to eat.  If significant drop consider repeat CT with contrast to see if there is active bleeding.  If active bleeding could consider pelvic embolization if not contraindicated or laparoscopic cauterization of vessel. Code Status: Full Code  Mariel Aloe, MD Attending Center for [facility])

## 2021-07-05 NOTE — Progress Notes (Signed)
Cardiology Progress Note  Patient ID: Francenia Hamilton MRN: [id number] DOB: July 27, 1996 Date of Encounter: 07/05/2021  Primary Cardiologist: Meriam Sprague, MD  Subjective   Chief Complaint: Abdominal Pain  HPI: Reports abdominal pain is improved.  Hemoglobin is stable.  Denies chest pain or trouble breathing.  ROS:  All other ROS reviewed and negative. Pertinent positives noted in the HPI.     Inpatient Medications  Scheduled Meds:  Chlorhexidine Gluconate Cloth  6 each Topical Daily   lidocaine  1 patch Transdermal Q24H   Continuous Infusions:  sodium chloride 125 mL/hr at 07/05/21 0900   PRN Meds: acetaminophen, docusate sodium, ondansetron (ZOFRAN) IV, oxyCODONE, polyethylene glycol   Vital Signs   Vitals:   07/05/21 0815 07/05/21 0830 07/05/21 0845 07/05/21 0930  BP: (!) 78/58 (!) 78/56 (!) 79/56 (!) 82/62  Pulse: 79 79 80 89  Resp: 20 20 17 20   Temp:      TempSrc:      SpO2: 96% 97% 97% 97%  Weight:      Height:        Intake/Output Summary (Last 24 hours) at 07/05/2021 0956 Last data filed at 07/05/2021 0900 Gross per 24 hour  Intake 4036.72 ml  Output 1400 ml  Net 2636.72 ml   Last 3 Weights 07/05/2021 07/04/2021 07/04/2021  Weight (lbs) 143 lb 4.8 oz 140 lb 14 oz 130 lb  Weight (kg) 65 kg 63.9 kg 58.968 kg      Telemetry  Overnight telemetry shows sinus rhythm in the 80s, which I personally reviewed.   Physical Exam   Vitals:   07/05/21 0815 07/05/21 0830 07/05/21 0845 07/05/21 0930  BP: (!) 78/58 (!) 78/56 (!) 79/56 (!) 82/62  Pulse: 79 79 80 89  Resp: 20 20 17 20   Temp:      TempSrc:      SpO2: 96% 97% 97% 97%  Weight:      Height:        Intake/Output Summary (Last 24 hours) at 07/05/2021 0956 Last data filed at 07/05/2021 0900 Gross per 24 hour  Intake 4036.72 ml  Output 1400 ml  Net 2636.72 ml    Last 3 Weights 07/05/2021 07/04/2021 07/04/2021  Weight (lbs) 143 lb 4.8 oz 140 lb 14 oz 130 lb  Weight (kg) 65 kg 63.9 kg 58.968 kg     Body mass index is 25.38 kg/m.  General: Well nourished, well developed, in no acute distress Head: Atraumatic, normal size  Eyes: PEERLA, EOMI  Neck: Supple, no JVD Endocrine: No thryomegaly Cardiac: Normal S1, mechanical S2 click, faint 2 out of 6 systolic ejection murmur Lungs: Clear to auscultation bilaterally, no wheezing, rhonchi or rales  Abd: Soft, nontender, no hepatomegaly  Ext: No edema, pulses 2+ Musculoskeletal: No deformities, BUE and BLE strength normal and equal Skin: Warm and dry, no rashes   Neuro: Alert and oriented to person, place, time, and situation, CNII-XII grossly intact, no focal deficits  Psych: Normal mood and affect   Labs  High Sensitivity Troponin:  No results for input(s): TROPONINIHS in the last 720 hours.   Cardiac EnzymesNo results for input(s): TROPONINI in the last 168 hours. No results for input(s): TROPIPOC in the last 168 hours.  Chemistry Recent Labs  Lab 07/04/21 1135  NA 137  K 3.8  CL 108  CO2 26  GLUCOSE 133*  BUN 8  CREATININE 0.78  CALCIUM 7.6*  PROT 4.8*  ALBUMIN 3.4*  AST 16  ALT 10  ALKPHOS 33*  BILITOT 0.8  GFRNONAA >60  ANIONGAP 3*    Hematology Recent Labs  Lab 07/04/21 1135 07/04/21 2047 07/05/21 0609  WBC 7.0 11.2* 8.7  RBC 3.13* 3.83* 3.26*  HGB 9.8* 11.9* 10.3*  HCT 30.4* 34.0* 29.2*  MCV 97.1 88.8 89.6  MCH 31.3 31.1 31.6  MCHC 32.2 35.0 35.3  RDW 12.7 16.1* 16.4*  PLT 168 144* 117*   BNPNo results for input(s): BNP, PROBNP in the last 168 hours.  DDimer No results for input(s): DDIMER in the last 168 hours.   Radiology  CT Angio Chest/Abd/Pel for Dissection W and/or Wo Contrast  Result Date: 07/04/2021 CLINICAL DATA:  Acute right chest and abdominal pain, hypotension, tachycardia, near syncopal episode EXAM: CT ANGIOGRAPHY CHEST, ABDOMEN AND PELVIS TECHNIQUE: Multidetector CT imaging through the chest, abdomen and pelvis was performed using the standard protocol during bolus administration of  intravenous contrast. Multiplanar reconstructed images and MIPs were obtained and reviewed to evaluate the vascular anatomy. CONTRAST:  OMNIPAQUE IOHEXOL 350 MG/ML SOLN COMPARISON:  None. FINDINGS: CTA CHEST FINDINGS Cardiovascular: Postop changes of the chest including median sternotomy for aortic valve repair as well as reconstruction of the ascending thoracic aorta. 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 Major branch vessels remain patent. No acute mediastinal hemorrhage or hematoma. Pulmonary arteries are patent without evidence of acute PE. Heart is enlarged. No pericardial effusion. Central venous structures are also patent. No veno-occlusive process. Mediastinum/Nodes: No enlarged mediastinal, hilar, or axillary lymph nodes. Thyroid gland, trachea, and esophagus demonstrate no significant findings. Lungs/Pleura: Lungs are clear. No pleural effusion or pneumothorax. Musculoskeletal: No chest wall abnormality. No acute or significant osseous findings. Review of the MIP images confirms the above findings. CTA ABDOMEN AND PELVIS FINDINGS VASCULAR Aorta: Intact abdominal aorta. Negative for aneurysm or dissection. No retroperitoneal periaortic hemorrhage. Celiac: Widely patent including its branches SMA: Widely patent including its branches Renals: Widely patent including an accessory renal artery on the right. IMA: Widely patent off the distal aorta including its branches Inflow: Pelvic iliac vessels are widely patent. No inflow disease or occlusion. Veins: Dedicated venous phase imaging not performed. Review of the MIP images confirms the above findings. NON-VASCULAR Hepatobiliary: No focal hepatic abnormality. Limited arterial phase imaging only. No biliary obstruction pattern. Gallbladder unremarkable. Common bile duct nondistended. Pancreas: Unremarkable. No pancreatic ductal dilatation or surrounding  inflammatory changes. Spleen: Normal in size without focal abnormality. Adrenals/Urinary Tract: Adrenal glands are unremarkable. Kidneys are normal, without renal calculi, focal lesion, or hydronephrosis. Bladder is unremarkable. Stomach/Bowel: Limited without oral contrast. Negative for bowel obstruction, significant dilatation, ileus pattern, or free air. Normal appendix in the right lower quadrant. Lymphatic: No adenopathy. Reproductive: Diffuse mixed attenuation moderate pelvic adnexal acute hemorrhage surrounding the uterus and ovaries. Hemoperitoneum extends along the pericolic gutters superiorly and around the liver and spleen. In the right adnexa, there is an ill-defined low-density area or lesion measuring 3.4 cm, image 261/6. Along the superior margin of this, there is a focus of hyperattenuation on image 257/6 suspicious for an area of mild active bleeding. Constellation of findings suggest ruptured hemorrhagic ovarian cyst with moderate pelvic acute hematoma and hemoperitoneum. Other: Intact abdominal wall.  Negative for hernia. Musculoskeletal: No acute osseous finding. Review of the MIP images confirms the above findings. IMPRESSION: Chest: Postop changes of the ascending thoracic aorta and aortic valve replacement. Left aortic arch with an aberrant right subclavian artery, normal variant. No acute  intrathoracic vascular or non vascular finding. Abdomen and pelvis: Moderate acute pelvic hematoma surrounding the uterus and adnexa with associated moderate hemoperitoneum extending superiorly throughout the abdomen. Suspect ruptured hemorrhagic/bleeding right ovarian lesion/cyst. Focus of mild active bleeding or contrast extravasation noted within the right hemipelvis hematoma, as above. These results were called by telephone at the time of interpretation on 07/04/2021 at 2:13 pm to provider [facility] , who verbally acknowledged these results. Electronically Signed   By: Judie Petit.  Shick M.D.   On: 07/04/2021  14:15    Cardiac Studies  MRI 03/07/2021 1. The left ventricle is normal in cavity size and wall thickness. Global systolic function is normal with  an LV ejection fraction calculated at 58%. There are no regional wall motion abnormalities.   2. The right ventricle is normal in cavity size, wall thickness, and systolic function.   3. The right atrium is mildly enlarged. The left atrium is normal in size.   4. There is a mechanical prosthetic valve in the aortic position. The leaflets are not well visualized due  to metal artifacts. However, the valve appears to be well-seated and leaflets motion appear to be normal.  Peak aortic flow velocity is 1.2 m/s. There is trivial aortic regurgitation. These findings are consistent  with a normal functioning prosthetic valve.   5. There is moderate pulmonic regurgitation. There is also mild tricuspid regurgitation. There are no other  significant valvular lesions.   6. Delayed enhancement imaging demonstrates no evidence of myocardial infarction, scar or infiltrative  disease.   7.  No intracardiac thrombus visualized.   Patient Profile  Gloria Hamilton is a 25 y.o. female with congenital bicuspid aortic valve (status post Ross procedure with failed aortic prosthesis status post a sending aortic repair with mechanical aortic valve replacement 05/17/2020) who was admitted on 07/04/2021 for hemorrhagic shock secondary to a ruptured ovarian cyst.  Cardiology was consulted for anticoagulation management in the setting of hemorrhaging.  Assessment & Plan   #Ruptured ovarian cyst #Hemorrhagic shock #27/29 mechanical On-x valve in the aortic position (INR goal 1.5-2.5 per [facility]) #EF 58% on cardiac MRI at [facility] in May 2022 #Ascending aortic repair -She has a history of congenital bicuspid aortic valve disease.  She underwent Ross procedure in the past.  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 -She underwent  redo a sending aortic repair as well as mechanical aortic valve replacement at [facility] on 05/17/2020. -There is mention that her EF was 40% in December 2021.  On most recent cardiac MRI at [facility] her EF was 58%. -She has now been admitted with hemorrhagic shock secondary to a ruptured ovarian cyst.  She has been transfused and her hemoglobin is stable.  Her INR was reversed with Kcentra as well as vitamin K.  INR 1.2 this morning. -Would recommend continue to hold anticoagulation.  If she will undergo surgery she needs to have her ovarian cyst removed and this will be okay from my standpoint.  If the cyst is not removed would recommend to challenge her with heparin.  Per the 2017 ACC/AHA valve guidelines mechanical aortic valves of low risk nature do not need bridging.  However given her bleed and history of congenital heart disease it may make more sense to bridge her.  Again this will largely depend on if she needs surgery or not.  For now continue to hold anticoagulation.  It is reassuring that she has a very new mechanical aortic valve  and this is a On-X valve.  These are very low risk for thromboembolic events.  Goal INR per [facility] cardiology is 1.5-2.5. -Cardiology will continue to follow along.  Discussed the plan with critical care medicine.  For questions or updates, please contact [facility] Please consult [email or web address] for contact info under   Time Spent with Patient: I have spent a total of 35 minutes with patient reviewing hospital notes, telemetry, EKGs, labs and examining the patient as well as establishing an assessment and plan that was discussed with the patient.  > 50% of time was spent in direct patient care.    Signed, Lenna Gilford. Flora Lipps, MD, [facility]  [facility]  07/05/2021 9:56 AM

## 2021-07-05 NOTE — Progress Notes (Addendum)
NAME:  Gloria Hamilton, MRN:  [id number], DOB:  01/29/1996, LOS: 1 ADMISSION DATE:  07/04/2021, CONSULTATION DATE:  07/04/21 REFERRING MD:  Lynelle Doctor - EM, CHIEF COMPLAINT:  Abdominal pain : bleeding ovarian cyst    History of Present Illness:  25 yo F PMH congenital aortic insufficieny, aortic root aneurysm s/p repair and mechanical aortic valve replacement ([facility] -- Dr. Kizzie Bane, 2021) on coumadin, presented to ED 9/22 with abdominal pain, n/v/d which began 9/21 evening. OTC medications were not helpful, pain persisted and patient felt lightheaded prompting ED presentation. In ED, SBP 80s.  Hgb 9.8 from 14. CTA c/a/p revealed hemoperitoneum, acute hemorrhage surrounding uterus and ovaries.  In ED, emergency release blood ordered, Vit K given, Kcentra ordered. OB called, rec watchful waiting. Cards consulted for anticoag management. PCCM consulted for admission    Pertinent  Medical History  Congenital aortic insufficiency s/p mechanical replacement  Chronic anticoagulation  Migraines  Frequent UTI   Significant Hospital Events: Including procedures, antibiotic start and stop dates in addition to other pertinent events   9/22 ED for abdominal pain, found to have bleeding ovarian cyst. Getting blood Vit K Kcentra. OB Cards PCCM consulted. 1 U PRBC given  Interim History / Subjective:  Tmax 99  SBP in 70-80s overnight, MAP 65  +2.6L with 1.1 L uop ( overnight)  Subjective no chest pain, no sob, some abd and neck pain, improved compared to admission  Objective   Blood pressure (!) 76/55, pulse 87, temperature 98.4 F (36.9 C), temperature source Oral, resp. rate 15, height 5\' 3"  (1.6 m), weight 65 kg, last menstrual period 06/20/2021, SpO2 96 %.        Intake/Output Summary (Last 24 hours) at 07/05/2021 0757 Last data filed at 07/05/2021 0700 Gross per 24 hour  Intake 3786.68 ml  Output 1150 ml  Net 2636.68 ml    Filed Weights   07/04/21 1124 07/04/21 1635 07/05/21 0500  Weight: 59  kg 63.9 kg 65 kg    Examination: General:  in bed, NAD, appears comfortable HEENT: MM pink/moist, anicteric, atraumatic Neuro: GCS 15, RASS 0, PERRL 53mm CV: S1S2, +murmur, +Click, RRR PULM:  clear in the upper lobes and in the lower lobes, Trachea midline, chest expansion symmetric GI: soft, bsx4 active, generalized tenderness Extremities: warm/dry, no pretibal edema, capillary refill greater/less than 3 seconds  Skin: no rashes or lesions   Resolved Hospital Problem list   Shock   Assessment & Plan:   Acute blood loss anemia- post ovarian cyst rupture Hemorraghic shock Hemoperitoneum due to hemorrhagic ovarian cyst. SBP in 70-80s overnight,  with MAP over 65. +2.6L with 1.1 L uop (03[birth year]-1974 overnight). HBG  9.8> 11.9 S/P 1 U prbc> 10.3. GCS 15. -Continue q8h CBC -OB consulted- no surgical intervention for now. -Plan to transfuse for HGB less than 7 or if acutely unstable -Continue NS at 125 -Keep in ICU. Monitor for HD instability  S/p mechanical aortic valve replacement on chronic coumadin Hx congenital aortic insufficiency, aortic root aneurysm  Vit K and Kcentra in ED. On X MV in place with goal INR 1.5 to 2.5. AM INR 1.2. Cardiac MRI at [facility] on 05/22 with EF of 58%. p -Cards consulted and following. Will follow for recs -Holding on hep gtt today. Will consider starting 9/24. Appreciate cardiology input.   Lactic acidosis- resolved in setting of bleed, hypotension, 2.0>1.9   Best Practice (right click and "Reselect all SmartList Selections" daily)   Diet/type: Regular consistency (see orders) DVT prophylaxis: SCD  GI prophylaxis: N/A Lines: N/A Foley:  N/A Code Status:  full code Last date of multidisciplinary goals of care discussion [9/22 - OB, CCM, Cards ]   Critical care time: 31 min    CRITICAL CARE Performed by: Naida Sleight., MSN, APRN, AGACNP-BC [facility]  07/05/2021 , 7:57 AM  Please see  [email or web address] for pager details  If no response, please call [phone] After hours, please call Elink at [phone]

## 2021-07-05 NOTE — TOC Initial Note (Signed)
Transition of Care [facility]) - Initial/Assessment Note    Patient Details  Name: Gloria Hamilton MRN: [id number] Date of Birth: [birth year]  Transition of Care [facility]) CM/SW Contact:    Tom-Johnson, Hershal Coria, RN Phone Number: 07/05/2021, 4:41 PM  Clinical Narrative:                 CM spoke with patient at bedside. Boyfriend Thayer Ohm and mother Tammy at bedside as well. Patient states she lives with boyfriend and does not have any children. Independent with care prior to hospitalization. No DME's and has not had home health services in the past. Has a PCP and able to pay her co pay for meds. Had 2 unit PRBC transfused this admission. OBGYN consulting for Hemorrhagic Cyst. PT/OT to eval and treat. Denies any TOC needs. CM will continue to follow with needs.   Expected Discharge Plan: Home/Self Care Barriers to Discharge: Continued Medical Work up   Patient Goals and CMS Choice Patient states their goals for this hospitalization and ongoing recovery are:: To go home CMS Medicare.gov Compare Post Acute Care list provided to:: Patient    Expected Discharge Plan and Services Expected Discharge Plan: Home/Self Care       Living arrangements for the past 2 months: Apartment                                      Prior Living Arrangements/Services Living arrangements for the past 2 months: Apartment Lives with:: Significant Other Patient language and need for interpreter reviewed:: Yes Do you feel safe going back to the place where you live?: Yes      Need for Family Participation in Patient Care: Yes (Comment) Care giver support system in place?: Yes (comment)   Criminal Activity/Legal Involvement Pertinent to Current Situation/Hospitalization: No - Comment as needed  Activities of Daily Living   ADL Screening (condition at time of admission) Patient's cognitive ability adequate to safely complete daily activities?: Yes Is the patient deaf or have difficulty hearing?:  No Does the patient have difficulty seeing, even when wearing glasses/contacts?: No Does the patient have difficulty concentrating, remembering, or making decisions?: No Patient able to express need for assistance with ADLs?: Yes Does the patient have difficulty dressing or bathing?: No Independently performs ADLs?: Yes (appropriate for developmental age) Does the patient have difficulty walking or climbing stairs?: No Weakness of Legs: None Weakness of Arms/Hands: None  Permission Sought/Granted Permission sought to share information with : Case Manager, Family Supports Permission granted to share information with : Yes, Verbal Permission Granted              Emotional Assessment Appearance:: Appears stated age Attitude/Demeanor/Rapport: Engaged Affect (typically observed): Accepting, Appropriate, Hopeful Orientation: : Oriented to Self, Oriented to Place, Oriented to  Time, Oriented to Situation Alcohol / Substance Use: Alcohol Use (Occassionally) Psych Involvement: No (comment)  Admission diagnosis:  Hemorrhagic shock (HCC) [R57.8] Acute blood loss anemia [D62] Hemorrhagic ovarian cyst [N83.209] H/O heart valve replacement with mechanical valve [Z95.2] Anticoagulated on Coumadin [Z79.01] Patient Active Problem List   Diagnosis Date Noted   Hemorrhagic shock (HCC) 07/04/2021   Acute blood loss anemia    H/O heart valve replacement with mechanical valve    Hemorrhagic ovarian cyst    Anticoagulated on Coumadin    Encounter for therapeutic drug monitoring 11/15/2020   Aortic valve replaced 11/15/2020   PCP:  Clementeen Graham, PA-C Pharmacy:  [id number] [address] [place] [address] Phone: [phone] Fax: [phone]     Social Determinants of Health (SDOH) Interventions    Readmission Risk Interventions No flowsheet data found.

## 2021-07-06 DIAGNOSIS — Z7901 Long term (current) use of anticoagulants: Secondary | ICD-10-CM | POA: Diagnosis not present

## 2021-07-06 DIAGNOSIS — D62 Acute posthemorrhagic anemia: Secondary | ICD-10-CM | POA: Diagnosis not present

## 2021-07-06 DIAGNOSIS — Z952 Presence of prosthetic heart valve: Secondary | ICD-10-CM | POA: Diagnosis not present

## 2021-07-06 DIAGNOSIS — N83209 Unspecified ovarian cyst, unspecified side: Secondary | ICD-10-CM | POA: Diagnosis not present

## 2021-07-06 DIAGNOSIS — R578 Other shock: Secondary | ICD-10-CM | POA: Diagnosis not present

## 2021-07-06 LAB — CBC
HCT: 28.3 % — ABNORMAL LOW (ref 36.0–46.0)
HCT: 33 % — ABNORMAL LOW (ref 36.0–46.0)
Hemoglobin: 9.6 g/dL — ABNORMAL LOW (ref 12.0–15.0)
Hemoglobin: 11 g/dL — ABNORMAL LOW (ref 12.0–15.0)
MCH: 30.9 pg (ref 26.0–34.0)
MCH: 30.6 pg (ref 26.0–34.0)
MCHC: 33.9 g/dL (ref 30.0–36.0)
MCHC: 33.3 g/dL (ref 30.0–36.0)
MCV: 91 fL (ref 80.0–100.0)
MCV: 91.7 fL (ref 80.0–100.0)
Platelets: 119 10*3/uL — ABNORMAL LOW (ref 150–400)
Platelets: 134 10*3/uL — ABNORMAL LOW (ref 150–400)
RBC: 3.11 MIL/uL — ABNORMAL LOW (ref 3.87–5.11)
RBC: 3.6 MIL/uL — ABNORMAL LOW (ref 3.87–5.11)
RDW: 15.9 % — ABNORMAL HIGH (ref 11.5–15.5)
RDW: 15.3 % (ref 11.5–15.5)
WBC: 6 10*3/uL (ref 4.0–10.5)
WBC: 6.3 10*3/uL (ref 4.0–10.5)
nRBC: 0 % (ref 0.0–0.2)
nRBC: 0 % (ref 0.0–0.2)

## 2021-07-06 LAB — HEPARIN LEVEL (UNFRACTIONATED): Heparin Unfractionated: 0.19 IU/mL — ABNORMAL LOW (ref 0.30–0.70)

## 2021-07-06 LAB — BASIC METABOLIC PANEL
Anion gap: 4 — ABNORMAL LOW (ref 5–15)
BUN: <5 mg/dL — ABNORMAL LOW (ref 6–20)
CO2: 24 mmol/L (ref 22–32)
Calcium: 7.7 mg/dL — ABNORMAL LOW (ref 8.9–10.3)
Chloride: 109 mmol/L (ref 98–111)
Creatinine, Ser: 0.56 mg/dL (ref 0.44–1.00)
GFR, Estimated: >60 mL/min (ref >60)
Glucose, Bld: 112 mg/dL — ABNORMAL HIGH (ref 70–99)
Potassium: 3.6 mmol/L (ref 3.5–5.1)
Sodium: 137 mmol/L (ref 135–145)

## 2021-07-06 LAB — PROTIME-INR
INR: 1.2 (ref 0.8–1.2)
Prothrombin Time: 15.2 seconds (ref 11.4–15.2)

## 2021-07-06 MED ORDER — DOCUSATE SODIUM 100 MG PO CAPS
100.0000 mg | ORAL_CAPSULE | Freq: Two times a day (BID) | ORAL | Status: DC
  Filled 2021-07-06: qty 1

## 2021-07-06 MED ORDER — SENNA 8.6 MG PO TABS
2.0000 | ORAL_TABLET | Freq: Two times a day (BID) | ORAL | Status: DC
  Administered 2021-07-06 – 2021-07-07 (×2): 17.2 mg via ORAL
  Filled 2021-07-06 (×2): qty 2

## 2021-07-06 MED ORDER — DOCUSATE SODIUM 50 MG/5ML PO LIQD
100.0000 mg | Freq: Two times a day (BID) | ORAL | Status: DC
  Administered 2021-07-06: 100 mg via ORAL
  Filled 2021-07-06 (×2): qty 10

## 2021-07-06 MED ORDER — HEPARIN (PORCINE) 25000 UT/250ML-% IV SOLN
1050.0000 [IU]/h | INTRAVENOUS | Status: DC
  Administered 2021-07-06: 800 [IU]/h via INTRAVENOUS
  Filled 2021-07-06: qty 250

## 2021-07-06 NOTE — Progress Notes (Signed)
ANTICOAGULATION CONSULT NOTE - Initial Consult  Pharmacy Consult for IV Heparin Indication:  aortic valve replacement  Allergies  Allergen Reactions   Ceftriaxone Rash    Lower extremity edema   Ciprofloxacin Other (See Comments)    "symptoms more elevated so tries to avoid"    Lisinopril Cough    Patient Measurements: Height: 5\' 3"  (160 cm) Weight: 65 kg (143 lb 4.8 oz) IBW/kg (Calculated) : 52.4 Heparin Dosing Weight: 65 kg  Vital Signs: Temp: 98.4 F (36.9 C) (09/24 0801) Temp Source: Oral (09/24 0801) BP: 82/62 (09/24 0700) Pulse Rate: 93 (09/24 0700)  Labs: Recent Labs    07/04/21 1135 07/04/21 1143 07/04/21 2047 07/05/21 0609 07/05/21 1223 07/05/21 1640 07/06/21 0210  HGB 9.8*  --  11.9* 10.3* 9.9* 9.6* 9.6*  HCT 30.4*  --  34.0* 29.2* 29.7* 27.7* 28.3*  PLT 168  --  144* 117* 126* 111* 119*  LABPROT  --    < > 15.5* 15.5*  --   --  15.2  INR  --    < > 1.2 1.2  --   --  1.2  CREATININE 0.78  --   --   --  0.57  --  0.56   < > = values in this interval not displayed.    Estimated Creatinine Clearance: 97.4 mL/min (by C-G formula based on SCr of 0.56 mg/dL).   Medical History: Past Medical History:  Diagnosis Date   Anxiety    Bicuspid aortic valve    CHF (congestive heart failure) (HCC)    Congenital aortic insufficiency    H/O aortic valve replacement    Heart murmur    Migraines    Transposition of great vessels     Medications:  Medications Prior to Admission  Medication Sig Dispense Refill Last Dose   acetaminophen (TYLENOL) 325 MG tablet 1 tablet as needed   last month   aspirin 81 MG chewable tablet Chew 81 mg by mouth daily.   07/04/2021 at 0800   metoprolol succinate (TOPROL XL) 25 MG 24 hr tablet Take 0.5 tablets (12.5 mg total) by mouth every evening. 45 tablet 3 07/03/2021 at 2345   norethindrone (CAMILA) 0.35 MG tablet Take 1 tablet (0.35 mg total) by mouth daily. 1 Package 5 07/04/2021 at 0800   warfarin (COUMADIN) 1 MG tablet Take  2 tablets (2mg ) by mouth daily along with your warfarin 7.5mg  tablet  as instructed by the Anticoagulation Clinic. (Patient taking differently: Take 2 mg by mouth as directed. Take 2 tablets (2mg ) by mouth daily along with Warfarin 7.5mg  tablet on (Sundays, Mondays, Wednesdays, Thursdays, Fridays) as instructed by the Anticoagulation Clinic.) 45 tablet 3 07/03/2021 at 2345   warfarin (COUMADIN) 7.5 MG tablet Take 1 tablet (7.5mg ) by mouth daily as instructed by the coumadin clinic. (Patient taking differently: Take 7.5 mg by mouth as directed. Take 1 tablet (7.5 mg) by mouth daily along with 2 tablets (2 mg) on (Sundays, Mondays, Wednesdays, Thursdays, Fridays) & Take 1 tablet (7.5 mg) by mouth daily on (Tues & Saturday) as instructed by the Anticoagulation Clinic.) 35 tablet 1 07/03/2021 at 2345   nitrofurantoin, macrocrystal-monohydrate, (MACROBID) 100 MG capsule Take 1 capsule (100 mg total) by mouth 2 (two) times daily. 10 capsule 0 06/30/2021 at 1500    Assessment: 25 years of age female with history of bicuspid aortic valve status post mechanical On-X aortic valve replacement on Warfarin prior to admission who was admitted with abdominal pain and found to have a  ruptured ovarian cyst with hemoperitoneum and hemorrhagic shock. INR 3.2 on presentation and hemoglobin 9.8. Patient was given blood, vitamin K, and Kcentra emergently in ED on 9/22. No plans for surgery. Pharmacy consulted 9/24 AM to resume anticoagulation with IV Heparin for anticoagulation of the valve.   Currently Hemoglobin 9.6 and Platelets 119 (starting to increase after low of 111). INR 1.2. SCr 0.56-stable. On education of monitoring for any bleeding, patient reports that she has a little bit of nosebleed this AM on blowing nose but has now resolved.   Goal of Therapy:  Heparin level 0.3-0.7 units/ml INR 1.5 to 2.5 (valve) Monitor platelets by anticoagulation protocol: Yes   Plan:  Start IV Heparin at 800 units/hr.  No bolus with  recent bleed and hemorrhagic shock.  Heparin level in 6 hours after start of infusion. Daily Heparin level and CBC while on therapy.   Link Snuffer, PharmD, BCPS, BCCCP Clinical Pharmacist Please refer to [facility] for [facility] Pharmacy numbers 07/06/2021,8:31 AM

## 2021-07-06 NOTE — Progress Notes (Signed)
Assessment/Plan: Active Problems:   Hemorrhagic shock (HCC)   Anticoagulated on Coumadin  It appears that her hgb is now stable. Will continue to watch. Resume Coumadin per primary team.  Subjective: Interval History:feels some increased pain. Hgb is stable today.  Objective: Vital signs in last 24 hours: Temp:  [98 F (36.7 C)-99.1 F (37.3 C)] 98.4 F (36.9 C) (09/24 0801) Pulse Rate:  [76-175] 93 (09/24 0700) Resp:  [15-26] 17 (09/24 0700) BP: (70-86)/(50-70) 82/62 (09/24 0700) SpO2:  [77 %-100 %] 97 % (09/24 0700) Weight:  [65 kg] 65 kg (09/24 0441)  Intake/Output from previous day: 09/23 0701 - 09/24 0700 In: 1706.7 [P.O.:270; I.V.:1416.7] Out: 1875 [Urine:1875] Intake/Output this shift: Total I/O In: -  Out: 450 [Urine:450]  BP (!) 82/62   Pulse 93   Temp 98.4 F (36.9 C) (Oral)   Resp 17   Ht 5\' 3"  (1.6 m)   Wt 65 kg   LMP 06/20/2021   SpO2 97%   BMI 25.38 kg/m  General appearance: alert, cooperative, and appears stated age Head: Normocephalic, without obvious abnormality, atraumatic Neck: supple, symmetrical, trachea midline Lungs:  normal effort Heart: regular rate and rhythm Abdomen:  soft, mildly distended, no rebound Extremities: extremities normal, atraumatic, no cyanosis or edema Skin: Skin color, texture, turgor normal. No rashes or lesions  Results for orders placed or performed during the hospital encounter of 07/04/21 (from the past 24 hour(s))  BLOOD TRANSFUSION REPORT - SCANNED     Status: None   Collection Time: 07/05/21 10:39 AM   Narrative   Ordered by an unspecified provider.  CBC     Status: Abnormal   Collection Time: 07/05/21 12:23 PM  Result Value Ref Range   WBC 6.3 4.0 - 10.5 K/uL   RBC 3.25 (L) 3.87 - 5.11 MIL/uL   Hemoglobin 9.9 (L) 12.0 - 15.0 g/dL   HCT 07/07/21 (L) 02.7 - 25.3 %   MCV 91.4 80.0 - 100.0 fL   MCH 30.5 26.0 - 34.0 pg   MCHC 33.3 30.0 - 36.0 g/dL   RDW 66.4 (H) 40.3 - 47.4 %   Platelets 126 (L) 150 -  400 K/uL   nRBC 0.0 0.0 - 0.2 %  Basic metabolic panel     Status: Abnormal   Collection Time: 07/05/21 12:23 PM  Result Value Ref Range   Sodium 137 135 - 145 mmol/L   Potassium 3.2 (L) 3.5 - 5.1 mmol/L   Chloride 109 98 - 111 mmol/L   CO2 25 22 - 32 mmol/L   Glucose, Bld 90 70 - 99 mg/dL   BUN <5 (L) 6 - 20 mg/dL   Creatinine, Ser 07/07/21 0.44 - 1.00 mg/dL   Calcium 7.3 (L) 8.9 - 10.3 mg/dL   GFR, Estimated 5.63 >87 mL/min   Anion gap 3 (L) 5 - 15  CBC     Status: Abnormal   Collection Time: 07/05/21  4:40 PM  Result Value Ref Range   WBC 5.5 4.0 - 10.5 K/uL   RBC 3.04 (L) 3.87 - 5.11 MIL/uL   Hemoglobin 9.6 (L) 12.0 - 15.0 g/dL   HCT 07/07/21 (L) 43.3 - 29.5 %   MCV 91.1 80.0 - 100.0 fL   MCH 31.6 26.0 - 34.0 pg   MCHC 34.7 30.0 - 36.0 g/dL   RDW 18.8 (H) 41.6 - 60.6 %   Platelets 111 (L) 150 - 400 K/uL   nRBC 0.0 0.0 - 0.2 %  Protime-INR  Status: None   Collection Time: 07/06/21  2:10 AM  Result Value Ref Range   Prothrombin Time 15.2 11.4 - 15.2 seconds   INR 1.2 0.8 - 1.2  CBC     Status: Abnormal   Collection Time: 07/06/21  2:10 AM  Result Value Ref Range   WBC 6.0 4.0 - 10.5 K/uL   RBC 3.11 (L) 3.87 - 5.11 MIL/uL   Hemoglobin 9.6 (L) 12.0 - 15.0 g/dL   HCT 36.6 (L) 44.0 - 34.7 %   MCV 91.0 80.0 - 100.0 fL   MCH 30.9 26.0 - 34.0 pg   MCHC 33.9 30.0 - 36.0 g/dL   RDW 42.5 (H) 95.6 - 38.7 %   Platelets 119 (L) 150 - 400 K/uL   nRBC 0.0 0.0 - 0.2 %  Basic metabolic panel     Status: Abnormal   Collection Time: 07/06/21  2:10 AM  Result Value Ref Range   Sodium 137 135 - 145 mmol/L   Potassium 3.6 3.5 - 5.1 mmol/L   Chloride 109 98 - 111 mmol/L   CO2 24 22 - 32 mmol/L   Glucose, Bld 112 (H) 70 - 99 mg/dL   BUN <5 (L) 6 - 20 mg/dL   Creatinine, Ser 5.64 0.44 - 1.00 mg/dL   Calcium 7.7 (L) 8.9 - 10.3 mg/dL   GFR, Estimated >33 >29 mL/min   Anion gap 4 (L) 5 - 15    Studies/Results: CT Angio Chest/Abd/Pel for Dissection W and/or Wo Contrast  Result Date:  07/04/2021 CLINICAL DATA:  Acute right chest and abdominal pain, hypotension, tachycardia, near syncopal episode EXAM: CT ANGIOGRAPHY CHEST, ABDOMEN AND PELVIS TECHNIQUE: Multidetector CT imaging through the chest, abdomen and pelvis was performed using the standard protocol during bolus administration of intravenous contrast. Multiplanar reconstructed images and MIPs were obtained and reviewed to evaluate the vascular anatomy. CONTRAST:  OMNIPAQUE IOHEXOL 350 MG/ML SOLN COMPARISON:  None. FINDINGS: CTA CHEST FINDINGS Cardiovascular: Postop changes of the chest including median sternotomy for aortic valve repair as well as reconstruction of the ascending thoracic aorta. 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 Major branch vessels remain patent. No acute mediastinal hemorrhage or hematoma. Pulmonary arteries are patent without evidence of acute PE. Heart is enlarged. No pericardial effusion. Central venous structures are also patent. No veno-occlusive process. Mediastinum/Nodes: No enlarged mediastinal, hilar, or axillary lymph nodes. Thyroid gland, trachea, and esophagus demonstrate no significant findings. Lungs/Pleura: Lungs are clear. No pleural effusion or pneumothorax. Musculoskeletal: No chest wall abnormality. No acute or significant osseous findings. Review of the MIP images confirms the above findings. CTA ABDOMEN AND PELVIS FINDINGS VASCULAR Aorta: Intact abdominal aorta. Negative for aneurysm or dissection. No retroperitoneal periaortic hemorrhage. Celiac: Widely patent including its branches SMA: Widely patent including its branches Renals: Widely patent including an accessory renal artery on the right. IMA: Widely patent off the distal aorta including its branches Inflow: Pelvic iliac vessels are widely patent. No inflow disease or occlusion. Veins: Dedicated venous phase imaging not performed. Review of  the MIP images confirms the above findings. NON-VASCULAR Hepatobiliary: No focal hepatic abnormality. Limited arterial phase imaging only. No biliary obstruction pattern. Gallbladder unremarkable. Common bile duct nondistended. Pancreas: Unremarkable. No pancreatic ductal dilatation or surrounding inflammatory changes. Spleen: Normal in size without focal abnormality. Adrenals/Urinary Tract: Adrenal glands are unremarkable. Kidneys are normal, without renal calculi, focal lesion, or hydronephrosis. Bladder is unremarkable. Stomach/Bowel: Limited without  oral contrast. Negative for bowel obstruction, significant dilatation, ileus pattern, or free air. Normal appendix in the right lower quadrant. Lymphatic: No adenopathy. Reproductive: Diffuse mixed attenuation moderate pelvic adnexal acute hemorrhage surrounding the uterus and ovaries. Hemoperitoneum extends along the pericolic gutters superiorly and around the liver and spleen. In the right adnexa, there is an ill-defined low-density area or lesion measuring 3.4 cm, image 261/6. Along the superior margin of this, there is a focus of hyperattenuation on image 257/6 suspicious for an area of mild active bleeding. Constellation of findings suggest ruptured hemorrhagic ovarian cyst with moderate pelvic acute hematoma and hemoperitoneum. Other: Intact abdominal wall.  Negative for hernia. Musculoskeletal: No acute osseous finding. Review of the MIP images confirms the above findings. IMPRESSION: Chest: Postop changes of the ascending thoracic aorta and aortic valve replacement. Left aortic arch with an aberrant right subclavian artery, normal variant. No acute intrathoracic vascular or non vascular finding. Abdomen and pelvis: Moderate acute pelvic hematoma surrounding the uterus and adnexa with associated moderate hemoperitoneum extending superiorly throughout the abdomen. Suspect ruptured hemorrhagic/bleeding right ovarian lesion/cyst. Focus of mild active bleeding or  contrast extravasation noted within the right hemipelvis hematoma, as above. These results were called by telephone at the time of interpretation on 07/04/2021 at 2:13 pm to provider [facility] , who verbally acknowledged these results. Electronically Signed   By: Judie Petit.  Shick M.D.   On: 07/04/2021 14:15    Scheduled Meds:  Chlorhexidine Gluconate Cloth  6 each Topical Daily   docusate sodium  100 mg Oral BID   lidocaine  1 patch Transdermal Q24H   Continuous Infusions: PRN Meds:acetaminophen, ondansetron (ZOFRAN) IV, oxyCODONE, polyethylene glycol    LOS: 2 days   Reva Bores, MD 07/06/2021 8:29 AM

## 2021-07-06 NOTE — Progress Notes (Signed)
NAME:  Gloria Hamilton, MRN:  [id number], DOB:  04/23/1996, LOS: 2 ADMISSION DATE:  07/04/2021, CONSULTATION DATE:  07/04/21 REFERRING MD:  Lynelle Doctor - EM, CHIEF COMPLAINT:  Abdominal pain : bleeding ovarian cyst    History of Present Illness:  25 yo F PMH congenital aortic insufficieny, aortic root aneurysm s/p repair and mechanical aortic valve replacement ([facility] -- Dr. Kizzie Bane, 2021) on coumadin, presented to ED 9/22 with abdominal pain, n/v/d which began 9/21 evening. OTC medications were not helpful, pain persisted and patient felt lightheaded prompting ED presentation. In ED, SBP 80s.  Hgb 9.8 from 14. CTA c/a/p revealed hemoperitoneum, acute hemorrhage surrounding uterus and ovaries.  In ED, emergency release blood ordered, Vit K given, Kcentra ordered. OB called, rec watchful waiting. Cards consulted for anticoag management. PCCM consulted for admission    Pertinent  Medical History  Congenital aortic insufficiency s/p mechanical replacement  Chronic anticoagulation  Migraines  Frequent UTI   Significant Hospital Events: Including procedures, antibiotic start and stop dates in addition to other pertinent events   9/22 ED for abdominal pain, found to have bleeding ovarian cyst. Getting blood Vit K Kcentra. OB Cards PCCM consulted. 1 U PRBC given 9/23 H/H remained stable, no acute need for surgery  Interim History / Subjective:   Started diet last night and is tolerating it well. She feels some generalized abdominal bloating this morning. She is ambulating to the bathroom without issue.  Objective   Blood pressure (!) 82/62, pulse 93, temperature 98.4 F (36.9 C), temperature source Oral, resp. rate 17, height 5\' 3"  (1.6 m), weight 65 kg, last menstrual period 06/20/2021, SpO2 97 %.        Intake/Output Summary (Last 24 hours) at 07/06/2021 0826 Last data filed at 07/06/2021 0758 Gross per 24 hour  Intake 1581.64 ml  Output 2325 ml  Net -743.36 ml   Filed Weights   07/04/21 1635  07/05/21 0500 07/06/21 0441  Weight: 63.9 kg 65 kg 65 kg    Examination: General:  sitting up in chair, NAD, appears comfortable HEENT: MM pink/moist, anicteric, atraumatic Neuro: alert and oriented, moving all extremities CV: rrr, +murmur, +Click,  PULM:  diminished breath  GI: soft, bsx4 active, generalized tenderness Extremities: warm/dry, no pretibal edema, capillary refill greater/less than 3 seconds  Skin: no rashes or lesions   Resolved Hospital Problem list   Shock  Lactic Acidosis  Assessment & Plan:   Acute blood loss anemia- post ovarian cyst rupture Hemorraghic shock Shock has resolved with resolution of lactic acidosis -OB consulted- no surgical intervention for now. -Plan to transfuse for HGB less than 7 or if acutely unstable -Keep in ICU. Monitor for HD instability  S/p mechanical aortic valve replacement on chronic coumadin Hx congenital aortic insufficiency, aortic root aneurysm  Had INR reversal upon admission for INR of 3.2. INR goal is 1.5-2.5 for mechanical valve.  Cardiac MRI at [facility] on 05/22 with EF of 58%. p -Cards consulted  -Start hep gtt challenge today. Monitor H/H at 4:30pm then every 8hrs there after.  Best Practice (right click and "Reselect all SmartList Selections" daily)   Diet/type: Regular consistency (see orders) DVT prophylaxis: SCD GI prophylaxis: N/A Lines: N/A Foley:  N/A Code Status:  full code Last date of multidisciplinary goals of care discussion [9/22 - OB, CCM, Cards ]   Critical care time:  n/a    07-24-1977, MD [facility] Office: [phone]   See Amion for personal pager PCCM on call  pager [phone] until 7pm. Please call Elink 7p-7a. [phone]

## 2021-07-06 NOTE — Progress Notes (Signed)
ANTICOAGULATION CONSULT NOTE  Pharmacy Consult for IV Heparin Indication:  aortic valve replacement  Allergies  Allergen Reactions   Ceftriaxone Rash    Lower extremity edema   Ciprofloxacin Other (See Comments)    "symptoms more elevated so tries to avoid"    Lisinopril Cough    Patient Measurements: Height: 5\' 3"  (160 cm) Weight: 65 kg (143 lb 4.8 oz) IBW/kg (Calculated) : 52.4 Heparin Dosing Weight: 65 kg  Vital Signs: Temp: 101.1 F (38.4 C) (09/24 1631) Temp Source: Oral (09/24 1631) BP: 92/69 (09/24 1600) Pulse Rate: 105 (09/24 1631)  Labs: Recent Labs    07/04/21 1135 07/04/21 1143 07/04/21 2047 07/05/21 0609 07/05/21 1223 07/05/21 1640 07/06/21 0210 07/06/21 1619  HGB 9.8*  --  11.9* 10.3* 9.9* 9.6* 9.6* 11.0*  HCT 30.4*  --  34.0* 29.2* 29.7* 27.7* 28.3* 33.0*  PLT 168  --  144* 117* 126* 111* 119* 134*  LABPROT  --    < > 15.5* 15.5*  --   --  15.2  --   INR  --    < > 1.2 1.2  --   --  1.2  --   HEPARINUNFRC  --   --   --   --   --   --   --  0.19*  CREATININE 0.78  --   --   --  0.57  --  0.56  --    < > = values in this interval not displayed.     Estimated Creatinine Clearance: 97.4 mL/min (by C-G formula based on SCr of 0.56 mg/dL).   Medical History: Past Medical History:  Diagnosis Date   Anxiety    Bicuspid aortic valve    CHF (congestive heart failure) (HCC)    Congenital aortic insufficiency    H/O aortic valve replacement    Heart murmur    Migraines    Transposition of great vessels     Medications:  Medications Prior to Admission  Medication Sig Dispense Refill Last Dose   acetaminophen (TYLENOL) 325 MG tablet 1 tablet as needed   last month   aspirin 81 MG chewable tablet Chew 81 mg by mouth daily.   07/04/2021 at 0800   metoprolol succinate (TOPROL XL) 25 MG 24 hr tablet Take 0.5 tablets (12.5 mg total) by mouth every evening. 45 tablet 3 07/03/2021 at 2345   norethindrone (CAMILA) 0.35 MG tablet Take 1 tablet (0.35 mg  total) by mouth daily. 1 Package 5 07/04/2021 at 0800   warfarin (COUMADIN) 1 MG tablet Take 2 tablets (2mg ) by mouth daily along with your warfarin 7.5mg  tablet  as instructed by the Anticoagulation Clinic. (Patient taking differently: Take 2 mg by mouth as directed. Take 2 tablets (2mg ) by mouth daily along with Warfarin 7.5mg  tablet on (Sundays, Mondays, Wednesdays, Thursdays, Fridays) as instructed by the Anticoagulation Clinic.) 45 tablet 3 07/03/2021 at 2345   warfarin (COUMADIN) 7.5 MG tablet Take 1 tablet (7.5mg ) by mouth daily as instructed by the coumadin clinic. (Patient taking differently: Take 7.5 mg by mouth as directed. Take 1 tablet (7.5 mg) by mouth daily along with 2 tablets (2 mg) on (Sundays, Mondays, Wednesdays, Thursdays, Fridays) & Take 1 tablet (7.5 mg) by mouth daily on (Tues & Saturday) as instructed by the Anticoagulation Clinic.) 35 tablet 1 07/03/2021 at 2345   nitrofurantoin, macrocrystal-monohydrate, (MACROBID) 100 MG capsule Take 1 capsule (100 mg total) by mouth 2 (two) times daily. 10 capsule 0 06/30/2021 at 1500  Assessment: 25 years of age female with history of bicuspid aortic valve status post mechanical On-X aortic valve replacement on Warfarin prior to admission who was admitted with abdominal pain and found to have a ruptured ovarian cyst with hemoperitoneum and hemorrhagic shock. INR 3.2 on presentation and hemoglobin 9.8. Patient was given blood, vitamin K, and Kcentra emergently in ED on 9/22. No plans for surgery. Pharmacy consulted 9/24 AM to resume anticoagulation with IV Heparin for anticoagulation of the valve.   Initial heparin level came back subtherapeutic at 0.19, on 800 units/hr. Hgb 11, plt 134. No s/sx of bleeding or infusion issues.   Goal of Therapy:  Heparin level 0.3-0.7 units/ml INR 1.5 to 2.5 (valve) Monitor platelets by anticoagulation protocol: Yes   Plan:  Increase IV Heparin at 950 units/hr.  No bolus with recent bleed and hemorrhagic  shock.  Heparin level in 6 hours. Daily Heparin level and CBC while on therapy.   Sherron Monday, PharmD, BCCCP Clinical Pharmacist  Phone: [phone] 07/06/2021 5:48 PM  Please check AMION for all [facility] phone numbers After 10:00 PM, call Main Pharmacy [phone]

## 2021-07-06 NOTE — Progress Notes (Signed)
Cardiology Progress Note  Patient ID: Gloria Hamilton MRN: [id number] DOB: 1995/12/27 Date of Encounter: 07/06/2021  Primary Cardiologist: Meriam Sprague, MD  Subjective   Sitting in chair eating breakfast No plans for surgery   ROS:  All other ROS reviewed and negative. Pertinent positives noted in the HPI.     Inpatient Medications  Scheduled Meds:  Chlorhexidine Gluconate Cloth  6 each Topical Daily   docusate sodium  100 mg Oral BID   lidocaine  1 patch Transdermal Q24H   Continuous Infusions:   PRN Meds: acetaminophen, ondansetron (ZOFRAN) IV, oxyCODONE, polyethylene glycol   Vital Signs   Vitals:   07/06/21 0600 07/06/21 0630 07/06/21 0700 07/06/21 0801  BP: (!) 81/61 (!) 78/63 (!) 82/62   Pulse: 82 85 93   Resp: 20 (!) 24 17   Temp:    98.4 F (36.9 C)  TempSrc:    Oral  SpO2: 99% 96% 97%   Weight:      Height:        Intake/Output Summary (Last 24 hours) at 07/06/2021 0831 Last data filed at 07/06/2021 0758 Gross per 24 hour  Intake 1581.64 ml  Output 2325 ml  Net -743.36 ml   Last 3 Weights 07/06/2021 07/05/2021 07/04/2021  Weight (lbs) 143 lb 4.8 oz 143 lb 4.8 oz 140 lb 14 oz  Weight (kg) 65 kg 65 kg 63.9 kg      Telemetry  Overnight telemetry shows sinus rhythm in the 80s, which I personally reviewed.   Physical Exam   Vitals:   07/06/21 0600 07/06/21 0630 07/06/21 0700 07/06/21 0801  BP: (!) 81/61 (!) 78/63 (!) 82/62   Pulse: 82 85 93   Resp: 20 (!) 24 17   Temp:    98.4 F (36.9 C)  TempSrc:    Oral  SpO2: 99% 96% 97%   Weight:      Height:        Intake/Output Summary (Last 24 hours) at 07/06/2021 0831 Last data filed at 07/06/2021 0758 Gross per 24 hour  Intake 1581.64 ml  Output 2325 ml  Net -743.36 ml    Last 3 Weights 07/06/2021 07/05/2021 07/04/2021  Weight (lbs) 143 lb 4.8 oz 143 lb 4.8 oz 140 lb 14 oz  Weight (kg) 65 kg 65 kg 63.9 kg    Body mass index is 25.38 kg/m.   Post sternotomy Pale SEM through AVR no AR good  valve clicks BS positive  No edema  Labs  High Sensitivity Troponin:  No results for input(s): TROPONINIHS in the last 720 hours.   Cardiac EnzymesNo results for input(s): TROPONINI in the last 168 hours. No results for input(s): TROPIPOC in the last 168 hours.  Chemistry Recent Labs  Lab 07/04/21 1135 07/05/21 1223 07/06/21 0210  NA 137 137 137  K 3.8 3.2* 3.6  CL 108 109 109  CO2 26 25 24   GLUCOSE 133* 90 112*  BUN 8 <5* <5*  CREATININE 0.78 0.57 0.56  CALCIUM 7.6* 7.3* 7.7*  PROT 4.8*  --   --   ALBUMIN 3.4*  --   --   AST 16  --   --   ALT 10  --   --   ALKPHOS 33*  --   --   BILITOT 0.8  --   --   GFRNONAA >60 >60 >60  ANIONGAP 3* 3* 4*    Hematology Recent Labs  Lab 07/05/21 1223 07/05/21 1640 07/06/21 0210  WBC 6.3 5.5 6.0  RBC 3.25* 3.04* 3.11*  HGB 9.9* 9.6* 9.6*  HCT 29.7* 27.7* 28.3*  MCV 91.4 91.1 91.0  MCH 30.5 31.6 30.9  MCHC 33.3 34.7 33.9  RDW 16.2* 16.1* 15.9*  PLT 126* 111* 119*   BNPNo results for input(s): BNP, PROBNP in the last 168 hours.  DDimer No results for input(s): DDIMER in the last 168 hours.   Radiology  CT Angio Chest/Abd/Pel for Dissection W and/or Wo Contrast  Result Date: 07/04/2021 CLINICAL DATA:  Acute right chest and abdominal pain, hypotension, tachycardia, near syncopal episode EXAM: CT ANGIOGRAPHY CHEST, ABDOMEN AND PELVIS TECHNIQUE: Multidetector CT imaging through the chest, abdomen and pelvis was performed using the standard protocol during bolus administration of intravenous contrast. Multiplanar reconstructed images and MIPs were obtained and reviewed to evaluate the vascular anatomy. CONTRAST:  OMNIPAQUE IOHEXOL 350 MG/ML SOLN COMPARISON:  None. FINDINGS: CTA CHEST FINDINGS Cardiovascular: Postop changes of the chest including median sternotomy for aortic valve repair as well as reconstruction of the ascending thoracic aorta. 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 Major branch vessels remain patent. No acute mediastinal hemorrhage or hematoma. Pulmonary arteries are patent without evidence of acute PE. Heart is enlarged. No pericardial effusion. Central venous structures are also patent. No veno-occlusive process. Mediastinum/Nodes: No enlarged mediastinal, hilar, or axillary lymph nodes. Thyroid gland, trachea, and esophagus demonstrate no significant findings. Lungs/Pleura: Lungs are clear. No pleural effusion or pneumothorax. Musculoskeletal: No chest wall abnormality. No acute or significant osseous findings. Review of the MIP images confirms the above findings. CTA ABDOMEN AND PELVIS FINDINGS VASCULAR Aorta: Intact abdominal aorta. Negative for aneurysm or dissection. No retroperitoneal periaortic hemorrhage. Celiac: Widely patent including its branches SMA: Widely patent including its branches Renals: Widely patent including an accessory renal artery on the right. IMA: Widely patent off the distal aorta including its branches Inflow: Pelvic iliac vessels are widely patent. No inflow disease or occlusion. Veins: Dedicated venous phase imaging not performed. Review of the MIP images confirms the above findings. NON-VASCULAR Hepatobiliary: No focal hepatic abnormality. Limited arterial phase imaging only. No biliary obstruction pattern. Gallbladder unremarkable. Common bile duct nondistended. Pancreas: Unremarkable. No pancreatic ductal dilatation or surrounding inflammatory changes. Spleen: Normal in size without focal abnormality. Adrenals/Urinary Tract: Adrenal glands are unremarkable. Kidneys are normal, without renal calculi, focal lesion, or hydronephrosis. Bladder is unremarkable. Stomach/Bowel: Limited without oral contrast. Negative for bowel obstruction, significant dilatation, ileus pattern, or free air. Normal appendix in the right lower quadrant. Lymphatic: No adenopathy. Reproductive: Diffuse mixed attenuation  moderate pelvic adnexal acute hemorrhage surrounding the uterus and ovaries. Hemoperitoneum extends along the pericolic gutters superiorly and around the liver and spleen. In the right adnexa, there is an ill-defined low-density area or lesion measuring 3.4 cm, image 261/6. Along the superior margin of this, there is a focus of hyperattenuation on image 257/6 suspicious for an area of mild active bleeding. Constellation of findings suggest ruptured hemorrhagic ovarian cyst with moderate pelvic acute hematoma and hemoperitoneum. Other: Intact abdominal wall.  Negative for hernia. Musculoskeletal: No acute osseous finding. Review of the MIP images confirms the above findings. IMPRESSION: Chest: Postop changes of the ascending thoracic aorta and aortic valve replacement. Left aortic arch with an aberrant right subclavian artery, normal variant. No acute intrathoracic vascular or non vascular finding. Abdomen and pelvis: Moderate acute pelvic hematoma surrounding the uterus and adnexa with associated moderate hemoperitoneum extending superiorly throughout the abdomen. Suspect  ruptured hemorrhagic/bleeding right ovarian lesion/cyst. Focus of mild active bleeding or contrast extravasation noted within the right hemipelvis hematoma, as above. These results were called by telephone at the time of interpretation on 07/04/2021 at 2:13 pm to provider [facility] , who verbally acknowledged these results. Electronically Signed   By: Judie Petit.  Shick M.D.   On: 07/04/2021 14:15    Cardiac Studies  MRI 03/07/2021 1. The left ventricle is normal in cavity size and wall thickness. Global systolic function is normal with  an LV ejection fraction calculated at 58%. There are no regional wall motion abnormalities.   2. The right ventricle is normal in cavity size, wall thickness, and systolic function.   3. The right atrium is mildly enlarged. The left atrium is normal in size.   4. There is a mechanical prosthetic valve in the aortic  position. The leaflets are not well visualized due  to metal artifacts. However, the valve appears to be well-seated and leaflets motion appear to be normal.  Peak aortic flow velocity is 1.2 m/s. There is trivial aortic regurgitation. These findings are consistent  with a normal functioning prosthetic valve.   5. There is moderate pulmonic regurgitation. There is also mild tricuspid regurgitation. There are no other  significant valvular lesions.   6. Delayed enhancement imaging demonstrates no evidence of myocardial infarction, scar or infiltrative  disease.   7.  No intracardiac thrombus visualized.   Patient Profile  Gloria Hamilton is a 25 y.o. female with congenital bicuspid aortic valve (status post Ross procedure with failed aortic prosthesis status post a sending aortic repair with mechanical aortic valve replacement 05/17/2020) who was admitted on 07/04/2021 for hemorrhagic shock secondary to a ruptured ovarian cyst.  Cardiology was consulted for anticoagulation management in the setting of hemorrhaging.  Assessment & Plan   Hemorrhagic Shock:  ovarian cyst rupture No plans for surgery Hb stable Has ON- X AVR requires low level anticoagulation 1.5-2.5 Normal function and exam Heparin challenge today and resume coumadin in am if Hb stable  And no change in abdominal exam   For questions or updates, please contact [facility] Please consult [email or web address] for contact info under   Charlton Haws MD [facility]

## 2021-07-06 NOTE — Progress Notes (Signed)
eLink Physician-Brief Progress Note Patient Name: Gloria Hamilton DOB: 07/26/1996 MRN: [id number]   Date of Service  07/06/2021  HPI/Events of Note  As per patient/RN request, changed colace to [facility] tabs. Can swallow it better.   eICU Interventions  As above     Intervention Category Minor Interventions: Other:  Ranee Gosselin 07/06/2021, 10:00 PM

## 2021-07-07 DIAGNOSIS — Z952 Presence of prosthetic heart valve: Secondary | ICD-10-CM | POA: Diagnosis not present

## 2021-07-07 DIAGNOSIS — Z7901 Long term (current) use of anticoagulants: Secondary | ICD-10-CM | POA: Diagnosis not present

## 2021-07-07 DIAGNOSIS — N83209 Unspecified ovarian cyst, unspecified side: Secondary | ICD-10-CM | POA: Diagnosis not present

## 2021-07-07 DIAGNOSIS — R578 Other shock: Secondary | ICD-10-CM | POA: Diagnosis not present

## 2021-07-07 LAB — CBC
HCT: 30.7 % — ABNORMAL LOW (ref 36.0–46.0)
HCT: 29.5 % — ABNORMAL LOW (ref 36.0–46.0)
Hemoglobin: 10.3 g/dL — ABNORMAL LOW (ref 12.0–15.0)
Hemoglobin: 10 g/dL — ABNORMAL LOW (ref 12.0–15.0)
MCH: 30.6 pg (ref 26.0–34.0)
MCH: 30.9 pg (ref 26.0–34.0)
MCHC: 33.6 g/dL (ref 30.0–36.0)
MCHC: 33.9 g/dL (ref 30.0–36.0)
MCV: 91.1 fL (ref 80.0–100.0)
MCV: 91 fL (ref 80.0–100.0)
Platelets: 121 10*3/uL — ABNORMAL LOW (ref 150–400)
Platelets: 121 10*3/uL — ABNORMAL LOW (ref 150–400)
RBC: 3.37 MIL/uL — ABNORMAL LOW (ref 3.87–5.11)
RBC: 3.24 MIL/uL — ABNORMAL LOW (ref 3.87–5.11)
RDW: 15.2 % (ref 11.5–15.5)
RDW: 14.9 % (ref 11.5–15.5)
WBC: 5.1 10*3/uL (ref 4.0–10.5)
WBC: 3.6 10*3/uL — ABNORMAL LOW (ref 4.0–10.5)
nRBC: 0 % (ref 0.0–0.2)
nRBC: 0 % (ref 0.0–0.2)

## 2021-07-07 LAB — BASIC METABOLIC PANEL
Anion gap: 6 (ref 5–15)
BUN: <5 mg/dL — ABNORMAL LOW (ref 6–20)
CO2: 25 mmol/L (ref 22–32)
Calcium: 8.3 mg/dL — ABNORMAL LOW (ref 8.9–10.3)
Chloride: 106 mmol/L (ref 98–111)
Creatinine, Ser: 0.56 mg/dL (ref 0.44–1.00)
GFR, Estimated: >60 mL/min (ref >60)
Glucose, Bld: 113 mg/dL — ABNORMAL HIGH (ref 70–99)
Potassium: 3.4 mmol/L — ABNORMAL LOW (ref 3.5–5.1)
Sodium: 137 mmol/L (ref 135–145)

## 2021-07-07 LAB — PROTIME-INR
INR: 1.1 (ref 0.8–1.2)
Prothrombin Time: 14.6 seconds (ref 11.4–15.2)

## 2021-07-07 LAB — HEPARIN LEVEL (UNFRACTIONATED)
Heparin Unfractionated: 0.24 IU/mL — ABNORMAL LOW (ref 0.30–0.70)
Heparin Unfractionated: 0.42 IU/mL (ref 0.30–0.70)

## 2021-07-07 LAB — MAGNESIUM: Magnesium: 2 mg/dL (ref 1.7–2.4)

## 2021-07-07 MED ORDER — POTASSIUM CHLORIDE 20 MEQ PO PACK: 40.0000 meq | PACK | Freq: Two times a day (BID) | ORAL | Status: DC

## 2021-07-07 MED ORDER — POTASSIUM CHLORIDE 20 MEQ PO PACK
40.0000 meq | PACK | Freq: Once | ORAL | Status: AC
  Administered 2021-07-07: 40 meq via ORAL
  Filled 2021-07-07: qty 2

## 2021-07-07 MED ORDER — OXYCODONE HCL 5 MG PO TABS: 5.0000 mg | ORAL_TABLET | Freq: Four times a day (QID) | ORAL | 0 refills | Status: AC | PRN

## 2021-07-07 MED ORDER — WARFARIN SODIUM 7.5 MG PO TABS
9.5000 mg | ORAL_TABLET | Freq: Once | ORAL | Status: DC
  Filled 2021-07-07: qty 1

## 2021-07-07 MED ORDER — WARFARIN SODIUM 7.5 MG PO TABS
9.5000 mg | ORAL_TABLET | Freq: Once | ORAL | Status: AC
  Administered 2021-07-07: 9.5 mg via ORAL
  Filled 2021-07-07: qty 1

## 2021-07-07 MED ORDER — WARFARIN SODIUM 7.5 MG PO TABS
7.5000 mg | ORAL_TABLET | Freq: Once | ORAL | Status: DC
  Filled 2021-07-07: qty 1

## 2021-07-07 MED ORDER — WARFARIN - PHARMACIST DOSING INPATIENT: Freq: Every day | Status: DC

## 2021-07-07 NOTE — Plan of Care (Signed)
  Problem: Education: Goal: Knowledge of General Education information will improve Description: Including pain rating scale, medication(s)/side effects and non-pharmacologic comfort measures 07/07/2021 1256 by Conley Simmonds, RN Outcome: Adequate for Discharge 07/07/2021 0756 by Conley Simmonds, RN Outcome: Progressing   Problem: Health Behavior/Discharge Planning: Goal: Ability to manage health-related needs will improve 07/07/2021 1256 by Conley Simmonds, RN Outcome: Adequate for Discharge 07/07/2021 0756 by Conley Simmonds, RN Outcome: Progressing   Problem: Clinical Measurements: Goal: Ability to maintain clinical measurements within normal limits will improve 07/07/2021 1256 by Conley Simmonds, RN Outcome: Adequate for Discharge 07/07/2021 0756 by Conley Simmonds, RN Outcome: Progressing Goal: Will remain free from infection 07/07/2021 1256 by Conley Simmonds, RN Outcome: Adequate for Discharge 07/07/2021 0756 by Conley Simmonds, RN Outcome: Progressing Goal: Diagnostic test results will improve 07/07/2021 1256 by Conley Simmonds, RN Outcome: Adequate for Discharge 07/07/2021 0756 by Conley Simmonds, RN Outcome: Progressing Goal: Respiratory complications will improve 07/07/2021 1256 by Conley Simmonds, RN Outcome: Adequate for Discharge 07/07/2021 0756 by Conley Simmonds, RN Outcome: Progressing Goal: Cardiovascular complication will be avoided 07/07/2021 1256 by Conley Simmonds, RN Outcome: Adequate for Discharge 07/07/2021 0756 by Conley Simmonds, RN Outcome: Progressing   Problem: Activity: Goal: Risk for activity intolerance will decrease 07/07/2021 1256 by Conley Simmonds, RN Outcome: Adequate for Discharge 07/07/2021 0756 by Conley Simmonds, RN Outcome: Progressing   Problem: Nutrition: Goal: Adequate nutrition will be maintained 07/07/2021 1256 by Conley Simmonds, RN Outcome: Adequate for Discharge 07/07/2021 0756 by  Conley Simmonds, RN Outcome: Progressing   Problem: Coping: Goal: Level of anxiety will decrease 07/07/2021 1256 by Conley Simmonds, RN Outcome: Adequate for Discharge 07/07/2021 0756 by Conley Simmonds, RN Outcome: Progressing   Problem: Elimination: Goal: Will not experience complications related to bowel motility 07/07/2021 1256 by Conley Simmonds, RN Outcome: Adequate for Discharge 07/07/2021 0756 by Conley Simmonds, RN Outcome: Progressing Goal: Will not experience complications related to urinary retention 07/07/2021 1256 by Conley Simmonds, RN Outcome: Adequate for Discharge 07/07/2021 0756 by Conley Simmonds, RN Outcome: Progressing   Problem: Pain Managment: Goal: General experience of comfort will improve 07/07/2021 1256 by Conley Simmonds, RN Outcome: Adequate for Discharge 07/07/2021 0756 by Conley Simmonds, RN Outcome: Progressing   Problem: Safety: Goal: Ability to remain free from injury will improve 07/07/2021 1256 by Conley Simmonds, RN Outcome: Adequate for Discharge 07/07/2021 0756 by Conley Simmonds, RN Outcome: Progressing   Problem: Skin Integrity: Goal: Risk for impaired skin integrity will decrease 07/07/2021 1256 by Conley Simmonds, RN Outcome: Adequate for Discharge 07/07/2021 0756 by Conley Simmonds, RN Outcome: Progressing

## 2021-07-07 NOTE — Progress Notes (Signed)
NAME:  Gloria Hamilton, MRN:  [id number], DOB:  03-05-1996, LOS: 3 ADMISSION DATE:  07/04/2021, CONSULTATION DATE:  07/04/21 REFERRING MD:  Lynelle Doctor - EM, CHIEF COMPLAINT:  Abdominal pain : bleeding ovarian cyst    History of Present Illness:  25 yo F PMH congenital aortic insufficieny, aortic root aneurysm s/p repair and mechanical aortic valve replacement ([facility] -- Dr. Kizzie Bane, 2021) on coumadin, presented to ED 9/22 with abdominal pain, n/v/d which began 9/21 evening. OTC medications were not helpful, pain persisted and patient felt lightheaded prompting ED presentation. In ED, SBP 80s.  Hgb 9.8 from 14. CTA c/a/p revealed hemoperitoneum, acute hemorrhage surrounding uterus and ovaries.  In ED, emergency release blood ordered, Vit K given, Kcentra ordered. OB called, rec watchful waiting. Cards consulted for anticoag management. PCCM consulted for admission    Pertinent  Medical History  Congenital aortic insufficiency s/p mechanical replacement  Chronic anticoagulation  Migraines  Frequent UTI   Significant Hospital Events: Including procedures, antibiotic start and stop dates in addition to other pertinent events   9/22 ED for abdominal pain, found to have bleeding ovarian cyst. Getting blood Vit K Kcentra. OB Cards PCCM consulted. 1 U PRBC given 9/23 H/H remained stable, no acute need for surgery 9/24 Heparin challenge started  Interim History / Subjective:   Heparin started yesterday and H/H has remained stable without any increase in abdominal pain. She continues to tolerate PO intake. No bowel movement yet. Awaiting AM H/H check.  Objective   Blood pressure 95/66, pulse 81, temperature 98.4 F (36.9 C), temperature source Oral, resp. rate (!) 23, height 5\' 3"  (1.6 m), weight 65 kg, last menstrual period 06/20/2021, SpO2 97 %.        Intake/Output Summary (Last 24 hours) at 07/07/2021 0954 Last data filed at 07/07/2021 0900 Gross per 24 hour  Intake 555.28 ml  Output 2900 ml  Net  -2344.72 ml   Filed Weights   07/04/21 1635 07/05/21 0500 07/06/21 0441  Weight: 63.9 kg 65 kg 65 kg    Examination: General:  sitting up in bed, NAD HEENT: MM pink/moist, anicteric, atraumatic Neuro: alert and oriented, moving all extremities CV: rrr, +murmur, +Click,  PULM:  normal breath sounds, no wheezing GI: soft, bsx4 active, mildly distended Extremities: warm/dry, no edema  Skin: no rashes or lesions   Resolved Hospital Problem list   Shock  Lactic Acidosis  Assessment & Plan:   Acute blood loss anemia- post ovarian cyst rupture Shock has resolved with resolution of lactic acidosis -OB consulted- no surgical intervention for now, will follow up outpatient. -Plan to transfuse for HGB less than 7 or if acutely unstable -has tolerated heparin challenge well, follow up AM cbc  S/p mechanical aortic valve replacement on chronic coumadin Hx congenital aortic insufficiency, aortic root aneurysm  Had INR reversal upon admission for INR of 3.2. INR goal is 1.5-2.5 for mechanical valve.  [facility] on 05/22 with EF of 58%. p -Cards consulted  -hep gtt challenge started 9/24. No significant changes in H/H and clinical status. Will follow up AM H/H but will likely discharge patient home to resume her coumadin.  Best Practice (right click and "Reselect all SmartList Selections" daily)   Diet/type: Regular consistency (see orders) DVT prophylaxis: SCD GI prophylaxis: N/A Lines: N/A Foley:  N/A Code Status:  full code Last date of multidisciplinary goals of care discussion [9/22 - OB, CCM, Cards ]   Critical care time:  n/a    07-24[birth year]1977,  MD [facility] Office: [phone]   See Amion for personal pager PCCM on call pager [phone] until 7pm. Please call Elink 7p-7a. [phone]

## 2021-07-07 NOTE — Plan of Care (Signed)

## 2021-07-07 NOTE — Progress Notes (Signed)
Patient discharged with boyfriend and mother to home.  All of the discharge paperwork was reviewed and the patient had no questions. No distress noted at this time.

## 2021-07-07 NOTE — Progress Notes (Signed)
Subjective: Patient reports tolerating PO, + flatus, and no problems voiding.  Pain is controlled but present especially with movement but she is ambulatory about her room. She is taking her POP from her own supply.   Objective: I have reviewed patient's vital signs, medications, and labs.  BP 101/67   Pulse 81   Temp 98.4 F (36.9 C) (Oral)   Resp 18   Ht 5\' 3"  (1.6 m)   Wt 65 kg   LMP 06/20/2021   SpO2 96%   BMI 25.38 kg/m   I/O last 3 completed shifts: In: 844.3 [P.O.:630; I.V.:194.3; Other:20] Out: 4975 [Urine:4975] Total I/O In: 141.9 [P.O.:100; I.V.:41.9] Out: -    General: fatigued, and no distress Resp: clear to auscultation bilaterally Cardio: regular rate and rhythm GI: normal findings: bowel sounds normal and abnormal findings:  distended Extremities: extremities normal, atraumatic, no cyanosis or edema Vaginal Bleeding: none  CBC Latest Ref Rng & Units 07/07/2021 07/06/2021 07/06/2021  WBC 4.0 - 10.5 K/uL 5.1 6.3 6.0  Hemoglobin 12.0 - 15.0 g/dL 10.3(L) 11.0(L) 9.6(L)  Hematocrit 36.0 - 46.0 % 30.7(L) 33.0(L) 28.3(L)  Platelets 150 - 400 K/uL 121(L) 134(L) 119(L)     Assessment/Plan: Presumed hemorrhagic cyst - Stable, no indication for surgery at this time.  - Discussed expectations for pain resolution with reabsorption of blood - Reviewed impact on bowel movement. - Discussed possible risk of recurrence  Birth control - Discussed birth control in context of risk of recurrence of hemorrhagic cyst. She is currently on POP - she doesn't have a regular gynecologist. Offered our service. She currently plans to get her care through PCP - Discussed LARC options as well as Depo. Could do IM or Gonvick Depo. Discussed Lng-IUD in depth and risks associated as well as benefits. She will consider her options. Based on conversation, I suspect will likely continue POP.   We will sign off, but if additional questions, please call [facility] Second Attending.    LOS: 3 days     [facility] 07/07/2021, 11:15 AM

## 2021-07-07 NOTE — Discharge Instructions (Signed)
Information on my medicine - Coumadin   (Warfarin)  This medication education was reviewed with me or my healthcare representative as part of my discharge preparation.    Why was Coumadin prescribed for you? Coumadin was prescribed for you because you have a blood clot or a medical condition that can cause an increased risk of forming blood clots. Blood clots can cause serious health problems by blocking the flow of blood to the heart, lung, or brain. Coumadin can prevent harmful blood clots from forming. As a reminder your indication for Coumadin is:  Blood Clot Prevention after Heart Valve Surgery  What test will check on my response to Coumadin? While on Coumadin (warfarin) you will need to have an INR test regularly to ensure that your dose is keeping you in the desired range. The INR (international normalized ratio) number is calculated from the result of the laboratory test called prothrombin time (PT).  If an INR APPOINTMENT HAS NOT ALREADY BEEN MADE FOR YOU please schedule an appointment to have this lab work done by your health care provider within 7 days. Your INR goal is usually a number between:  2 to 3 or your provider may give you a more narrow range like 2-2.5.  Ask your health care provider during an office visit what your goal INR is.  What  do you need to  know  About  COUMADIN? Take Coumadin (warfarin) exactly as prescribed by your healthcare provider about the same time each day.  DO NOT stop taking without talking to the doctor who prescribed the medication.  Stopping without other blood clot prevention medication to take the place of Coumadin may increase your risk of developing a new clot or stroke.  Get refills before you run out.  What do you do if you miss a dose? If you miss a dose, take it as soon as you remember on the same day then continue your regularly scheduled regimen the next day.  Do not take two doses of Coumadin at the same time.  Important Safety  Information A possible side effect of Coumadin (Warfarin) is an increased risk of bleeding. You should call your healthcare provider right away if you experience any of the following: Bleeding from an injury or your nose that does not stop. Unusual colored urine (red or dark brown) or unusual colored stools (red or black). Unusual bruising for unknown reasons. A serious fall or if you hit your head (even if there is no bleeding).  Some foods or medicines interact with Coumadin (warfarin) and might alter your response to warfarin. To help avoid this: Eat a balanced diet, maintaining a consistent amount of Vitamin K. Notify your provider about major diet changes you plan to make. Avoid alcohol or limit your intake to 1 drink for women and 2 drinks for men per day. (1 drink is 5 oz. wine, 12 oz. beer, or 1.5 oz. liquor.)  Make sure that ANY health care provider who prescribes medication for you knows that you are taking Coumadin (warfarin).  Also make sure the healthcare provider who is monitoring your Coumadin knows when you have started a new medication including herbals and non-prescription products.  Coumadin (Warfarin)  Major Drug Interactions  Increased Warfarin Effect Decreased Warfarin Effect  Alcohol (large quantities) Antibiotics (esp. Septra/Bactrim, Flagyl, Cipro) Amiodarone (Cordarone) Aspirin (ASA) Cimetidine (Tagamet) Megestrol (Megace) NSAIDs (ibuprofen, naproxen, etc.) Piroxicam (Feldene) Propafenone (Rythmol SR) Propranolol (Inderal) Isoniazid (INH) Posaconazole (Noxafil) Barbiturates (Phenobarbital) Carbamazepine (Tegretol) Chlordiazepoxide (Librium) Cholestyramine (Questran) Griseofulvin   Oral Contraceptives Rifampin Sucralfate (Carafate) Vitamin K   Coumadin (Warfarin) Major Herbal Interactions  Increased Warfarin Effect Decreased Warfarin Effect  Garlic Ginseng Ginkgo biloba Coenzyme Q10 Green tea St. John's wort    Coumadin (Warfarin) FOOD  Interactions  Eat a consistent number of servings per week of foods HIGH in Vitamin K (1 serving =  cup)  Collards (cooked, or boiled & drained) Kale (cooked, or boiled & drained) Mustard greens (cooked, or boiled & drained) Parsley *serving size only =  cup Spinach (cooked, or boiled & drained) Swiss chard (cooked, or boiled & drained) Turnip greens (cooked, or boiled & drained)  Eat a consistent number of servings per week of foods MEDIUM-HIGH in Vitamin K (1 serving = 1 cup)  Asparagus (cooked, or boiled & drained) Broccoli (cooked, boiled & drained, or raw & chopped) Brussel sprouts (cooked, or boiled & drained) *serving size only =  cup Lettuce, raw (green leaf, endive, romaine) Spinach, raw Turnip greens, raw & chopped   These websites have more information on Coumadin (warfarin):  www.coumadin.com; www.ahrq.gov/consumer/coumadin.htm;   

## 2021-07-07 NOTE — Progress Notes (Signed)
Cardiology Progress Note  Patient ID: Gloria Hamilton MRN: [id number] DOB: 12-02-1995 Date of Encounter: 07/07/2021  Primary Cardiologist: Meriam Sprague, MD  Subjective   Abdomen feels well Hct stable on heparin  She follows at our coumadin clinic Normal dose fairly high 9.5 mg 5 days/wee and 7.5 mg 2 days/week   ROS:  All other ROS reviewed and negative. Pertinent positives noted in the HPI.     Inpatient Medications  Scheduled Meds:  Chlorhexidine Gluconate Cloth  6 each Topical Daily   lidocaine  1 patch Transdermal Q24H   senna  2 tablet Oral BID   Continuous Infusions:  heparin 1,050 Units/hr (07/07/21 1000)    PRN Meds: acetaminophen, ondansetron (ZOFRAN) IV, oxyCODONE, polyethylene glycol   Vital Signs   Vitals:   07/07/21 0723 07/07/21 0800 07/07/21 0900 07/07/21 1000  BP:  93/67 95/66 101/67  Pulse:  84 81 81  Resp:  (!) 23 (!) 23 18  Temp: 98.4 F (36.9 C)     TempSrc: Oral     SpO2:  97% 97% 96%  Weight:      Height:        Intake/Output Summary (Last 24 hours) at 07/07/2021 1056 Last data filed at 07/07/2021 1000 Gross per 24 hour  Intake 565.76 ml  Output 2900 ml  Net -2334.24 ml   Last 3 Weights 07/06/2021 07/05/2021 07/04/2021  Weight (lbs) 143 lb 4.8 oz 143 lb 4.8 oz 140 lb 14 oz  Weight (kg) 65 kg 65 kg 63.9 kg      Telemetry  Overnight telemetry shows sinus rhythm in the 80s, which I personally reviewed.   Physical Exam   Vitals:   07/07/21 0723 07/07/21 0800 07/07/21 0900 07/07/21 1000  BP:  93/67 95/66 101/67  Pulse:  84 81 81  Resp:  (!) 23 (!) 23 18  Temp: 98.4 F (36.9 C)     TempSrc: Oral     SpO2:  97% 97% 96%  Weight:      Height:        Intake/Output Summary (Last 24 hours) at 07/07/2021 1056 Last data filed at 07/07/2021 1000 Gross per 24 hour  Intake 565.76 ml  Output 2900 ml  Net -2334.24 ml    Last 3 Weights 07/06/2021 07/05/2021 07/04/2021  Weight (lbs) 143 lb 4.8 oz 143 lb 4.8 oz 140 lb 14 oz  Weight (kg) 65  kg 65 kg 63.9 kg    Body mass index is 25.38 kg/m.   Post sternotomy Pale SEM through AVR no AR good valve clicks BS positive  No edema  Labs  High Sensitivity Troponin:  No results for input(s): TROPONINIHS in the last 720 hours.   Cardiac EnzymesNo results for input(s): TROPONINI in the last 168 hours. No results for input(s): TROPIPOC in the last 168 hours.  Chemistry Recent Labs  Lab 07/04/21 1135 07/05/21 1223 07/06/21 0210  NA 137 137 137  K 3.8 3.2* 3.6  CL 108 109 109  CO2 26 25 24   GLUCOSE 133* 90 112*  BUN 8 <5* <5*  CREATININE 0.78 0.57 0.56  CALCIUM 7.6* 7.3* 7.7*  PROT 4.8*  --   --   ALBUMIN 3.4*  --   --   AST 16  --   --   ALT 10  --   --   ALKPHOS 33*  --   --   BILITOT 0.8  --   --   GFRNONAA >60 >60 >60  ANIONGAP 3* 3* 4*  Hematology Recent Labs  Lab 07/06/21 0210 07/06/21 1619 07/07/21 0057  WBC 6.0 6.3 5.1  RBC 3.11* 3.60* 3.37*  HGB 9.6* 11.0* 10.3*  HCT 28.3* 33.0* 30.7*  MCV 91.0 91.7 91.1  MCH 30.9 30.6 30.6  MCHC 33.9 33.3 33.6  RDW 15.9* 15.3 15.2  PLT 119* 134* 121*   BNPNo results for input(s): BNP, PROBNP in the last 168 hours.  DDimer No results for input(s): DDIMER in the last 168 hours.   Radiology  No results found.  Cardiac Studies  MRI 03/07/2021 1. The left ventricle is normal in cavity size and wall thickness. Global systolic function is normal with  an LV ejection fraction calculated at 58%. There are no regional wall motion abnormalities.   2. The right ventricle is normal in cavity size, wall thickness, and systolic function.   3. The right atrium is mildly enlarged. The left atrium is normal in size.   4. There is a mechanical prosthetic valve in the aortic position. The leaflets are not well visualized due  to metal artifacts. However, the valve appears to be well-seated and leaflets motion appear to be normal.  Peak aortic flow velocity is 1.2 m/s. There is trivial aortic regurgitation. These findings  are consistent  with a normal functioning prosthetic valve.   5. There is moderate pulmonic regurgitation. There is also mild tricuspid regurgitation. There are no other  significant valvular lesions.   6. Delayed enhancement imaging demonstrates no evidence of myocardial infarction, scar or infiltrative  disease.   7.  No intracardiac thrombus visualized.   Patient Profile  Gloria Hamilton is a 25 y.o. female with congenital bicuspid aortic valve (status post Ross procedure with failed aortic prosthesis status post a sending aortic repair with mechanical aortic valve replacement 05/17/2020) who was admitted on 07/04/2021 for hemorrhagic shock secondary to a ruptured ovarian cyst.  Cardiology was consulted for anticoagulation management in the setting of hemorrhaging.  Assessment & Plan   Hemorrhagic Shock:  ovarian cyst rupture No plans for surgery Hb stable Has ON- X AVR requires low level anticoagulation 1.5-2.5 Normal function and exam Tolerating heparin Start coumadin today will arrange f/u in our coumadin clinic mid/end of next week   For questions or updates, please contact [facility] Please consult [email or web address] for contact info under   Charlton Haws MD [facility] Patient ID: Gloria Hamilton, female   DOB: December 15, 1995, 25 y.o.   MRN: [id number]

## 2021-07-07 NOTE — Progress Notes (Signed)
ANTICOAGULATION CONSULT NOTE  Pharmacy Consult for IV Heparin Indication:  aortic valve replacement  Allergies  Allergen Reactions   Ceftriaxone Rash    Lower extremity edema   Ciprofloxacin Other (See Comments)    "symptoms more elevated so tries to avoid"    Lisinopril Cough    Patient Measurements: Height: 5\' 3"  (160 cm) Weight: 65 kg (143 lb 4.8 oz) IBW/kg (Calculated) : 52.4 Heparin Dosing Weight: 65 kg  Vital Signs: Temp: 101.4 F (38.6 C) (09/25 0046) Temp Source: Oral (09/25 0046) BP: 99/70 (09/25 0100) Pulse Rate: 93 (09/25 0100)  Labs: Recent Labs    07/04/21 1135 07/04/21 1143 07/05/21 0609 07/05/21 1223 07/05/21 1640 07/06/21 0210 07/06/21 1619 07/07/21 0051 07/07/21 0057  HGB 9.8*   < > 10.3* 9.9*   < > 9.6* 11.0*  --  10.3*  HCT 30.4*   < > 29.2* 29.7*   < > 28.3* 33.0*  --  30.7*  PLT 168   < > 117* 126*   < > 119* 134*  --  121*  LABPROT  --    < > 15.5*  --   --  15.2  --  14.6  --   INR  --    < > 1.2  --   --  1.2  --  1.1  --   HEPARINUNFRC  --   --   --   --   --   --  0.19* 0.24*  --   CREATININE 0.78  --   --  0.57  --  0.56  --   --   --    < > = values in this interval not displayed.     Estimated Creatinine Clearance: 97.4 mL/min (by C-G formula based on SCr of 0.56 mg/dL).   Medical History: Past Medical History:  Diagnosis Date   Anxiety    Bicuspid aortic valve    CHF (congestive heart failure) (HCC)    Congenital aortic insufficiency    H/O aortic valve replacement    Heart murmur    Migraines    Transposition of great vessels     Medications:  Medications Prior to Admission  Medication Sig Dispense Refill Last Dose   acetaminophen (TYLENOL) 325 MG tablet 1 tablet as needed   last month   aspirin 81 MG chewable tablet Chew 81 mg by mouth daily.   07/04/2021 at 0800   metoprolol succinate (TOPROL XL) 25 MG 24 hr tablet Take 0.5 tablets (12.5 mg total) by mouth every evening. 45 tablet 3 07/03/2021 at 2345    norethindrone (CAMILA) 0.35 MG tablet Take 1 tablet (0.35 mg total) by mouth daily. 1 Package 5 07/04/2021 at 0800   warfarin (COUMADIN) 1 MG tablet Take 2 tablets (2mg ) by mouth daily along with your warfarin 7.5mg  tablet  as instructed by the Anticoagulation Clinic. (Patient taking differently: Take 2 mg by mouth as directed. Take 2 tablets (2mg ) by mouth daily along with Warfarin 7.5mg  tablet on (Sundays, Mondays, Wednesdays, Thursdays, Fridays) as instructed by the Anticoagulation Clinic.) 45 tablet 3 07/03/2021 at 2345   warfarin (COUMADIN) 7.5 MG tablet Take 1 tablet (7.5mg ) by mouth daily as instructed by the coumadin clinic. (Patient taking differently: Take 7.5 mg by mouth as directed. Take 1 tablet (7.5 mg) by mouth daily along with 2 tablets (2 mg) on (Sundays, Mondays, Wednesdays, Thursdays, Fridays) & Take 1 tablet (7.5 mg) by mouth daily on (Tues & Saturday) as instructed by the Anticoagulation Clinic.) 35  tablet 1 07/03/2021 at 2345   nitrofurantoin, macrocrystal-monohydrate, (MACROBID) 100 MG capsule Take 1 capsule (100 mg total) by mouth 2 (two) times daily. 10 capsule 0 06/30/2021 at 1500    Assessment: 25 years of age female with history of bicuspid aortic valve status post mechanical On-X aortic valve replacement on Warfarin prior to admission who was admitted with abdominal pain and found to have a ruptured ovarian cyst with hemoperitoneum and hemorrhagic shock. INR 3.2 on presentation and hemoglobin 9.8. Patient was given blood, vitamin K, and Kcentra emergently in ED on 9/22. No plans for surgery. Pharmacy consulted 9/24 AM to resume anticoagulation with IV Heparin for anticoagulation of the valve.   Initial heparin level came back subtherapeutic at 0.19, on 800 units/hr. Hgb 11, plt 134. No s/sx of bleeding or infusion issues.   9/25 AM update:  Heparin level low but trending up Hgb 10.3  Goal of Therapy:  Heparin level 0.3-0.7 units/ml INR 1.5 to 2.5 (valve) Monitor platelets  by anticoagulation protocol: Yes   Plan:  Inc heparin to 1050 units/hr No bolus with recent bleed and hemorrhagic shock.  Heparin level in 6-9 hours. Daily Heparin level and CBC while on therapy.   Abran Duke, PharmD, BCPS Clinical Pharmacist Phone: [phone]

## 2021-07-07 NOTE — Discharge Summary (Signed)
Physician Discharge Summary  Patient ID: Gloria Hamilton MRN: [id number] DOB/AGE: Sep 28, 1996 25 y.o.  Admit date: 07/04/2021 Discharge date: 07/07/2021  Problem List Active Problems:   Hemorrhagic shock (HCC)   Anticoagulated on Coumadin  HPI: 25 yo F PMH congenital aortic insufficieny, aortic root aneurysm s/p repair and mechanical aortic valve replacement ([facility] -- Dr. Kizzie Bane, 2021) on coumadin, presented to ED 9/22 with abdominal pain, n/v/d which began 9/21 evening. OTC medications were not helpful, pain persisted and patient felt lightheaded prompting ED presentation. In ED, SBP 80s.  Hgb 9.8 from 14. CTA c/a/p revealed hemoperitoneum, acute hemorrhage surrounding uterus and ovaries.  Hospital Course: Her anticoagulation was reversed in the ER with vitamin K and Kcentra.  She was monitored for 24 hours after admission and was provided 2 units PRBCs.  Her hemoglobin remained stable on 9/23. Cardiology was consulted for management of anticoagulation and she was started on heparin drip on 9/24 with no increase in abdominal pain or hypotension with a slight decline in her hemoglobin level.  Cardiology has recommended that she be restarted on her Coumadin with close follow-up in the Coumadin clinic.  He did not recommend Lovenox bridge due to her On-X valve and normal ejection fraction.  OB/GYN followed the patient throughout admission and did not require surgery as the hemorrhage resolved spontaneously.     Labs at discharge Lab Results  Component Value Date   CREATININE 0.56 07/07/2021   BUN <5 (L) 07/07/2021   NA 137 07/07/2021   K 3.4 (L) 07/07/2021   CL 106 07/07/2021   CO2 25 07/07/2021   Lab Results  Component Value Date   WBC 3.6 (L) 07/07/2021   HGB 10.0 (L) 07/07/2021   HCT 29.5 (L) 07/07/2021   MCV 91.0 07/07/2021   PLT 121 (L) 07/07/2021   Lab Results  Component Value Date   ALT 10 07/04/2021   AST 16 07/04/2021   ALKPHOS 33 (L) 07/04/2021   BILITOT 0.8 07/04/2021    Lab Results  Component Value Date   INR 1.1 07/07/2021   INR 1.2 07/06/2021   INR 1.2 07/05/2021    Current radiology studies CTA Chest/Abd/Pelvis 07/04/21 Chest: Postop changes of the ascending thoracic aorta and aortic valve replacement. Left aortic arch with an aberrant right subclavian artery, normal variant.   No acute intrathoracic vascular or non vascular finding.   Abdomen and pelvis: Moderate acute pelvic hematoma surrounding the uterus and adnexa with associated moderate hemoperitoneum extending superiorly throughout the abdomen. Suspect ruptured hemorrhagic/bleeding right ovarian lesion/cyst. Focus of mild active bleeding or contrast extravasation noted within the right hemipelvis hematoma, as above.  Disposition:  Discharge home   Allergies as of 07/07/2021       Reactions   Ceftriaxone Rash   Lower extremity edema   Ciprofloxacin Other (See Comments)   "symptoms more elevated so tries to avoid"    Lisinopril Cough        Medication List     TAKE these medications    acetaminophen 325 MG tablet Commonly known as: TYLENOL 1 tablet as needed   aspirin 81 MG chewable tablet Chew 81 mg by mouth daily.   metoprolol succinate 25 MG 24 hr tablet Commonly known as: Toprol XL Take 0.5 tablets (12.5 mg total) by mouth every evening.   nitrofurantoin (macrocrystal-monohydrate) 100 MG capsule Commonly known as: MACROBID Take 1 capsule (100 mg total) by mouth 2 (two) times daily.   norethindrone 0.35 MG tablet Commonly known as: Camila Take 1 tablet (  0.35 mg total) by mouth daily.   oxyCODONE 5 MG immediate release tablet Commonly known as: Oxy IR/ROXICODONE Take 1 tablet (5 mg total) by mouth every 6 (six) hours as needed for up to 3 days for severe pain.   warfarin 1 MG tablet Commonly known as: COUMADIN Take as directed. If you are unsure how to take this medication, talk to your nurse or doctor. Original instructions: Take 2 tablets (2mg ) by  mouth daily along with your warfarin 7.5mg  tablet  as instructed by the Anticoagulation Clinic. What changed:  how much to take how to take this when to take this additional instructions   warfarin 7.5 MG tablet Commonly known as: COUMADIN Take as directed. If you are unsure how to take this medication, talk to your nurse or doctor. Original instructions: Take 1 tablet (7.5mg ) by mouth daily as instructed by the coumadin clinic. What changed:  how much to take how to take this when to take this additional instructions         Discharged Condition: good  Vital signs at Discharge. Temp:  [97.4 F (36.3 C)-101.4 F (38.6 C)] 98.5 F (36.9 C) (09/25 1120) Pulse Rate:  [81-105] 81 (09/25 1000) Resp:  [15-25] 18 (09/25 1000) BP: (89-101)/(58-71) 101/67 (09/25 1000) SpO2:  [93 %-99 %] 96 % (09/25 1000)  Examination: General:  sitting up in bed, NAD HEENT: MM pink/moist, anicteric, atraumatic Neuro: alert and oriented, moving all extremities CV: rrr, +murmur, +Click,  PULM:  normal breath sounds, no wheezing GI: soft, bsx4 active, mildly distended Extremities: warm/dry, no edema  Skin: no rashes or lesions  Office follow up Special Information or instructions. - She is to follow up with her PCP for hospital follow up - She is to take miralax PRN for constipation - She is to take oxycodone 5mg  as needed every 4 hours by mouth for abdominal discomfort.  Signed: 06-06-1972, MD [facility] Office: [phone]   See Amion for personal pager PCCM on call pager [phone] until 7pm. Please call Elink 7p-7a. [phone]

## 2021-07-07 NOTE — Progress Notes (Addendum)
ANTICOAGULATION CONSULT NOTE  Pharmacy Consult for IV Heparin + Warfarin Indication:  aortic valve replacement  Allergies  Allergen Reactions   Ceftriaxone Rash    Lower extremity edema   Ciprofloxacin Other (See Comments)    "symptoms more elevated so tries to avoid"    Lisinopril Cough    Patient Measurements: Height: 5\' 3"  (160 cm) Weight: 65 kg (143 lb 4.8 oz) IBW/kg (Calculated) : 52.4 Heparin Dosing Weight: 65 kg  Vital Signs: Temp: 98.4 F (36.9 C) (09/25 0723) Temp Source: Oral (09/25 0723) BP: 101/67 (09/25 1000) Pulse Rate: 81 (09/25 1000)  Labs: Recent Labs    07/04/21 1135 07/04/21 1143 07/05/21 0609 07/05/21 1223 07/05/21 1640 07/06/21 0210 07/06/21 1619 07/07/21 0051 07/07/21 0057 07/07/21 1039  HGB 9.8*   < > 10.3* 9.9*   < > 9.6* 11.0*  --  10.3* 10.0*  HCT 30.4*   < > 29.2* 29.7*   < > 28.3* 33.0*  --  30.7* 29.5*  PLT 168   < > 117* 126*   < > 119* 134*  --  121* 121*  LABPROT  --    < > 15.5*  --   --  15.2  --  14.6  --   --   INR  --    < > 1.2  --   --  1.2  --  1.1  --   --   HEPARINUNFRC  --   --   --   --   --   --  0.19* 0.24*  --  0.42  CREATININE 0.78  --   --  0.57  --  0.56  --   --   --   --    < > = values in this interval not displayed.    Estimated Creatinine Clearance: 97.4 mL/min (by C-G formula based on SCr of 0.56 mg/dL).   Medical History: Past Medical History:  Diagnosis Date   Anxiety    Bicuspid aortic valve    CHF (congestive heart failure) (HCC)    Congenital aortic insufficiency    H/O aortic valve replacement    Heart murmur    Migraines    Transposition of great vessels     Medications:  Medications Prior to Admission  Medication Sig Dispense Refill Last Dose   acetaminophen (TYLENOL) 325 MG tablet 1 tablet as needed   last month   aspirin 81 MG chewable tablet Chew 81 mg by mouth daily.   07/04/2021 at 0800   metoprolol succinate (TOPROL XL) 25 MG 24 hr tablet Take 0.5 tablets (12.5 mg total) by  mouth every evening. 45 tablet 3 07/03/2021 at 2345   norethindrone (CAMILA) 0.35 MG tablet Take 1 tablet (0.35 mg total) by mouth daily. 1 Package 5 07/04/2021 at 0800   warfarin (COUMADIN) 1 MG tablet Take 2 tablets (2mg ) by mouth daily along with your warfarin 7.5mg  tablet  as instructed by the Anticoagulation Clinic. (Patient taking differently: Take 2 mg by mouth as directed. Take 2 tablets (2mg ) by mouth daily along with Warfarin 7.5mg  tablet on (Sundays, Mondays, Wednesdays, Thursdays, Fridays) as instructed by the Anticoagulation Clinic.) 45 tablet 3 07/03/2021 at 2345   warfarin (COUMADIN) 7.5 MG tablet Take 1 tablet (7.5mg ) by mouth daily as instructed by the coumadin clinic. (Patient taking differently: Take 7.5 mg by mouth as directed. Take 1 tablet (7.5 mg) by mouth daily along with 2 tablets (2 mg) on (Sundays, Mondays, Wednesdays, Thursdays, Fridays) & Take 1 tablet (  7.5 mg) by mouth daily on (Tues & Saturday) as instructed by the Anticoagulation Clinic.) 35 tablet 1 07/03/2021 at 2345   nitrofurantoin, macrocrystal-monohydrate, (MACROBID) 100 MG capsule Take 1 capsule (100 mg total) by mouth 2 (two) times daily. 10 capsule 0 06/30/2021 at 1500    Assessment: 25 years of age female with history of bicuspid aortic valve status post mechanical On-X aortic valve replacement on Warfarin prior to admission who was admitted with abdominal pain and found to have a ruptured ovarian cyst with hemoperitoneum and hemorrhagic shock. INR 3.2 on presentation and hemoglobin 9.8. Patient was given blood, vitamin K, and Kcentra emergently in ED on 9/22. No plans for surgery. Pharmacy consulted 9/24 AM to resume anticoagulation with IV Heparin for anticoagulation of the valve.   Heparin level 0.42 on 1050 units/hr.  Hgb 10, plt 121 (stable). INR 1.1 No s/sx of bleeding or infusion issues.   Home Warfarin regimen 7.5 mg on Tuesday and Saturday and 9.5 mg all other days.   Goal of Therapy:  Heparin level  0.3-0.7 units/ml INR 1.5 to 2.5 (valve) Monitor platelets by anticoagulation protocol: Yes   Plan:  Continue heparin to 1050 units/hr Restart Warfarin 9.5 mg (per home regimen). Daily INR, Heparin level, and CBC while on therapy.   Addendum:  Spoke with Dr. Francine Graven, CCM - plan is to stop IV heparin and send home on Warfarin. Reviewed patient's reversal with vitamin K may impact INR for a few days. No bridge per team (CCM and Cardiology) due to type of valve.   Link Snuffer, PharmD, BCPS, BCCCP Clinical Pharmacist Please refer to [facility] for [facility] numbers 07/07/2021, 11:35 AM

## 2021-07-08 DIAGNOSIS — F330 Major depressive disorder, recurrent, mild: Secondary | ICD-10-CM | POA: Diagnosis not present

## 2021-07-08 LAB — GC/CHLAMYDIA PROBE AMP (~~LOC~~) NOT AT ARMC
Chlamydia: NEGATIVE
Comment: NEGATIVE
Comment: NORMAL
Neisseria Gonorrhea: NEGATIVE

## 2021-07-09 ENCOUNTER — Other Ambulatory Visit: Payer: Self-pay

## 2021-07-09 ENCOUNTER — Ambulatory Visit (INDEPENDENT_AMBULATORY_CARE_PROVIDER_SITE_OTHER): Payer: BC Managed Care – PPO

## 2021-07-09 DIAGNOSIS — Z7901 Long term (current) use of anticoagulants: Secondary | ICD-10-CM | POA: Diagnosis not present

## 2021-07-09 DIAGNOSIS — Z5181 Encounter for therapeutic drug level monitoring: Secondary | ICD-10-CM

## 2021-07-09 DIAGNOSIS — Z952 Presence of prosthetic heart valve: Secondary | ICD-10-CM | POA: Diagnosis not present

## 2021-07-09 LAB — POCT INR: INR: 1.2 — AB (ref 2.0–3.0)

## 2021-07-09 NOTE — Patient Instructions (Signed)
Description   Take 9.5mg  today, then resume same dosage of warfarin 9.5mg  daily except for 7.5mg  on Tuesdays and Saturdays. Recheck INR in 1 week. [facility] [phone].

## 2021-07-15 DIAGNOSIS — F330 Major depressive disorder, recurrent, mild: Secondary | ICD-10-CM | POA: Diagnosis not present

## 2021-07-16 ENCOUNTER — Other Ambulatory Visit: Payer: Self-pay

## 2021-07-16 ENCOUNTER — Ambulatory Visit (INDEPENDENT_AMBULATORY_CARE_PROVIDER_SITE_OTHER): Payer: BC Managed Care – PPO

## 2021-07-16 DIAGNOSIS — Z5181 Encounter for therapeutic drug level monitoring: Secondary | ICD-10-CM

## 2021-07-16 DIAGNOSIS — Z952 Presence of prosthetic heart valve: Secondary | ICD-10-CM

## 2021-07-16 DIAGNOSIS — Z7901 Long term (current) use of anticoagulants: Secondary | ICD-10-CM | POA: Diagnosis not present

## 2021-07-16 LAB — POCT INR: INR: 2.5 (ref 2.0–3.0)

## 2021-07-16 NOTE — Patient Instructions (Signed)
Description   Continue on same dosage of warfarin 9.5mg  daily except for 7.5mg  on Tuesdays and Saturdays. Recheck INR in 2 weeks. [facility] [phone].

## 2021-07-22 ENCOUNTER — Other Ambulatory Visit: Payer: Self-pay

## 2021-07-22 MED ORDER — WARFARIN SODIUM 7.5 MG PO TABS: ORAL_TABLET | ORAL | 1 refills | Status: DC

## 2021-07-23 ENCOUNTER — Other Ambulatory Visit: Payer: Self-pay | Admitting: *Deleted

## 2021-07-23 MED ORDER — WARFARIN SODIUM 7.5 MG PO TABS: ORAL_TABLET | ORAL | 0 refills | Status: DC

## 2021-07-23 NOTE — Telephone Encounter (Signed)
Pt requests a 90 day supply of warfarin; she has been adhering to appts. Will send 90 day at this time.

## 2021-07-28 DIAGNOSIS — F330 Major depressive disorder, recurrent, mild: Secondary | ICD-10-CM | POA: Diagnosis not present

## 2021-07-30 ENCOUNTER — Other Ambulatory Visit: Payer: Self-pay

## 2021-07-30 ENCOUNTER — Ambulatory Visit (INDEPENDENT_AMBULATORY_CARE_PROVIDER_SITE_OTHER): Payer: BC Managed Care – PPO

## 2021-07-30 DIAGNOSIS — Z5181 Encounter for therapeutic drug level monitoring: Secondary | ICD-10-CM | POA: Diagnosis not present

## 2021-07-30 DIAGNOSIS — Z7901 Long term (current) use of anticoagulants: Secondary | ICD-10-CM

## 2021-07-30 DIAGNOSIS — Z952 Presence of prosthetic heart valve: Secondary | ICD-10-CM

## 2021-07-30 LAB — POCT INR: INR: 2.6 (ref 2.0–3.0)

## 2021-07-30 NOTE — Patient Instructions (Signed)
Description   Start taking Warfarin 9.5mg  daily except for 7.5mg  on Tuesdays, Thursdays and Saturdays. Recheck INR in 3 weeks. [facility] [phone].

## 2021-08-20 ENCOUNTER — Other Ambulatory Visit: Payer: Self-pay

## 2021-08-20 ENCOUNTER — Ambulatory Visit (INDEPENDENT_AMBULATORY_CARE_PROVIDER_SITE_OTHER): Payer: BC Managed Care – PPO

## 2021-08-20 DIAGNOSIS — Z5181 Encounter for therapeutic drug level monitoring: Secondary | ICD-10-CM

## 2021-08-20 DIAGNOSIS — Z7901 Long term (current) use of anticoagulants: Secondary | ICD-10-CM

## 2021-08-20 DIAGNOSIS — Z952 Presence of prosthetic heart valve: Secondary | ICD-10-CM | POA: Diagnosis not present

## 2021-08-20 LAB — POCT INR: INR: 3.4 — AB (ref 2.0–3.0)

## 2021-08-20 MED ORDER — WARFARIN SODIUM 1 MG PO TABS: ORAL_TABLET | ORAL | 3 refills | Status: DC

## 2021-08-20 NOTE — Patient Instructions (Signed)
Description   Skip today's dosage of Warfarin, then start taking Warfarin 7.5mg  daily except for 9.5mg  on Mondays and Fridays. Recheck INR in 2 weeks. [facility] [phone].

## 2021-09-03 ENCOUNTER — Other Ambulatory Visit: Payer: Self-pay

## 2021-09-03 ENCOUNTER — Ambulatory Visit (INDEPENDENT_AMBULATORY_CARE_PROVIDER_SITE_OTHER): Payer: BC Managed Care – PPO | Admitting: *Deleted

## 2021-09-03 DIAGNOSIS — Z7901 Long term (current) use of anticoagulants: Secondary | ICD-10-CM

## 2021-09-03 DIAGNOSIS — Z952 Presence of prosthetic heart valve: Secondary | ICD-10-CM | POA: Diagnosis not present

## 2021-09-03 DIAGNOSIS — Z5181 Encounter for therapeutic drug level monitoring: Secondary | ICD-10-CM

## 2021-09-03 LAB — POCT INR: INR: 2.1 (ref 2.0–3.0)

## 2021-09-03 NOTE — Patient Instructions (Addendum)
Description   Continue taking Warfarin 7.5mg  daily except for 9.5mg  on Mondays and Fridays. Recheck INR in 3 weeks. [facility] [phone].

## 2021-09-23 DIAGNOSIS — F330 Major depressive disorder, recurrent, mild: Secondary | ICD-10-CM | POA: Diagnosis not present

## 2021-09-24 ENCOUNTER — Other Ambulatory Visit: Payer: Self-pay

## 2021-09-24 ENCOUNTER — Ambulatory Visit (INDEPENDENT_AMBULATORY_CARE_PROVIDER_SITE_OTHER): Payer: BC Managed Care – PPO | Admitting: *Deleted

## 2021-09-24 DIAGNOSIS — Z952 Presence of prosthetic heart valve: Secondary | ICD-10-CM | POA: Diagnosis not present

## 2021-09-24 DIAGNOSIS — Z5181 Encounter for therapeutic drug level monitoring: Secondary | ICD-10-CM

## 2021-09-24 DIAGNOSIS — Z7901 Long term (current) use of anticoagulants: Secondary | ICD-10-CM

## 2021-09-24 LAB — POCT INR: INR: 1.9 — AB (ref 2.0–3.0)

## 2021-09-24 NOTE — Patient Instructions (Signed)
Description   Continue taking Warfarin 7.5mg  daily except for 9.5mg  on Mondays and Fridays. Recheck INR in 4 weeks. [facility] [phone].

## 2021-10-18 DIAGNOSIS — F330 Major depressive disorder, recurrent, mild: Secondary | ICD-10-CM | POA: Diagnosis not present

## 2021-10-23 ENCOUNTER — Ambulatory Visit (INDEPENDENT_AMBULATORY_CARE_PROVIDER_SITE_OTHER): Payer: BC Managed Care – PPO | Admitting: *Deleted

## 2021-10-23 ENCOUNTER — Other Ambulatory Visit: Payer: Self-pay

## 2021-10-23 DIAGNOSIS — Z7901 Long term (current) use of anticoagulants: Secondary | ICD-10-CM | POA: Diagnosis not present

## 2021-10-23 DIAGNOSIS — Z952 Presence of prosthetic heart valve: Secondary | ICD-10-CM

## 2021-10-23 DIAGNOSIS — Z5181 Encounter for therapeutic drug level monitoring: Secondary | ICD-10-CM | POA: Diagnosis not present

## 2021-10-23 LAB — POCT INR: INR: 2.1 (ref 2.0–3.0)

## 2021-10-23 MED ORDER — WARFARIN SODIUM 7.5 MG PO TABS: ORAL_TABLET | ORAL | 0 refills | Status: DC

## 2021-10-23 MED ORDER — WARFARIN SODIUM 1 MG PO TABS: ORAL_TABLET | ORAL | 0 refills | Status: DC

## 2021-10-23 NOTE — Patient Instructions (Signed)
Description   °Continue taking Warfarin 7.5mg daily except for 9.5mg on Mondays and Fridays. Recheck INR in 6 weeks. [facility] [phone].  °  °   °

## 2021-10-30 DIAGNOSIS — F330 Major depressive disorder, recurrent, mild: Secondary | ICD-10-CM | POA: Diagnosis not present

## 2021-10-31 DIAGNOSIS — N83209 Unspecified ovarian cyst, unspecified side: Secondary | ICD-10-CM | POA: Diagnosis not present

## 2021-10-31 DIAGNOSIS — R1084 Generalized abdominal pain: Secondary | ICD-10-CM | POA: Diagnosis not present

## 2021-11-12 DIAGNOSIS — F330 Major depressive disorder, recurrent, mild: Secondary | ICD-10-CM | POA: Diagnosis not present

## 2021-12-02 ENCOUNTER — Encounter: Payer: Self-pay | Admitting: Cardiology

## 2021-12-02 MED ORDER — METOPROLOL SUCCINATE ER 25 MG PO TB24: 12.5000 mg | ORAL_TABLET | Freq: Every evening | ORAL | 0 refills | Status: DC

## 2021-12-04 ENCOUNTER — Ambulatory Visit (INDEPENDENT_AMBULATORY_CARE_PROVIDER_SITE_OTHER): Payer: BC Managed Care – PPO

## 2021-12-04 ENCOUNTER — Other Ambulatory Visit: Payer: Self-pay

## 2021-12-04 DIAGNOSIS — Z5181 Encounter for therapeutic drug level monitoring: Secondary | ICD-10-CM

## 2021-12-04 DIAGNOSIS — Z7901 Long term (current) use of anticoagulants: Secondary | ICD-10-CM

## 2021-12-04 DIAGNOSIS — Z952 Presence of prosthetic heart valve: Secondary | ICD-10-CM

## 2021-12-04 LAB — POCT INR: INR: 2.4 (ref 2.0–3.0)

## 2021-12-04 NOTE — Patient Instructions (Signed)
Description   Continue taking Warfarin 7.5mg  daily except for 9.5mg  on Mondays and Fridays. Recheck INR in 6 weeks. [facility] [phone].

## 2021-12-10 DIAGNOSIS — F330 Major depressive disorder, recurrent, mild: Secondary | ICD-10-CM | POA: Diagnosis not present

## 2021-12-12 ENCOUNTER — Other Ambulatory Visit: Payer: Self-pay

## 2021-12-12 ENCOUNTER — Ambulatory Visit (HOSPITAL_COMMUNITY)
Admission: EM | Admit: 2021-12-12 | Discharge: 2021-12-12 | Disposition: A | Discharge status: Home / Self Care | Payer: BC Managed Care – PPO | Attending: Physician Assistant | Admitting: Physician Assistant

## 2021-12-12 ENCOUNTER — Encounter (HOSPITAL_COMMUNITY): Payer: Self-pay

## 2021-12-12 DIAGNOSIS — R350 Frequency of micturition: Secondary | ICD-10-CM | POA: Insufficient documentation

## 2021-12-12 DIAGNOSIS — Z7901 Long term (current) use of anticoagulants: Secondary | ICD-10-CM | POA: Insufficient documentation

## 2021-12-12 DIAGNOSIS — R42 Dizziness and giddiness: Secondary | ICD-10-CM | POA: Insufficient documentation

## 2021-12-12 DIAGNOSIS — R8290 Unspecified abnormal findings in urine: Secondary | ICD-10-CM | POA: Diagnosis not present

## 2021-12-12 LAB — CBC WITH DIFFERENTIAL/PLATELET
Abs Immature Granulocytes: 0.01 10*3/uL (ref 0.00–0.07)
Basophils Absolute: 0 10*3/uL (ref 0.0–0.1)
Basophils Relative: 1 %
Eosinophils Absolute: 0.1 10*3/uL (ref 0.0–0.5)
Eosinophils Relative: 1 %
HCT: 43.2 % (ref 36.0–46.0)
Hemoglobin: 14.2 g/dL (ref 12.0–15.0)
Immature Granulocytes: 0 %
Lymphocytes Relative: 19 %
Lymphs Abs: 1.1 10*3/uL (ref 0.7–4.0)
MCH: 31.5 pg (ref 26.0–34.0)
MCHC: 32.9 g/dL (ref 30.0–36.0)
MCV: 95.8 fL (ref 80.0–100.0)
Monocytes Absolute: 0.5 10*3/uL (ref 0.1–1.0)
Monocytes Relative: 9 %
Neutro Abs: 4 10*3/uL (ref 1.7–7.7)
Neutrophils Relative %: 70 %
Platelets: 197 10*3/uL (ref 150–400)
RBC: 4.51 MIL/uL (ref 3.87–5.11)
RDW: 12.6 % (ref 11.5–15.5)
WBC: 5.7 10*3/uL (ref 4.0–10.5)
nRBC: 0 % (ref 0.0–0.2)

## 2021-12-12 LAB — COMPREHENSIVE METABOLIC PANEL
ALT: 12 U/L (ref 0–44)
AST: 21 U/L (ref 15–41)
Albumin: 4.2 g/dL (ref 3.5–5.0)
Alkaline Phosphatase: 46 U/L (ref 38–126)
Anion gap: 8 (ref 5–15)
BUN: 8 mg/dL (ref 6–20)
CO2: 26 mmol/L (ref 22–32)
Calcium: 8.9 mg/dL (ref 8.9–10.3)
Chloride: 105 mmol/L (ref 98–111)
Creatinine, Ser: 0.71 mg/dL (ref 0.44–1.00)
GFR, Estimated: >60 mL/min (ref >60)
Glucose, Bld: 86 mg/dL (ref 70–99)
Potassium: 4.1 mmol/L (ref 3.5–5.1)
Sodium: 139 mmol/L (ref 135–145)
Total Bilirubin: 0.7 mg/dL (ref 0.3–1.2)
Total Protein: 6.7 g/dL (ref 6.5–8.1)

## 2021-12-12 LAB — POCT URINALYSIS DIPSTICK, ED / UC
Bilirubin Urine: NEGATIVE
Glucose, UA: NEGATIVE mg/dL
Nitrite: NEGATIVE
Protein, ur: NEGATIVE mg/dL
Specific Gravity, Urine: 1.025 (ref 1.005–1.030)
Urobilinogen, UA: 0.2 mg/dL (ref 0.0–1.0)
pH: 5.5 (ref 5.0–8.0)

## 2021-12-12 LAB — POC URINE PREG, ED: Preg Test, Ur: NEGATIVE

## 2021-12-12 LAB — CBG MONITORING, ED: Glucose-Capillary: 89 mg/dL (ref 70–99)

## 2021-12-12 MED ORDER — NITROFURANTOIN MONOHYD MACRO 100 MG PO CAPS: 100.0000 mg | ORAL_CAPSULE | Freq: Two times a day (BID) | ORAL | 0 refills | Status: DC

## 2021-12-12 NOTE — ED Triage Notes (Signed)
Pt reports feeling dizzy and neck pain when waking up this morning. States she fell on the floor in the bathroom and has been feeling hot and cold. States she felt nauseas.  ? ?States she started a treatment for yeast infection last night and is not sure if the medicine has caused the sxs.  ?

## 2021-12-12 NOTE — Discharge Instructions (Addendum)
I am concerned that you might be developing a urinary tract infection causing your symptoms.  Please start Macrobid twice daily.  This can impact your INR so please monitor for any signs of bleeding and follow-up with your Coumadin clinic as soon as possible.  We will contact you with your lab work if this is abnormal.  Please monitor your MyChart for these results.  Make sure you are resting and drinking plenty of fluid.  If you develop any worsening symptoms including passing out, recurrent lightheadedness, nausea/vomiting, weakness, fever, abdominal pain you need to go to the emergency room as we discussed. ?

## 2021-12-12 NOTE — ED Provider Notes (Signed)
[facility]    CSN: [id number] Arrival date & time: 12/12/21  8657      History   Chief Complaint Chief Complaint  Patient presents with   Dizziness   Neck Pain   Nausea   Chills    HPI Gloria Hamilton is a 26 y.o. female.   Patient presents today with a several day history of UTI symptoms.  Reports dysuria and urinary frequency but then also experienced some vaginal irritation so assume this was related to a yeast infection and took a dose of Monistat at 230 this morning.  When she woke up this morning she felt dizzy which she describes as a room spinning sensation with associated nausea.  She denies any vomiting, abdominal pain, changes in bowel habits.  She denies any additional medication changes.  Denies any recent head injury.  Denies any recent illness.  She does have mechanical valve and is chronically anticoagulated with warfarin; reports last INR was in therapeutic range she denies any obvious signs of bleeding.  She does not believe she is pregnant as she recently had her menstrual cycle; reports this was heavier than normal.  She does report some neck pain but states this is not particularly bothersome.  Denies any neck stiffness, decreased range of motion, fever, headache, photophobia.   Past Medical History:  Diagnosis Date   Anxiety    Bicuspid aortic valve    CHF (congestive heart failure) (HCC)    Congenital aortic insufficiency    H/O aortic valve replacement    Heart murmur    Migraines    Transposition of great vessels     Patient Active Problem List   Diagnosis Date Noted   Hemorrhagic shock (HCC) 07/04/2021   Acute blood loss anemia    H/O heart valve replacement with mechanical valve    Hemorrhagic ovarian cyst    Anticoagulated on Coumadin    Encounter for therapeutic drug monitoring 11/15/2020   Aortic valve replaced 11/15/2020    Past Surgical History:  Procedure Laterality Date   BENTALL PROCEDURE  05/17/2020   S/P Aortic Aneurysm  repair , Redo thoracotomy at [facility] Dr Kizzie Bane   CYST EXCISION     pilonodal   Tenny Craw Procedure      OB History   No obstetric history on file.      Home Medications    Prior to Admission medications   Medication Sig Start Date End Date Taking? Authorizing Provider  acetaminophen (TYLENOL) 325 MG tablet 1 tablet as needed    [provider]  aspirin 81 MG chewable tablet Chew 81 mg by mouth daily.    [provider]  metoprolol succinate (TOPROL XL) 25 MG 24 hr tablet Take 0.5 tablets (12.5 mg total) by mouth every evening. Please make yearly appt with Dr. Shari Prows for May 2023 for future refills. Thank you 1st attempt 12/02/21   Meriam Sprague, MD  nitrofurantoin, macrocrystal-monohydrate, (MACROBID) 100 MG capsule Take 1 capsule (100 mg total) by mouth 2 (two) times daily. 12/12/21   Orson Rho, Noberto Retort, PA-C  norethindrone (CAMILA) 0.35 MG tablet Take 1 tablet (0.35 mg total) by mouth daily. 07/16/18   Rasch, Victorino Dike I, NP  warfarin (COUMADIN) 1 MG tablet Take 2 tablets (2mg ) by mouth daily along with your warfarin 7.5mg  tablet  as instructed by the [facility]ticoagulation Clinic. 10/23/21   12/21/21, MD  warfarin (COUMADIN) 7.5 MG tablet Take 1 tablet (7.5mg ) by mouth daily as instructed by the Anticoagulation Clinic. 10/23/21  Meriam SpraguePemberton, Heather E, MD    Family History History reviewed. No pertinent family history.  Social History Social History   Tobacco Use   Smoking status: Never   Smokeless tobacco: Never  Vaping Use   Vaping Use: Never used  Substance Use Topics   Alcohol use: No   Drug use: No     Allergies   Ceftriaxone, Ciprofloxacin, and Lisinopril   Review of Systems Review of Systems  Constitutional:  Positive for activity change. Negative for appetite change, fatigue and fever.  Eyes:  Negative for photophobia and visual disturbance.  Respiratory:  Negative for cough and shortness of breath.   Cardiovascular:  Negative for chest pain.   Gastrointestinal:  Positive for nausea. Negative for abdominal pain, diarrhea and vomiting.  Musculoskeletal:  Negative for arthralgias and myalgias.  Neurological:  Positive for dizziness. Negative for syncope, weakness, light-headedness and headaches.    Physical Exam Triage Vital Signs ED Triage Vitals  Enc Vitals Group     BP 12/12/21 0944 104/75     Pulse Rate 12/12/21 0944 87     Resp 12/12/21 0944 18     Temp 12/12/21 0944 98.7 F (37.1 C)     Temp Source 12/12/21 0944 Oral     SpO2 12/12/21 0944 98 %     Weight --      Height --      Head Circumference --      Peak Flow --      Pain Score 12/12/21 0942 5     Pain Loc --      Pain Edu? --      Excl. in GC? --    Orthostatic VS for the past 24 hrs:  BP- Lying Pulse- Lying BP- Sitting Pulse- Sitting BP- Standing at 0 minutes Pulse- Standing at 0 minutes  12/12/21 1035 94/61 73 104/73 81 105/75 85    Updated Vital Signs BP 104/75 (BP Location: Right Arm)    Pulse 87    Temp 98.7 F (37.1 C) (Oral)    Resp 18    LMP 11/19/2021 (Exact Date)    SpO2 98%   Visual Acuity Right Eye Distance:   Left Eye Distance:   Bilateral Distance:    Right Eye Near:   Left Eye Near:    Bilateral Near:     Physical Exam Vitals reviewed.  Constitutional:      General: She is awake. She is not in acute distress.    Appearance: Normal appearance. She is well-developed. She is not ill-appearing.     Comments: Very pleasant female appears stated age in no acute distress sitting comfortably in exam room  HENT:     Head: Normocephalic and atraumatic. No raccoon eyes, Battle's sign or contusion.     Right Ear: Tympanic membrane, ear canal and external ear normal. No hemotympanum.     Left Ear: Tympanic membrane, ear canal and external ear normal. No hemotympanum.     Nose: Nose normal.     Mouth/Throat:     Tongue: Tongue does not deviate from midline.     Pharynx: Uvula midline. No oropharyngeal exudate or posterior oropharyngeal  erythema.  Eyes:     Extraocular Movements: Extraocular movements intact.     Conjunctiva/sclera: Conjunctivae normal.     Pupils: Pupils are equal, round, and reactive to light.  Cardiovascular:     Rate and Rhythm: Normal rate and regular rhythm.     Heart sounds: S1 normal and S2 normal. Murmur heard.  Comments: Click of mechanical valve present Pulmonary:     Effort: Pulmonary effort is normal.     Breath sounds: Normal breath sounds. No wheezing, rhonchi or rales.     Comments: Clear to auscultation bilaterally Abdominal:     General: Bowel sounds are normal.     Palpations: Abdomen is soft.     Tenderness: There is abdominal tenderness in the right lower quadrant, suprapubic area and left lower quadrant. There is no right CVA tenderness, left CVA tenderness, guarding or rebound.     Comments: Mild tenderness palpation in lower abdomen.  No evidence of acute abdomen on physical exam.  Musculoskeletal:     Cervical back: Normal range of motion and neck supple. No rigidity, tenderness or bony tenderness. No spinous process tenderness or muscular tenderness. Normal range of motion.     Comments: Strength 5/5 bilateral upper and lower extremities  Lymphadenopathy:     Head:     Right side of head: No submental, submandibular or tonsillar adenopathy.     Left side of head: No submental, submandibular or tonsillar adenopathy.  Neurological:     General: No focal deficit present.     Cranial Nerves: Cranial nerves 2-12 are intact.     Motor: Motor function is intact.     Coordination: Coordination is intact.     Gait: Gait is intact.  Psychiatric:        Behavior: Behavior is cooperative.     UC Treatments / Results  Labs (all labs ordered are listed, but only abnormal results are displayed) Labs Reviewed  POCT URINALYSIS DIPSTICK, ED / UC - Abnormal; Notable for the following components:      Result Value   Ketones, ur TRACE (*)    Hgb urine dipstick SMALL (*)     Leukocytes,Ua SMALL (*)    All other components within normal limits  URINE CULTURE  CBC WITH DIFFERENTIAL/PLATELET  COMPREHENSIVE METABOLIC PANEL  POC URINE PREG, ED  CBG MONITORING, ED    EKG   Radiology No results found.  Procedures Procedures (including critical care time)  Medications Ordered in UC Medications - No data to display  Initial Impression / Assessment and Plan / UC Course  I have reviewed the triage vital signs and the nursing notes.  Pertinent labs & imaging results that were available during my care of the patient were reviewed by me and considered in my medical decision making (see chart for details).     Vital signs and physical exam are reassuring today; no indication for emergent evaluation or imaging.  Fasting glucose was appropriate.  Urine pregnancy was negative.  UA did show some evidence of infection with positive leukocytes, hematuria, proteinuria.  Given recent UTI symptoms will treat empirically with Macrobid.  Patient initially requested a different form decides but discussed that this is not available in the [place].  She is chronically anticoagulated with warfarin and I discussed potential interaction of antibiotic with warfarin.  Recommended INR be checked within the next few days and she should monitor for any signs/symptoms of bleeding.  CBC and CMP were obtained today-results pending.  Will contact patient if blood work is abnormal and we need to change her treatment plan.  Discussed if she has any worsening symptoms she needs to go to the emergency room including abdominal pain, fever, significant bleeding, nausea/vomiting, weakness.  Strict return precautions given to which she expressed understanding.  Work excuse note provided.  Final Clinical Impressions(s) / UC Diagnoses  Final diagnoses:  Dizziness  Urinary frequency  Abnormal urinalysis  Chronic anticoagulation     Discharge Instructions      I am concerned that you might be  developing a urinary tract infection causing your symptoms.  Please start Macrobid twice daily.  This can impact your INR so please monitor for any signs of bleeding and follow-up with your Coumadin clinic as soon as possible.  We will contact you with your lab work if this is abnormal.  Please monitor your MyChart for these results.  Make sure you are resting and drinking plenty of fluid.  If you develop any worsening symptoms including passing out, recurrent lightheadedness, nausea/vomiting, weakness, fever, abdominal pain you need to go to the emergency room as we discussed.     ED Prescriptions     Medication Sig Dispense Auth. Provider   nitrofurantoin, macrocrystal-monohydrate, (MACROBID) 100 MG capsule Take 1 capsule (100 mg total) by mouth 2 (two) times daily. 10 capsule Rosine Solecki, Noberto Retort, PA-C      PDMP not reviewed this encounter.   Jeani Hawking, PA-C 12/12/21 1133

## 2021-12-14 LAB — URINE CULTURE: Culture: 80000 — AB

## 2021-12-16 ENCOUNTER — Encounter: Payer: Self-pay | Admitting: Cardiology

## 2021-12-23 DIAGNOSIS — F330 Major depressive disorder, recurrent, mild: Secondary | ICD-10-CM | POA: Diagnosis not present

## 2022-01-07 DIAGNOSIS — F330 Major depressive disorder, recurrent, mild: Secondary | ICD-10-CM | POA: Diagnosis not present

## 2022-01-08 ENCOUNTER — Encounter: Payer: Self-pay | Admitting: Cardiology

## 2022-01-15 ENCOUNTER — Ambulatory Visit (INDEPENDENT_AMBULATORY_CARE_PROVIDER_SITE_OTHER): Payer: BC Managed Care – PPO

## 2022-01-15 DIAGNOSIS — Z7901 Long term (current) use of anticoagulants: Secondary | ICD-10-CM | POA: Diagnosis not present

## 2022-01-15 DIAGNOSIS — Z5181 Encounter for therapeutic drug level monitoring: Secondary | ICD-10-CM | POA: Diagnosis not present

## 2022-01-15 DIAGNOSIS — Z952 Presence of prosthetic heart valve: Secondary | ICD-10-CM | POA: Diagnosis not present

## 2022-01-15 LAB — POCT INR: INR: 3.7 — AB (ref 2.0–3.0)

## 2022-01-15 NOTE — Patient Instructions (Addendum)
Description   ?Eat greens, hold today's dose and only take 0.5 tablet (3.75mg ) tomorrow then continue taking Warfarin 7.5mg  daily except for 9.5mg  on Mondays and Fridays.  ?Recheck INR in 1 week.  ?[facility] [phone].  ?  ?   ?

## 2022-01-22 ENCOUNTER — Ambulatory Visit (INDEPENDENT_AMBULATORY_CARE_PROVIDER_SITE_OTHER): Payer: BC Managed Care – PPO

## 2022-01-22 DIAGNOSIS — Z5181 Encounter for therapeutic drug level monitoring: Secondary | ICD-10-CM

## 2022-01-22 DIAGNOSIS — Z952 Presence of prosthetic heart valve: Secondary | ICD-10-CM | POA: Diagnosis not present

## 2022-01-22 DIAGNOSIS — F330 Major depressive disorder, recurrent, mild: Secondary | ICD-10-CM | POA: Diagnosis not present

## 2022-01-22 DIAGNOSIS — Z7901 Long term (current) use of anticoagulants: Secondary | ICD-10-CM | POA: Diagnosis not present

## 2022-01-22 LAB — POCT INR: INR: 2.6 (ref 2.0–3.0)

## 2022-01-22 NOTE — Patient Instructions (Signed)
Description   ?Take 3.75mg  today, then resume same dosage of Warfarin 7.5mg  daily except for 9.5mg  on Mondays and Fridays. Recheck INR in 2 weeks. [facility] [phone].  ?  ?  ?

## 2022-02-04 DIAGNOSIS — F330 Major depressive disorder, recurrent, mild: Secondary | ICD-10-CM | POA: Diagnosis not present

## 2022-02-05 ENCOUNTER — Ambulatory Visit (INDEPENDENT_AMBULATORY_CARE_PROVIDER_SITE_OTHER): Payer: BC Managed Care – PPO

## 2022-02-05 DIAGNOSIS — Z7901 Long term (current) use of anticoagulants: Secondary | ICD-10-CM | POA: Diagnosis not present

## 2022-02-05 DIAGNOSIS — Z952 Presence of prosthetic heart valve: Secondary | ICD-10-CM

## 2022-02-05 DIAGNOSIS — Z5181 Encounter for therapeutic drug level monitoring: Secondary | ICD-10-CM | POA: Diagnosis not present

## 2022-02-05 LAB — POCT INR: INR: 2.8 (ref 2.0–3.0)

## 2022-02-05 NOTE — Patient Instructions (Signed)
Description   ?Take 3.75mg  today, then START taking Warfarin 7.5mg  daily except for 9.5mg  on Mondays.  ?Recheck INR in 2 weeks.  ?[facility] [phone].  ?  ?   ?

## 2022-02-17 DIAGNOSIS — F330 Major depressive disorder, recurrent, mild: Secondary | ICD-10-CM | POA: Diagnosis not present

## 2022-02-19 ENCOUNTER — Ambulatory Visit (INDEPENDENT_AMBULATORY_CARE_PROVIDER_SITE_OTHER): Payer: BC Managed Care – PPO | Admitting: *Deleted

## 2022-02-19 DIAGNOSIS — Z7901 Long term (current) use of anticoagulants: Secondary | ICD-10-CM

## 2022-02-19 DIAGNOSIS — Z8269 Family history of other diseases of the musculoskeletal system and connective tissue: Secondary | ICD-10-CM | POA: Diagnosis not present

## 2022-02-19 DIAGNOSIS — Z5181 Encounter for therapeutic drug level monitoring: Secondary | ICD-10-CM

## 2022-02-19 DIAGNOSIS — Z952 Presence of prosthetic heart valve: Secondary | ICD-10-CM

## 2022-02-19 DIAGNOSIS — M2550 Pain in unspecified joint: Secondary | ICD-10-CM | POA: Diagnosis not present

## 2022-02-19 LAB — POCT INR: INR: 2.9 (ref 2.0–3.0)

## 2022-02-19 NOTE — Patient Instructions (Signed)
Description   ?Hold today's dose then START taking Warfarin 7.5mg  daily except 3.75mg  on Sundays. Recheck INR in 2 weeks. [facility] [phone].  ?  ?  ?

## 2022-02-26 ENCOUNTER — Other Ambulatory Visit: Payer: Self-pay | Admitting: Cardiology

## 2022-02-26 MED ORDER — METOPROLOL SUCCINATE ER 25 MG PO TB24: 12.5000 mg | ORAL_TABLET | Freq: Every evening | ORAL | 0 refills | Status: DC

## 2022-02-26 NOTE — Addendum Note (Signed)
Addended by: Mariam Dollar on: 02/26/2022 02:32 PM ? ? Modules accepted: Orders ? ?

## 2022-03-04 DIAGNOSIS — F330 Major depressive disorder, recurrent, mild: Secondary | ICD-10-CM | POA: Diagnosis not present

## 2022-03-05 ENCOUNTER — Ambulatory Visit (INDEPENDENT_AMBULATORY_CARE_PROVIDER_SITE_OTHER): Payer: BC Managed Care – PPO | Admitting: *Deleted

## 2022-03-05 DIAGNOSIS — Z7901 Long term (current) use of anticoagulants: Secondary | ICD-10-CM | POA: Diagnosis not present

## 2022-03-05 DIAGNOSIS — Z952 Presence of prosthetic heart valve: Secondary | ICD-10-CM | POA: Diagnosis not present

## 2022-03-05 DIAGNOSIS — Z5181 Encounter for therapeutic drug level monitoring: Secondary | ICD-10-CM

## 2022-03-05 LAB — POCT INR: INR: 1.6 — AB (ref 2.0–3.0)

## 2022-03-05 NOTE — Patient Instructions (Signed)
Description   Continue taking Warfarin 7.5mg  daily except 3.75mg  on Sundays. Recheck INR in 3 weeks. [facility] [phone].

## 2022-03-07 ENCOUNTER — Encounter: Payer: Self-pay | Admitting: Cardiology

## 2022-03-07 ENCOUNTER — Ambulatory Visit: Payer: BC Managed Care – PPO | Admitting: Cardiology

## 2022-03-07 VITALS — BP 92/62 | HR 82 | Ht 63.0 in | Wt 133.2 lb

## 2022-03-07 DIAGNOSIS — Z7901 Long term (current) use of anticoagulants: Secondary | ICD-10-CM

## 2022-03-07 DIAGNOSIS — Z952 Presence of prosthetic heart valve: Secondary | ICD-10-CM | POA: Diagnosis not present

## 2022-03-07 DIAGNOSIS — Q231 Congenital insufficiency of aortic valve: Secondary | ICD-10-CM

## 2022-03-07 DIAGNOSIS — R002 Palpitations: Secondary | ICD-10-CM | POA: Diagnosis not present

## 2022-03-07 DIAGNOSIS — I77819 Aortic ectasia, unspecified site: Secondary | ICD-10-CM

## 2022-03-07 MED ORDER — METOPROLOL SUCCINATE ER 25 MG PO TB24: 12.5000 mg | ORAL_TABLET | Freq: Every evening | ORAL | 3 refills | Status: AC

## 2022-03-07 NOTE — Progress Notes (Signed)
Cardiology Office Note:    Date:  03/07/2022   ID:  Gloria Hamilton, DOB 05/12/1996, MRN [id number]  PCP:  Scifres, Nicole Cellaorothy, PA-C (Inactive)  [facility] Cardiologist:  Meriam SpragueHeather E Malena Timpone, MD  [facility] Electrophysiologist:  None   Referring MD: No ref. provider found    History of Present Illness:    Gloria Sanesmily Germer is a 26 y.o. female with a hx of bicuspid aortic valve with aortic root dilation s/p mechanical AVR and root repair 06/06/20 on warfarin with goal INR 1.5-2.5, prior Ross procedure who presents to clinic for follow-up.  [facility] where she was doing well. Had an echocardiogram showing she mild segmental LV systolic dysfunction with septal hypokinesis most consistent with her previous surgical intervention rather than other issues. Ejection fraction was 40 to 45%. She did have a normally functioning aortic valve prosthesis with normal velocities. She underwent a treadmill EKG to assess exercise tolerance status post surgery with excellent exercise tolerance and no evidence of significant tachycardia or other rhythm disturbances. The patient did have shortness of breath and weakness with a stress test but no evidence of other symptoms. Holter monitor showed an average heart rate of 99 bpm with no evidence of rhythm disturbances. She previously had been on beta-blocker which caused some side effects. In addition to that the patient has otherwise felt fairly well and has tolerated her anticoagulation without any bleeding complications.  Was seen by PCP on 08/13/20 for tachycardia with HR 110s. Specifically, prior to starting cardiac rehab, they wanted clearance given her elevated HR which prompted referral to Cardiology.  Was seen in  clinic on 11/15/20, she was doing well. Continued to have HR 100-110s but no HF symptoms.   Had prior zio monitor for 3 days which was normal. Repeat 14d zio was placed which showed NSR 64-115, rare ectopy and no arrhythmias.  [facility] showed LVEF 58%, normal RV, mild RAE, normal LA, normal functioning AoV prosthesis, moderate PR, mild TR. No LGE. MRA with normal aortic root and ascending aorta.  She was hospitalized in 06/2021 with acute pelvic hematoma with hemoperitoneum likely related to a ruptured ovarian cyst. Her INR was reversed. She was managed conservatively and was started on heparin without issues. She was ultimately restarted on her warfarin and did well. Goal INR 1.5-2.5 given On-X valve with recovered EF.  Today, the patient overall feels well. Has occasional palpitations. Has been having joint pain and she is concerned she as ankylosing spondylitis as her mother has this. She has since been referred to Rheumatology. Otherwise, she is doing well on the metoprolol without significant issues. No SOB, orthopnea, or PND. Continues to have intermittent heavy menses but improved from before. No other bleeding issues.   Past Medical History:  Diagnosis Date   Anxiety    Bicuspid aortic valve    CHF (congestive heart failure) (HCC)    Congenital aortic insufficiency    H/O aortic valve replacement    Heart murmur    Migraines    Transposition of great vessels     Past Surgical History:  Procedure Laterality Date   BENTALL PROCEDURE  05/17/2020   S/P Aortic Aneurysm repair , Red[facility]ac[facility]   CYST EXCISION     pilonodal   Tenny Crawoss Procedure      Current Medications: Current Meds  Medication Sig   acetaminophen (TYLENOL) 325 MG tablet 1 tablet as needed   aspirin 81 MG chewable tablet  Chew 81 mg by mouth daily.   nitrofurantoin, macrocrystal-monohydrate, (MACROBID) 100 MG capsule Take 1 capsule (100 mg total) by mouth 2 (two) times daily.   norethindrone (CAMILA) 0.35 MG tablet Take 1 tablet (0.35 mg total) by mouth daily.   warfarin (COUMADIN) 1 MG tablet Take 2 tablets ( ) by mouth daily along with your warfarin 7.5mg  tablet  as instructed by the Anticoagulation Clinic.    warfarin (COUMADIN) 7.5 MG tablet Take 1 tablet (7.5mg ) by mouth daily as instructed by the Anticoagulation Clinic.   [DISCONTINUED] metoprolol succinate (TOPROL XL) 25 MG 24 hr tablet Take 0.5 tablets (12.5 mg total) by mouth every evening. Please call [phone] to schedule an overdue appointment for future refills. Thank you.     Allergies:   Ceftriaxone, Ciprofloxacin, and Lisinopril   Social History   Socioeconomic History   Marital status: Significant Other    Spouse name: Not on file   Number of children: 0   Years of education: 16   Highest education level: Not on file  Occupational History   Not on file  Tobacco Use   Smoking status: Never   Smokeless tobacco: Never  Vaping Use   Vaping Use: Never used  Substance and Sexual Activity   Alcohol use: No   Drug use: No   Sexual activity: Not on file  Other Topics Concern   Not on file  Social History Narrative   Not on file   Social Determinants of Health   Financial Resource Strain: Not on file  Food Insecurity: Not on file  Transportation Needs: Not on file  Physical Activity: Not on file  Stress: Not on file  Social Connections: Not on file     Family History: The patient's family history is not on file.  ROS:   Please see the history of present illness.    Review of Systems  Constitutional:  Negative for chills and fever.  HENT:  Negative for nosebleeds.   Eyes:  Negative for blurred vision.  Respiratory:  Negative for shortness of breath.   Cardiovascular:  Positive for palpitations. Negative for chest pain, orthopnea, claudication, leg swelling and PND.  Gastrointestinal:  Negative for blood in stool, melena, nausea and vomiting.  Genitourinary:  Negative for hematuria.  Musculoskeletal:  Negative for falls.  Neurological:  Negative for dizziness and loss of consciousness.  Endo/Heme/Allergies:  Negative for polydipsia.  Psychiatric/Behavioral:  Negative for substance abuse.    EKGs/Labs/Other  Studies Reviewed:    The following studies were reviewed today: CMR 07-17-21: MRI 03/07/2021 1. The left ventricle is normal in cavity size and wall thickness. Global systolic function is normal with  an LV ejection fraction calculated at 58%. There are no regional wall motion abnormalities.   2. The right ventricle is normal in cavity size, wall thickness, and systolic function.   3. The right atrium is mildly enlarged. The left atrium is normal in size.   4. There is a mechanical prosthetic valve in the aortic position. The leaflets are not well visualized due  to metal artifacts. However, the valve appears to be well-seated and leaflets motion appear to be normal.  Peak aortic flow velocity is 1.2 m/s. There is trivial aortic regurgitation. These findings are consistent  with a normal functioning prosthetic valve.   5. There is moderate pulmonic regurgitation. There is also mild tricuspid regurgitation. There are no other  significant valvular lesions.   6. Delayed enhancement imaging demonstrates no evidence of myocardial infarction, scar or infiltrative  disease.   7.  No intracardiac thrombus visualized.  TTE 09/26/20: Echo complete  Result Value Ref Range  LV Ejection Fraction (%) 40  Aortic Valve Regurgitation Grade trivial  Aortic Valve Stenosis Grade mechanical prosthetic  Aortic Valve Max Velocity (m/s) 1 m/sec  Aortic Valve Stenosis Mean Gradient (mmHg) 2.3 mmHg  Mitral Valve Regurgitation Grade mild  Mitral Valve Stenosis Grade none  Tricuspid Valve Regurgitation Grade mild  Tricuspid Valve Regurgitation Max Velocity (m/s) 2.3 m/sec  Right Ventricle Systolic Pressure (mmHg) 27.0 mmHg  LV End Diastolic Diameter (cm) 4.6 cm  LV End Systolic Diameter (cm) 3.4 cm  LV Septum Wall Thickness (cm) 1.1 cm  LV Posterior Wall Thickness (cm) 0.87 cm  Left Atrium Diameter (cm) 2.7 cm   Cardiac Monitor 12/07/20: Patch wear time was 13 days. Predominant rhythm was normal  sinus rhythm with average HR 92; HR ranged from 64-151bpm. Rare SVE (<1%), rare PVC (<1%) There is no SVT, NSVT/VT, Afib or significant pauses Overall, no significant arrhythmias detected on cardiac monitor.    EKG:  No ECG performed today  Recent Labs: 07/07/2021: Magnesium 2.0 12/12/2021: ALT 12; BUN 8; Creatinine, Ser 0.71; Hemoglobin 14.2; Platelets 197; Potassium 4.1; Sodium 139  Recent Lipid Panel No results found for: CHOL, TRIG, HDL, CHOLHDL, VLDL, LDLCALC, LDLDIRECT    Physical Exam:    VS:  BP 92/62   Pulse 82   Ht 5\' 3"  (1.6 m)   Wt 133 lb 3.2 oz (60.4 kg)   SpO2 97%   BMI 23.60 kg/m     Wt Readings from Last 3 Encounters:  03/07/22 133 lb 3.2 oz (60.4 kg)  07/06/21 143 lb 4.8 oz (65 kg)  03/01/21 131 lb 9.8 oz (59.7 kg)     GEN:  Well nourished, well developed in no acute distress, comfortable HEENT: Normal NECK: No JVD; No carotid bruits CARDIAC: RRR,crisp mechanical valve click with 3/6 systolic murmur heard throughout the precordium and in the subscapular area RESPIRATORY:  CTAB ABDOMEN: Soft, non-tender, non-distended MUSCULOSKELETAL:  No edema; No deformity  SKIN: Warm and dry NEUROLOGIC:  Alert and oriented x 3 PSYCHIATRIC:  Normal affect   ASSESSMENT:    1. Status post mechanical aortic valve replacement   2. Palpitations   3. Bicuspid aortic valve with ascending aorta greater than 4.0 cm in diameter   4. Aortic valve replaced   5. Chronic anticoagulation   6. Aortic dilatation (HCC)     PLAN:    In order of problems listed above:  #Bicuspid AoV with aortic dilation s/p mechanical AVR with root repair: #Chronic systolic heart failure with improved EF 40>58%: Surgery 05/16/20 at [facility]. LVEF by TTE 40-45% that improved to 58% by CMR in 02/2021. Normal AoV gradients with trivial AI. Tolerating warfarin with goal INR 1.5-2.5 (given On-X valve with normalized EF). Aortic root and ascending aorta size normal on MRA.   -Continue warfarin with goal  INR 1.5-2.5 -Continue metop 12.5mg  daily; no BP room for ARB at this time -CMR 02/2021 with EF 58% -MRA 02/2021 with normal root repair, no ascending/descending aortic dilation -Will repeat TTE for serial monitoring -Will repeat MRA for monitoring  #Moderate PI: History of Ross procedure. PR moderate on CMR 2022.  -Check TTE for monitoring   #Sinus Tachycardia: #Palpitations: Cardiac monitor 12/07/20 with NSR with HR 64-151 with no arrhythmias.  TTE as above with normal gradients across the valve. LVEF improved to 58% by CMR. Stress test normal. Symptoms significantly improved with metoprolol -Continue  metop 12.5mg  daily -Stress test at [facility] with no evidence of ischemia -CMR at [facility] 02/2021 with no LGE -Maintain adequate hydration  #Chronic AC: On warfarin for mechanical AoV as above. Goal INR 1.5-2.5  -Continue warfarin; follows with [facility] clinic  #Rupture of Ovarian Cyst with Resultant Anemia: Occurred in 06/2021 with resultant anemia and required warfarin reversal. Was managed conservatively and INR was reversed. She did well and was resumed on warfarin without issues.   Medication Adjustments/Labs and Tests Ordered: Current medicines are reviewed at length with the patient today.  Concerns regarding medicines are outlined above.  Orders Placed This Encounter  Procedures   MR Angiogram Chest W Wo Contrast   Basic metabolic panel   ECHOCARDIOGRAM COMPLETE   Meds ordered this encounter  Medications   metoprolol succinate (TOPROL XL) 25 MG 24 hr tablet    Sig: Take 0.5 tablets (12.5 mg total) by mouth every evening.    Dispense:  45 tablet    Refill:  3    Patient Instructions  Medication Instructions:   Your physician recommends that you continue on your current medications as directed. Please refer to the Current Medication list given to you today.  *If you need a refill on your cardiac medications before your next appointment, please call your  pharmacy*   Testing/Procedures:  Your physician has requested that you have an echocardiogram. Echocardiography is a painless test that uses sound waves to create images of your heart. It provides your doctor with information about the size and shape of your heart and how well your heart's chambers and valves are working. This procedure takes approximately one hour. There are no restrictions for this procedure.   MRA OF THE CHEST W/WO CONTRAST TO BE DONE AT [place]   Follow-Up: At [facility], you and your health needs are our priority.  As part of our continuing mission to provide you with exceptional heart care, we have created designated Provider Care Teams.  These Care Teams include your primary Cardiologist (physician) and Advanced Practice Providers (APPs -  Physician Assistants and Nurse Practitioners) who all work together to provide you with the care you need, when you need it.  We recommend signing up for the patient portal called "MyChart".  Sign up information is provided on this After Visit Summary.  MyChart is used to connect with patients for Virtual Visits (Telemedicine).  Patients are able to view lab/test results, encounter notes, upcoming appointments, etc.  Non-urgent messages can be sent to your provider as well.   To learn more about what you can do with MyChart, go to [email or web address].    Your next appointment:   1 year(s)  The format for your next appointment:   In Person  Provider:   Meriam Sprague, MD {   Important Information About Sugar          Signed, Meriam Sprague, MD  03/07/2022 9:29 AM    [facility]

## 2022-03-07 NOTE — Patient Instructions (Signed)
Medication Instructions:   Your physician recommends that you continue on your current medications as directed. Please refer to the Current Medication list given to you today.  *If you need a refill on your cardiac medications before your next appointment, please call your pharmacy*   Testing/Procedures:  Your physician has requested that you have an echocardiogram. Echocardiography is a painless test that uses sound waves to create images of your heart. It provides your doctor with information about the size and shape of your heart and how well your heart's chambers and valves are working. This procedure takes approximately one hour. There are no restrictions for this procedure.   MRA OF THE CHEST W/WO CONTRAST TO BE DONE AT [place]   Follow-Up: At [facility], you and your health needs are our priority.  As part of our continuing mission to provide you with exceptional heart care, we have created designated Provider Care Teams.  These Care Teams include your primary Cardiologist (physician) and Advanced Practice Providers (APPs -  Physician Assistants and Nurse Practitioners) who all work together to provide you with the care you need, when you need it.  We recommend signing up for the patient portal called "MyChart".  Sign up information is provided on this After Visit Summary.  MyChart is used to connect with patients for Virtual Visits (Telemedicine).  Patients are able to view lab/test results, encounter notes, upcoming appointments, etc.  Non-urgent messages can be sent to your provider as well.   To learn more about what you can do with MyChart, go to [email or web address].    Your next appointment:   1 year(s)  The format for your next appointment:   In Person  Provider:   Meriam Sprague, MD {   Important Information About Sugar

## 2022-03-16 ENCOUNTER — Ambulatory Visit (HOSPITAL_COMMUNITY)
Admission: RE | Admit: 2022-03-16 | Discharge: 2022-03-16 | Disposition: A | Discharge status: Home / Self Care | Payer: BC Managed Care – PPO | Source: Ambulatory Visit | Attending: Cardiology | Admitting: Cardiology

## 2022-03-16 DIAGNOSIS — I712 Thoracic aortic aneurysm, without rupture, unspecified: Secondary | ICD-10-CM | POA: Diagnosis not present

## 2022-03-16 DIAGNOSIS — Z952 Presence of prosthetic heart valve: Secondary | ICD-10-CM | POA: Diagnosis not present

## 2022-03-16 DIAGNOSIS — I77819 Aortic ectasia, unspecified site: Secondary | ICD-10-CM | POA: Diagnosis present

## 2022-03-16 DIAGNOSIS — Q231 Congenital insufficiency of aortic valve: Secondary | ICD-10-CM | POA: Insufficient documentation

## 2022-03-16 DIAGNOSIS — Q251 Coarctation of aorta: Secondary | ICD-10-CM | POA: Diagnosis not present

## 2022-03-16 DIAGNOSIS — Q278 Other specified congenital malformations of peripheral vascular system: Secondary | ICD-10-CM | POA: Diagnosis not present

## 2022-03-16 IMAGING — MR MR MRA CHEST W/ OR W/O CM
20 series · 20 of 20 positions shown · IV contrast (gadavist)
Comparison: CT [DATE]

CLINICAL DATA: 26-year-old female with history of bicuspid aortic
valve, associated aneurysm disease, remote history of SARA DANIELA
procedure, with interval redo sternotomy and reported Bentall
surgery

EXAM:
MRA CHEST WITH OR WITHOUT CONTRAST
TECHNIQUE: Angiographic images of the chest were obtained using MRA technique
without and with intravenous contrast.
CONTRAST:  6mL GADAVIST GADOBUTROL 1 MMOL/ML IV SOLN

[Series 2: t2_trufi_tra_p2_bh · axial · 8.0mm · 0.62mm/px · 1 of 22 slices shown]
[im 1/22]
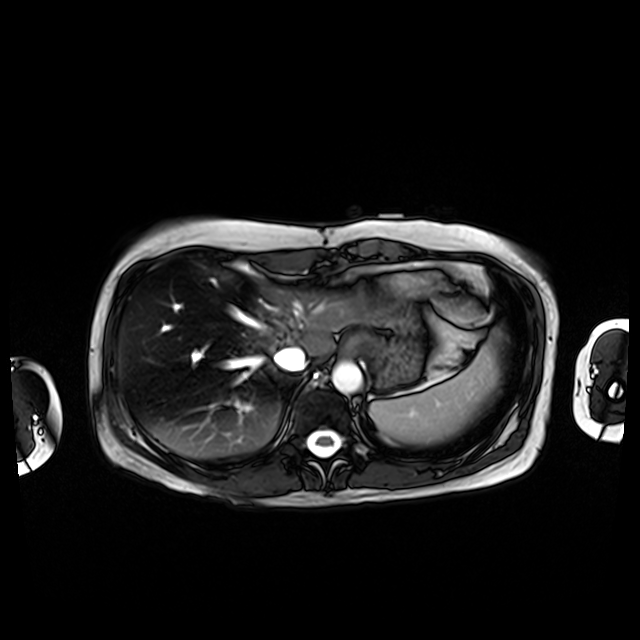

[Series 3: axial_db_haste_loc · axial · 7.0mm · 1.48mm/px · 1 of 25 slices shown]
[im 1/25]
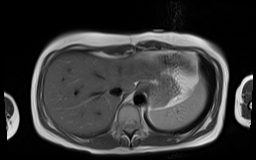

[Series 5: T1 dynamic · axial · non-contrast · 3.3mm · 1.18mm/px · 1 of 72 slices shown]
[im 1/72]
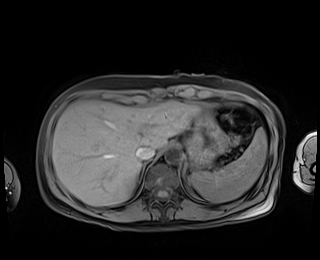

[Series 6: (person_name)_(person_name)_(person_name) · sagittal · 8.0mm · 1.79mm/px · 1 of 25 slices shown (1 of 9)]
[im 1/25]
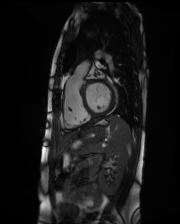

[Series 6: (person_name)_(person_name)_(person_name) · sagittal · 8.0mm · 1.79mm/px · 1 of 25 slices shown (2 of 9)]
[im 1/25]
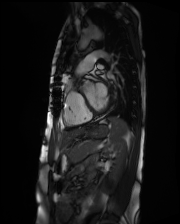

[Series 6: (person_name)_(person_name)_(person_name) · sagittal · 8.0mm · 1.79mm/px · 1 of 25 slices shown (3 of 9)]
[im 1/25]
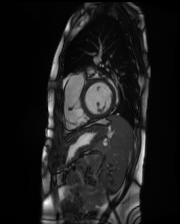

[Series 6: (person_name)_(person_name)_(person_name) · sagittal · 8.0mm · 1.79mm/px · 1 of 25 slices shown (4 of 9)]
[im 1/25]
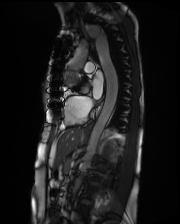

[Series 6: (person_name)_(person_name)_(person_name) · sagittal · 8.0mm · 1.79mm/px · 1 of 25 slices shown (5 of 9)]
[im 1/25]
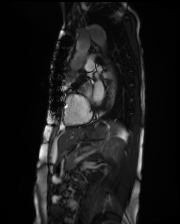

[Series 6: (person_name)_(person_name)_(person_name) · sagittal · 8.0mm · 1.79mm/px · 1 of 25 slices shown (6 of 9)]
[im 1/25]
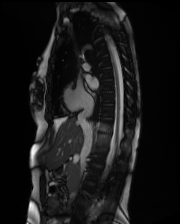

[Series 6: (person_name)_(person_name)_(person_name) · sagittal · 8.0mm · 1.79mm/px · 1 of 25 slices shown (7 of 9)]
[im 1/25]
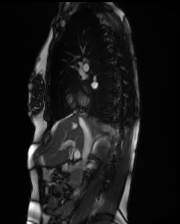

[Series 6: (person_name)_(person_name)_(person_name) · sagittal · 8.0mm · 1.79mm/px · 1 of 25 slices shown (8 of 9)]
[im 1/25]
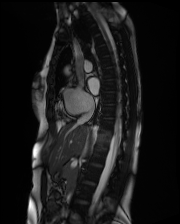

[Series 6: (person_name)_(person_name)_(person_name) · sagittal · 8.0mm · 1.79mm/px · 1 of 25 slices shown (9 of 9)]
[im 1/25]
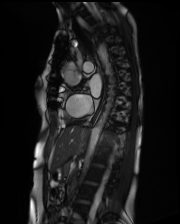

[Series 7: angio_fl3d_sag_pre · sagittal · 1.1mm · 1.17mm/px · 1 of 144 slices shown]
[im 1/144]
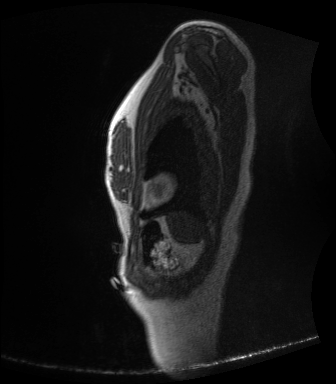

[Series 9: candy cane ce-arterial · sagittal · arterial · 1.1mm · 1.17mm/px · 1 of 144 slices shown]
[im 1/144]
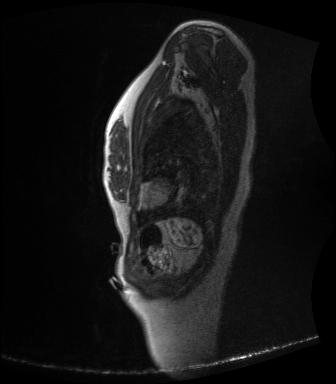

[Series 10: candy cane ce-arterial_sub · sagittal · 1.1mm · 1.17mm/px · 1 of 144 slices shown]
[im 1/144]
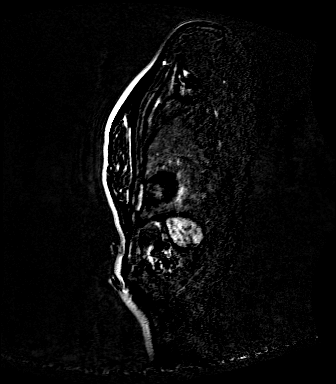

[Series 12: candy cane ce-venous · sagittal · portal-venous · 1.1mm · 1.17mm/px · 1 of 144 slices shown]
[im 1/144]
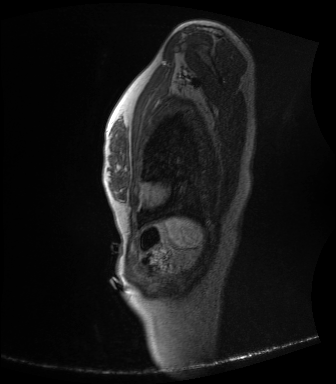

[Series 13: candy cane ce-venous_sub · sagittal · 1.1mm · 1.17mm/px · 1 of 144 slices shown]
[im 1/144]
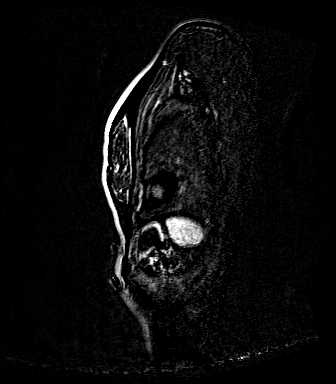

[Series 15: T1 dynamic post-contrast · axial · 3.3mm · 1.18mm/px · 1 of 72 slices shown]
[im 1/72]
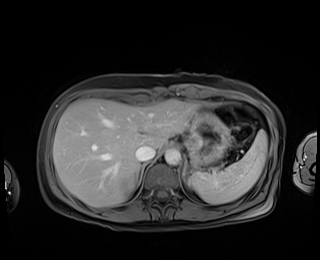

[Series 1020: mip tumble · axial · 1.1mm · 0.47mm/px · 1 of 3 slices shown (1 of 2)]
[im 1/3]
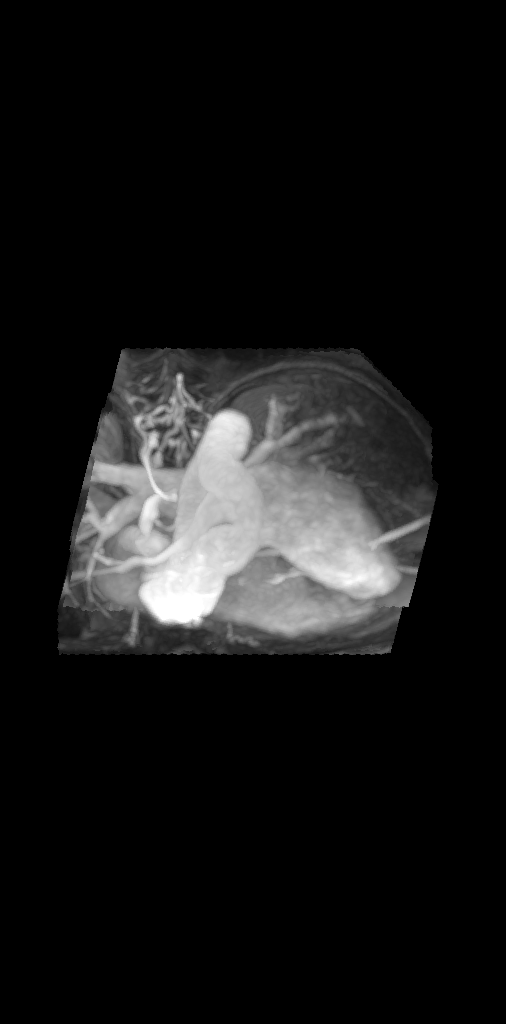

[Series 1032: mip tumble · axial · 1.1mm · 0.46mm/px · 1 of 3 slices shown (2 of 2)]
[im 1/3]
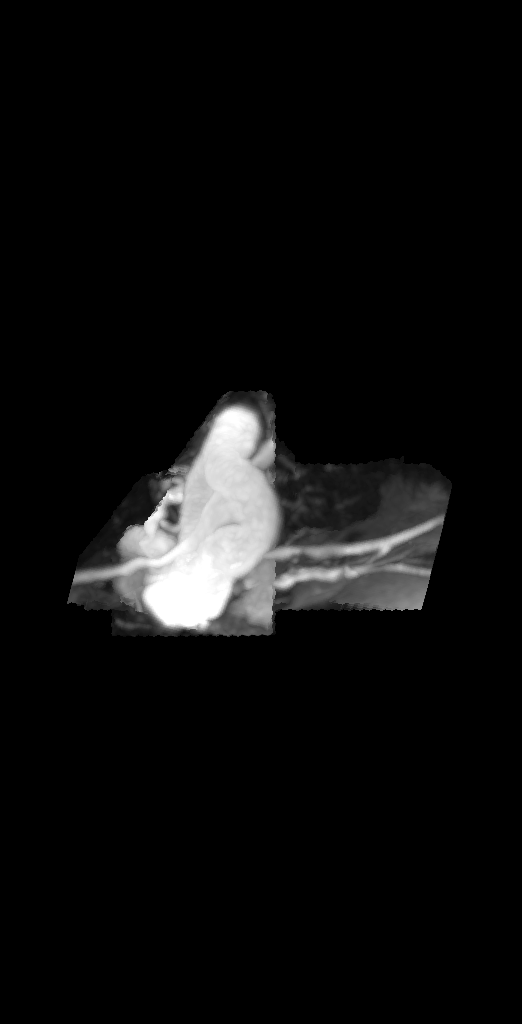

[20 of 20 positions shown; findings below may reference images not displayed]

FINDINGS: VASCULAR

Aorta: Redemonstration of median sternotomy, prior surgical changes
formal Bentall surgery with aortic valve/ascending aorta composite
graft with coronary artery reimplantation. The configuration of the
graft and appearance of the distal anastomosis is unchanged when
compared to the prior. No evidence of late complicating features
such as anastomotic pseudoaneurysm.

Configuration of the aortic arch similar to prior, compatible with
pseudo-coarctation.

Redemonstration of aberrant right subclavian artery origin. Flow
signal of the branch vessels maintained.

No evidence of dissection. No evidence of wall thickening or
periaortic fluid.

Greatest estimated diameter of the ascending aorta beyond the
surgical anastomosis 25 mm.

Greatest estimated diameter of the descending thoracic aorta 22 mm.

Diameter estimated at the hiatus 16 mm.

Heart: Heart size unchanged from the prior with borderline
cardiomegaly. No pericardial fluid.

Pulmonary Arteries: Redemonstration of surgical changes of remote
SARA DANIELA procedure. Flow signal of the pulmonary arteries maintained.

Other: None

NON-VASCULAR

Chest wall: Unremarkable.  No adenopathy.

Lungs/pleura: Unremarkable signal of the lungs.  No pleural fluid.

Mediastinum:

No adenopathy. Artifact from the sternal wires somewhat limits
evaluation. Unremarkable thoracic esophagus.

Skeletal structures: Signal within the marrow space uniform,
unremarkable.
IMPRESSION: Redemonstration of surgical changes of redo median sternotomy and
formal Bentall procedure (composite mechanical aortic
valve/ascending aortic graft, with coronary artery reimplantation),
with no late complicating features.

Redemonstration of surgical changes of remote SARA DANIELA procedure.

Redemonstration of aberrant right subclavian artery origin and
pseudo-coarctation configuration of the aorta, with no aneurysm.

## 2022-03-16 MED ORDER — GADOBUTROL 1 MMOL/ML IV SOLN
6.0000 mL | Freq: Once | INTRAVENOUS | Status: AC | PRN
  Administered 2022-03-16: 6 mL via INTRAVENOUS

## 2022-03-19 ENCOUNTER — Telehealth: Payer: Self-pay | Admitting: *Deleted

## 2022-03-19 DIAGNOSIS — Z952 Presence of prosthetic heart valve: Secondary | ICD-10-CM

## 2022-03-19 DIAGNOSIS — I77819 Aortic ectasia, unspecified site: Secondary | ICD-10-CM

## 2022-03-19 DIAGNOSIS — Q231 Congenital insufficiency of aortic valve: Secondary | ICD-10-CM

## 2022-03-19 NOTE — Telephone Encounter (Signed)
The patient has been notified of the result and verbalized understanding.  All questions (if any) were answered.  Pt aware I will go ahead and place the order for repeat MRA OF THE CHEST in the system for one year out, and send a message to our [employer] Schedulers to call her back closer to that time, to arrange this test.  Future BMET placed as well. Pt verbalized understanding and agrees with this plan.

## 2022-03-19 NOTE — Telephone Encounter (Signed)
-----   Message from Loa Socks, LPN sent at 12/11/4968  4:37 PM EDT -----  ----- Message ----- From: Meriam Sprague, MD Sent: 03/17/2022   7:55 PM EDT To: [address] Triage  Her MRA of her aorta looks great with normal post-op changes. Will continue to monitor going forward with MRA scans yearly for now. We can spread out imaging after next year if all remains stable.

## 2022-03-25 DIAGNOSIS — F330 Major depressive disorder, recurrent, mild: Secondary | ICD-10-CM | POA: Diagnosis not present

## 2022-03-26 ENCOUNTER — Ambulatory Visit (INDEPENDENT_AMBULATORY_CARE_PROVIDER_SITE_OTHER): Payer: BC Managed Care – PPO

## 2022-03-26 DIAGNOSIS — Z5181 Encounter for therapeutic drug level monitoring: Secondary | ICD-10-CM

## 2022-03-26 DIAGNOSIS — Z7901 Long term (current) use of anticoagulants: Secondary | ICD-10-CM | POA: Diagnosis not present

## 2022-03-26 DIAGNOSIS — Z952 Presence of prosthetic heart valve: Secondary | ICD-10-CM | POA: Diagnosis not present

## 2022-03-26 LAB — POCT INR: INR: 1.8 — AB (ref 2.0–3.0)

## 2022-03-26 NOTE — Patient Instructions (Signed)
Description   Continue taking Warfarin 7.5mg daily except 3.75mg on Sundays.  Recheck INR in 4 weeks.  Coumadin Clinic [phone].       

## 2022-03-27 ENCOUNTER — Ambulatory Visit (HOSPITAL_COMMUNITY): Payer: BC Managed Care – PPO | Attending: Internal Medicine

## 2022-03-27 DIAGNOSIS — I77819 Aortic ectasia, unspecified site: Secondary | ICD-10-CM | POA: Insufficient documentation

## 2022-03-27 DIAGNOSIS — Z952 Presence of prosthetic heart valve: Secondary | ICD-10-CM | POA: Insufficient documentation

## 2022-03-27 DIAGNOSIS — Q231 Congenital insufficiency of aortic valve: Secondary | ICD-10-CM | POA: Diagnosis not present

## 2022-03-27 LAB — ECHOCARDIOGRAM COMPLETE
AV Mean grad: 3.4 mmHg
AV Peak grad: 5.1 mmHg
Ao pk vel: 1.13 m/s
Area-P 1/2: 2.83 cm2
S' Lateral: 2.8 cm

## 2022-03-31 ENCOUNTER — Encounter: Payer: Self-pay | Admitting: Cardiology

## 2022-03-31 DIAGNOSIS — Z952 Presence of prosthetic heart valve: Secondary | ICD-10-CM

## 2022-03-31 DIAGNOSIS — I371 Nonrheumatic pulmonary valve insufficiency: Secondary | ICD-10-CM

## 2022-03-31 NOTE — Telephone Encounter (Signed)
[facility] Scheduling team faxed referral to Dr. Reita Cliche office today.  His office will make contact with the pt to arrange  consult appt with them.

## 2022-03-31 NOTE — Telephone Encounter (Signed)
Echo Results Received: Today Gloria Sprague, MD  Loa Socks, LPN Can we get her scheduled with Dr. Meredeth Ide? I will send him a heads-up message as well.    Referral to Dr. Meredeth Ide Congenital Cardiologist with [facility] placed and will send [facility] Scheduling dept a message to call his office to help coordinate this appt.  Pt aware of plan.

## 2022-04-03 DIAGNOSIS — I7121 Aneurysm of the ascending aorta, without rupture: Secondary | ICD-10-CM | POA: Diagnosis not present

## 2022-04-03 DIAGNOSIS — Z952 Presence of prosthetic heart valve: Secondary | ICD-10-CM | POA: Diagnosis not present

## 2022-04-03 DIAGNOSIS — I5032 Chronic diastolic (congestive) heart failure: Secondary | ICD-10-CM | POA: Diagnosis not present

## 2022-04-03 DIAGNOSIS — I351 Nonrheumatic aortic (valve) insufficiency: Secondary | ICD-10-CM | POA: Diagnosis not present

## 2022-04-07 DIAGNOSIS — F330 Major depressive disorder, recurrent, mild: Secondary | ICD-10-CM | POA: Diagnosis not present

## 2022-04-08 ENCOUNTER — Other Ambulatory Visit: Payer: Self-pay

## 2022-04-08 DIAGNOSIS — Z5181 Encounter for therapeutic drug level monitoring: Secondary | ICD-10-CM

## 2022-04-08 MED ORDER — WARFARIN SODIUM 7.5 MG PO TABS: ORAL_TABLET | ORAL | 0 refills | Status: DC

## 2022-04-16 ENCOUNTER — Other Ambulatory Visit: Payer: Self-pay | Admitting: Cardiology

## 2022-04-16 DIAGNOSIS — Z5181 Encounter for therapeutic drug level monitoring: Secondary | ICD-10-CM

## 2022-04-16 MED ORDER — WARFARIN SODIUM 7.5 MG PO TABS: ORAL_TABLET | ORAL | 0 refills | Status: DC

## 2022-04-22 DIAGNOSIS — F330 Major depressive disorder, recurrent, mild: Secondary | ICD-10-CM | POA: Diagnosis not present

## 2022-04-23 ENCOUNTER — Ambulatory Visit (INDEPENDENT_AMBULATORY_CARE_PROVIDER_SITE_OTHER): Payer: BC Managed Care – PPO

## 2022-04-23 DIAGNOSIS — Z7901 Long term (current) use of anticoagulants: Secondary | ICD-10-CM | POA: Diagnosis not present

## 2022-04-23 DIAGNOSIS — Z952 Presence of prosthetic heart valve: Secondary | ICD-10-CM | POA: Diagnosis not present

## 2022-04-23 DIAGNOSIS — Z5181 Encounter for therapeutic drug level monitoring: Secondary | ICD-10-CM

## 2022-04-23 LAB — POCT INR: INR: 1.7 — AB (ref 2.0–3.0)

## 2022-04-23 NOTE — Patient Instructions (Signed)
Description   Continue taking Warfarin 7.5mg  daily except 3.75mg  on Sundays.  Recheck INR in 4 weeks.  Coumadin Clinic [phone].

## 2022-04-24 ENCOUNTER — Other Ambulatory Visit (HOSPITAL_COMMUNITY): Payer: Self-pay | Admitting: *Deleted

## 2022-04-24 ENCOUNTER — Ambulatory Visit (HOSPITAL_COMMUNITY): Payer: BC Managed Care – PPO | Attending: Cardiology

## 2022-04-24 DIAGNOSIS — I509 Heart failure, unspecified: Secondary | ICD-10-CM | POA: Diagnosis not present

## 2022-04-24 DIAGNOSIS — F419 Anxiety disorder, unspecified: Secondary | ICD-10-CM | POA: Insufficient documentation

## 2022-04-24 DIAGNOSIS — I371 Nonrheumatic pulmonary valve insufficiency: Secondary | ICD-10-CM

## 2022-04-24 DIAGNOSIS — R0600 Dyspnea, unspecified: Secondary | ICD-10-CM | POA: Diagnosis not present

## 2022-04-24 DIAGNOSIS — I714 Abdominal aortic aneurysm, without rupture, unspecified: Secondary | ICD-10-CM | POA: Insufficient documentation

## 2022-05-12 DIAGNOSIS — F330 Major depressive disorder, recurrent, mild: Secondary | ICD-10-CM | POA: Diagnosis not present

## 2022-05-14 DIAGNOSIS — R300 Dysuria: Secondary | ICD-10-CM | POA: Diagnosis not present

## 2022-05-20 ENCOUNTER — Telehealth: Payer: Self-pay | Admitting: *Deleted

## 2022-05-20 DIAGNOSIS — F330 Major depressive disorder, recurrent, mild: Secondary | ICD-10-CM | POA: Diagnosis not present

## 2022-05-20 NOTE — Patient Outreach (Signed)
  Care Management   Outreach Note  05/20/2022 Name: Gloria Hamilton MRN: [id number] DOB: 03-25-1996  Successful contact was made with the patient to discuss care management and care coordination services. Patient declines engagement at this time.   Follow Up Plan:  No further follow up required:   Reece Levy MSW, LCSW Licensed Clinical Social Worker      [phone]

## 2022-05-21 ENCOUNTER — Ambulatory Visit (INDEPENDENT_AMBULATORY_CARE_PROVIDER_SITE_OTHER): Payer: BC Managed Care – PPO

## 2022-05-21 DIAGNOSIS — Z5181 Encounter for therapeutic drug level monitoring: Secondary | ICD-10-CM | POA: Diagnosis not present

## 2022-05-21 DIAGNOSIS — Z952 Presence of prosthetic heart valve: Secondary | ICD-10-CM | POA: Diagnosis not present

## 2022-05-21 DIAGNOSIS — Z7901 Long term (current) use of anticoagulants: Secondary | ICD-10-CM | POA: Diagnosis not present

## 2022-05-21 DIAGNOSIS — R350 Frequency of micturition: Secondary | ICD-10-CM | POA: Diagnosis not present

## 2022-05-21 LAB — POCT INR: INR: 1.2 — AB (ref 2.0–3.0)

## 2022-05-21 NOTE — Patient Instructions (Signed)
Description   Take 11.25mg  today and then continue taking Warfarin 7.5mg  daily except 3.75mg  on Sundays.  Recheck INR in 3 weeks.  Coumadin Clinic [phone].

## 2022-06-02 ENCOUNTER — Encounter: Payer: Self-pay | Admitting: Cardiology

## 2022-06-02 NOTE — Telephone Encounter (Signed)
Covering preop today. Since patient is a congenital heart disease patient, I will need to elicit MD input to find out if anything else needed from pre-op provider end. Per review of notes under Media, patient is pending cardiac catheterization and possible transcatheter pulmonary valve replacement on 06/25/22. There is a scanned note where they gave her Lovenox/Coumadin bridging already under Media as well though Megan, pharmD, provided additional commentary below. Will route to Dr. Shari Prows to find out if any other recommendations needed prior to clearing from provider standpoint for above procedure and to weigh in on anticoag recs. Dr. Shari Prows - - Please route response to P CV DIV PREOP (the pre-op pool). Thank you.

## 2022-06-03 DIAGNOSIS — F330 Major depressive disorder, recurrent, mild: Secondary | ICD-10-CM | POA: Diagnosis not present

## 2022-06-03 NOTE — Telephone Encounter (Signed)
Will route back to preop box for review

## 2022-06-12 ENCOUNTER — Ambulatory Visit: Payer: BC Managed Care – PPO | Attending: Cardiology | Admitting: *Deleted

## 2022-06-12 DIAGNOSIS — Z7901 Long term (current) use of anticoagulants: Secondary | ICD-10-CM

## 2022-06-12 DIAGNOSIS — Z952 Presence of prosthetic heart valve: Secondary | ICD-10-CM

## 2022-06-12 DIAGNOSIS — Z5181 Encounter for therapeutic drug level monitoring: Secondary | ICD-10-CM

## 2022-06-12 LAB — POCT INR: INR: 2 (ref 2.0–3.0)

## 2022-06-12 NOTE — Patient Instructions (Signed)
Description   Continue taking Warfarin 7.5mg  daily except 3.75mg  on Sundays. Follow Lovenox instructions from [facility] regarding procedure. Recheck INR in 1 week post procedure. Coumadin Clinic [phone].

## 2022-06-18 DIAGNOSIS — Z01818 Encounter for other preprocedural examination: Secondary | ICD-10-CM | POA: Diagnosis not present

## 2022-06-24 NOTE — Telephone Encounter (Signed)
Spoke with Laural Golden, Pharmacist concerning recent message from patient concerning bruising from Lovenox Injections.   Laural Golden stated - "very unlikely she injected air into her bloodstream. It is possible she may have hit a blood vessel but that should heal. Pt's Lovenox instructions are managed by [facility]. I would suggest pt send them the picture to confirm."  Called pt concerning bruising from lovenox injection. Pt stated she sent [facility] and [facility] picture and message and has already spoke with [facility] concerning bruising due to injections. Pt stated she was instructed to continue lovenox injections. Pt also stated the bruise has not gotten any larger and seems to be improving. Pt is scheduled for her procedure tomorrow and has a follow-up appt with [facility] on 07/01/22 - post procedure. Reminded pt of upcoming appt and instructed to call if she has anymore concerns.

## 2022-06-25 DIAGNOSIS — Q249 Congenital malformation of heart, unspecified: Secondary | ICD-10-CM | POA: Diagnosis not present

## 2022-06-25 DIAGNOSIS — Z951 Presence of aortocoronary bypass graft: Secondary | ICD-10-CM | POA: Diagnosis not present

## 2022-06-25 DIAGNOSIS — Z79899 Other long term (current) drug therapy: Secondary | ICD-10-CM | POA: Diagnosis not present

## 2022-06-25 DIAGNOSIS — Z9889 Other specified postprocedural states: Secondary | ICD-10-CM | POA: Diagnosis not present

## 2022-06-25 DIAGNOSIS — Z881 Allergy status to other antibiotic agents status: Secondary | ICD-10-CM | POA: Diagnosis not present

## 2022-06-25 DIAGNOSIS — I5032 Chronic diastolic (congestive) heart failure: Secondary | ICD-10-CM | POA: Diagnosis not present

## 2022-06-25 DIAGNOSIS — Z952 Presence of prosthetic heart valve: Secondary | ICD-10-CM | POA: Diagnosis not present

## 2022-06-25 DIAGNOSIS — I350 Nonrheumatic aortic (valve) stenosis: Secondary | ICD-10-CM | POA: Diagnosis not present

## 2022-06-25 DIAGNOSIS — Z954 Presence of other heart-valve replacement: Secondary | ICD-10-CM | POA: Diagnosis not present

## 2022-06-25 DIAGNOSIS — I371 Nonrheumatic pulmonary valve insufficiency: Secondary | ICD-10-CM | POA: Diagnosis not present

## 2022-06-25 DIAGNOSIS — J984 Other disorders of lung: Secondary | ICD-10-CM | POA: Diagnosis not present

## 2022-06-25 DIAGNOSIS — Z7901 Long term (current) use of anticoagulants: Secondary | ICD-10-CM | POA: Diagnosis not present

## 2022-06-25 DIAGNOSIS — I341 Nonrheumatic mitral (valve) prolapse: Secondary | ICD-10-CM | POA: Diagnosis not present

## 2022-06-25 DIAGNOSIS — Z7982 Long term (current) use of aspirin: Secondary | ICD-10-CM | POA: Diagnosis not present

## 2022-06-25 DIAGNOSIS — Q231 Congenital insufficiency of aortic valve: Secondary | ICD-10-CM | POA: Diagnosis not present

## 2022-06-25 DIAGNOSIS — R110 Nausea: Secondary | ICD-10-CM | POA: Diagnosis not present

## 2022-06-30 ENCOUNTER — Ambulatory Visit: Payer: BC Managed Care – PPO | Attending: Cardiology

## 2022-06-30 DIAGNOSIS — Z952 Presence of prosthetic heart valve: Secondary | ICD-10-CM

## 2022-06-30 DIAGNOSIS — Z5181 Encounter for therapeutic drug level monitoring: Secondary | ICD-10-CM

## 2022-06-30 DIAGNOSIS — Z7901 Long term (current) use of anticoagulants: Secondary | ICD-10-CM | POA: Diagnosis not present

## 2022-06-30 LAB — POCT INR: INR: 1.3 — AB (ref 2.0–3.0)

## 2022-06-30 NOTE — Patient Instructions (Addendum)
Description   Continue Lovenox injections today and tomorrow. Take 1.5 tablets today and 1.5 tablets tomorrow and then continue taking Warfarin 7.5mg  daily except 3.75mg  on Sundays. Recheck INR in 1 week post procedure. [facility] [phone].

## 2022-07-01 ENCOUNTER — Ambulatory Visit: Payer: BC Managed Care – PPO

## 2022-07-01 ENCOUNTER — Emergency Department (HOSPITAL_COMMUNITY)
Admission: EM | Admit: 2022-07-01 | Discharge: 2022-07-02 | Disposition: A | Discharge status: Home / Self Care | Payer: BC Managed Care – PPO | Attending: Emergency Medicine | Admitting: Emergency Medicine

## 2022-07-01 ENCOUNTER — Encounter (HOSPITAL_COMMUNITY): Payer: Self-pay | Admitting: Emergency Medicine

## 2022-07-01 DIAGNOSIS — M79605 Pain in left leg: Secondary | ICD-10-CM | POA: Insufficient documentation

## 2022-07-01 DIAGNOSIS — Z7901 Long term (current) use of anticoagulants: Secondary | ICD-10-CM | POA: Insufficient documentation

## 2022-07-01 DIAGNOSIS — R9431 Abnormal electrocardiogram [ECG] [EKG]: Secondary | ICD-10-CM | POA: Diagnosis not present

## 2022-07-01 DIAGNOSIS — R011 Cardiac murmur, unspecified: Secondary | ICD-10-CM | POA: Diagnosis not present

## 2022-07-01 DIAGNOSIS — Z7982 Long term (current) use of aspirin: Secondary | ICD-10-CM | POA: Insufficient documentation

## 2022-07-01 DIAGNOSIS — T81719A Complication of unspecified artery following a procedure, not elsewhere classified, initial encounter: Secondary | ICD-10-CM | POA: Insufficient documentation

## 2022-07-01 DIAGNOSIS — R1032 Left lower quadrant pain: Secondary | ICD-10-CM | POA: Diagnosis not present

## 2022-07-01 DIAGNOSIS — T81718A Complication of other artery following a procedure, not elsewhere classified, initial encounter: Secondary | ICD-10-CM

## 2022-07-01 DIAGNOSIS — I509 Heart failure, unspecified: Secondary | ICD-10-CM | POA: Insufficient documentation

## 2022-07-01 DIAGNOSIS — Y828 Other medical devices associated with adverse incidents: Secondary | ICD-10-CM | POA: Diagnosis not present

## 2022-07-01 DIAGNOSIS — I724 Aneurysm of artery of lower extremity: Secondary | ICD-10-CM | POA: Diagnosis not present

## 2022-07-01 DIAGNOSIS — I743 Embolism and thrombosis of arteries of the lower extremities: Secondary | ICD-10-CM | POA: Diagnosis not present

## 2022-07-01 LAB — CBC WITH DIFFERENTIAL/PLATELET
Abs Immature Granulocytes: 0.02 10*3/uL (ref 0.00–0.07)
Basophils Absolute: 0 10*3/uL (ref 0.0–0.1)
Basophils Relative: 1 %
Eosinophils Absolute: 0.1 10*3/uL (ref 0.0–0.5)
Eosinophils Relative: 1 %
HCT: 41.4 % (ref 36.0–46.0)
Hemoglobin: 13.5 g/dL (ref 12.0–15.0)
Immature Granulocytes: 0 %
Lymphocytes Relative: 22 %
Lymphs Abs: 1.2 10*3/uL (ref 0.7–4.0)
MCH: 31.1 pg (ref 26.0–34.0)
MCHC: 32.6 g/dL (ref 30.0–36.0)
MCV: 95.4 fL (ref 80.0–100.0)
Monocytes Absolute: 0.5 10*3/uL (ref 0.1–1.0)
Monocytes Relative: 9 %
Neutro Abs: 3.6 10*3/uL (ref 1.7–7.7)
Neutrophils Relative %: 67 %
Platelets: 197 10*3/uL (ref 150–400)
RBC: 4.34 MIL/uL (ref 3.87–5.11)
RDW: 12.8 % (ref 11.5–15.5)
WBC: 5.4 10*3/uL (ref 4.0–10.5)
nRBC: 0 % (ref 0.0–0.2)

## 2022-07-01 LAB — BASIC METABOLIC PANEL
Anion gap: 7 (ref 5–15)
BUN: 7 mg/dL (ref 6–20)
CO2: 25 mmol/L (ref 22–32)
Calcium: 8.7 mg/dL — ABNORMAL LOW (ref 8.9–10.3)
Chloride: 105 mmol/L (ref 98–111)
Creatinine, Ser: 0.8 mg/dL (ref 0.44–1.00)
GFR, Estimated: >60 mL/min (ref >60)
Glucose, Bld: 135 mg/dL — ABNORMAL HIGH (ref 70–99)
Potassium: 3.7 mmol/L (ref 3.5–5.1)
Sodium: 137 mmol/L (ref 135–145)

## 2022-07-01 MED ORDER — ACETAMINOPHEN 325 MG PO TABS
650.0000 mg | ORAL_TABLET | Freq: Four times a day (QID) | ORAL | Status: DC | PRN
  Administered 2022-07-02: 650 mg via ORAL
  Filled 2022-07-01: qty 2

## 2022-07-01 NOTE — ED Triage Notes (Signed)
Pt reports heart surgery on Thursday for valve replacement. Endorses bruising and tenderness to left groin site. Surgeon sent her for imaging.

## 2022-07-01 NOTE — ED Provider Triage Note (Signed)
Emergency Medicine Provider Triage Evaluation Note  Gloria Hamilton , a 26 y.o. female  was evaluated in triage.  Pt complains of wound check. Pulmonary valve replaced last week in cath lab. Since yesterday, Left groin is more bruised, swollen and painful. It has progressively worsened since yesterday. No fever at home. No vomiting, nausea, and diarrhea. Endorsing SOB since yesterday but no CP. Spoke with cardiac PA who advised to come in for evaluation given pain, swelling.   H/o aortic valve replaced in 2021  Review of Systems  Positive: See above Negative: See above  Physical Exam  BP 124/86 (BP Location: Right Arm)   Pulse 88   Temp 98.2 F (36.8 C) (Oral)   Resp 16   Ht 5\' 3"  (1.6 m)   Wt 59 kg   SpO2 100%   BMI 23.03 kg/m  Gen:   Awake, no distress   Resp:  Normal effort  MSK:   Moves extremities without difficulty  Other:  Significant swelling and ecchymosis left groin.  Medical Decision Making  Medically screening exam initiated at 7:30 PM.  Appropriate orders placed.  Allee Busk was informed that the remainder of the evaluation will be completed by another provider, this initial triage assessment does not replace that evaluation, and the importance of remaining in the ED until their evaluation is complete.  Cbc, bmp, u/s left leg   Harriet Pho, PA-C 07/01/22 1944

## 2022-07-02 ENCOUNTER — Emergency Department (HOSPITAL_BASED_OUTPATIENT_CLINIC_OR_DEPARTMENT_OTHER): Payer: BC Managed Care – PPO

## 2022-07-02 ENCOUNTER — Other Ambulatory Visit: Payer: Self-pay

## 2022-07-02 DIAGNOSIS — I724 Aneurysm of artery of lower extremity: Secondary | ICD-10-CM | POA: Diagnosis not present

## 2022-07-02 DIAGNOSIS — M79605 Pain in left leg: Secondary | ICD-10-CM | POA: Diagnosis not present

## 2022-07-02 DIAGNOSIS — I743 Embolism and thrombosis of arteries of the lower extremities: Secondary | ICD-10-CM | POA: Diagnosis not present

## 2022-07-02 LAB — PROTIME-INR
INR: 1.9 — ABNORMAL HIGH (ref 0.8–1.2)
Prothrombin Time: 21.5 seconds — ABNORMAL HIGH (ref 11.4–15.2)

## 2022-07-02 MED ORDER — HYDROMORPHONE HCL 1 MG/ML IJ SOLN
0.5000 mg | INTRAMUSCULAR | Status: DC | PRN
  Administered 2022-07-02: 0.5 mg via INTRAVENOUS
  Filled 2022-07-02: qty 1

## 2022-07-02 NOTE — Discharge Instructions (Addendum)
You have been seen today for your complaint of incisional site pain. Your lab work was reassuring and showed no abnormalities. Your imaging showed a left femoral artery pseudoaneurysm.  This was successfully treated with compression. Home care instructions are as follows:  You should rest.  You should not perform vigorous activity until repeat ultrasound is performed.  You may take up to 1000 mg Tylenol every 6 hours as needed for pain. Follow up with: Dr. Stanford Breed.  His office should call you to schedule an appointment.  If you do not hear from them in the next 48 hours, you should call to schedule an appointment.  Please seek immediate medical care if you develop any of the following symptoms: You have severe or ongoing, persistent pain at the site of the pseudoaneurysm. There is bleeding or drainage from the site. The part of your body where the pseudoaneurysm is located changes color or becomes painful, cold, or numb. You have chest pain or shortness of breath. You feel like you might faint or you faint. At this time there does not appear to be the presence of an emergent medical condition, however there is always the potential for conditions to change. Please read and follow the below instructions.  Do not take your medicine if  develop an itchy rash, swelling in your mouth or lips, or difficulty breathing; call 911 and seek immediate emergency medical attention if this occurs.  You may review your lab tests and imaging results in their entirety on your MyChart account.  Please discuss all results of fully with your primary care provider and other specialist at your follow-up visit.  Note: Portions of this text may have been transcribed using voice recognition software. Every effort was made to ensure accuracy; however, inadvertent computerized transcription errors may still be present.

## 2022-07-02 NOTE — ED Provider Notes (Signed)
[facility] EMERGENCY DEPARTMENT Provider Note   CSN: [id number] Arrival date & time: 07/01/22  1745     History  Chief Complaint  Patient presents with   Wound Check    Gloria Hamilton is a 26 y.o. female.  With a history of congenital heart defect, pulmonary insufficiency who presents to the ED for evaluation of left groin pain and swelling after cardiac procedure (pulmonic valve replacement) on 06/25/2022 .  Patient reports that today replaced the valve through bilateral femoral approach.  She has been restarted on her anticoagulants.  She reported pain and swelling began to increase yesterday.  Describes the pain as an ache.  Rates it as 7 out of 10.  Took Tylenol with minimal relief.  She did take her Lovenox well in the ER waiting room and reported that that decrease the pain for a few hours, but pain did return. Patient is currently on lovenox to coumadin bridge. Denies fevers, chills, chest pain, nausea, vomiting, abdominal pain, numbness, weakness, tingling, cough, hemoptysis, dizziness, lightheadedness.  She did shortness of breath while in triage, but believes this was due to the pain and currently has no shortness of breath.   Wound Check       Home Medications Prior to Admission medications   Medication Sig Start Date End Date Taking? Authorizing Provider  acetaminophen (TYLENOL) 325 MG tablet 1 tablet as needed    [provider]  aspirin 81 MG chewable tablet Chew 81 mg by mouth daily.    [provider]  metoprolol succinate (TOPROL XL) 25 MG 24 hr tablet Take 0.5 tablets (12.5 mg total) by mouth every evening. 03/07/22   Meriam Sprague, MD  nitrofurantoin, macrocrystal-monohydrate, (MACROBID) 100 MG capsule Take 1 capsule (100 mg total) by mouth 2 (two) times daily. 12/12/21   Raspet, Noberto Retort, PA-C  norethindrone (CAMILA) 0.35 MG tablet Take 1 tablet (0.35 mg total) by mouth daily. 07/16/18   Rasch, Victorino Dike I, NP  warfarin (COUMADIN)  1 MG tablet Take 2 tablets (2mg ) by mouth daily along with your warfarin 7.5mg  tablet  as instructed by the Anticoagulation Clinic. 10/23/21   12/21/21, MD  warfarin (COUMADIN) 7.5 MG tablet Take 1/2 tablet (3.75mg )  to 1 tablet (7.5mg ) by mouth daily as instructed by the Anticoagulation Clinic. 04/16/22   06/17/22, MD      Allergies    Ceftriaxone, Ciprofloxacin, and Lisinopril    Review of Systems   Review of Systems  Musculoskeletal:  Positive for myalgias.  Skin:  Positive for color change.  All other systems reviewed and are negative.   Physical Exam Updated Vital Signs BP 90/70   Pulse 82   Temp 98 F (36.7 C) (Oral)   Resp 17   Ht 5\' 3"  (1.6 m)   Wt 59 kg   SpO2 95%   BMI 23.03 kg/m  Physical Exam Vitals and nursing note reviewed. Exam conducted with a chaperone present Meriam Sprague, NT).  Constitutional:      General: She is not in acute distress.    Appearance: Normal appearance. She is normal weight. She is not ill-appearing.  HENT:     Head: Normocephalic and atraumatic.  Cardiovascular:     Rate and Rhythm: Normal rate and regular rhythm.     Heart sounds: Murmur heard.  Pulmonary:     Effort: Pulmonary effort is normal. No respiratory distress.     Breath sounds: Normal breath sounds.  Abdominal:  General: Abdomen is flat.     Tenderness: There is no abdominal tenderness.  Musculoskeletal:        General: Swelling (There is no swelling to the left groin region.  No induration.) and tenderness (Left groin) present. Normal range of motion.     Cervical back: Neck supple.  Skin:    General: Skin is warm and dry.     Capillary Refill: Capillary refill takes less than 2 seconds.     Findings: Ecchymosis present.     Comments: See images for ecchymosis to left groin Surgical site  Neurological:     Mental Status: She is alert and oriented to person, place, and time.  Psychiatric:        Mood and Affect: Mood normal.        Behavior:  Behavior normal.        ED Results / Procedures / Treatments   Labs (all labs ordered are listed, but only abnormal results are displayed) Labs Reviewed  BASIC METABOLIC PANEL - Abnormal; Notable for the following components:      Result Value   Glucose, Bld 135 (*)    Calcium 8.7 (*)    All other components within normal limits  CBC WITH DIFFERENTIAL/PLATELET    EKG None  Radiology No results found.  Procedures Procedures    Medications Ordered in ED Medications  acetaminophen (TYLENOL) tablet 650 mg (has no administration in time range)    ED Course/ Medical Decision Making/ A&P                           Medical Decision Making This patient presents to the ED for concern of left groin pain and ecchymosis, this involves an extensive number of treatment options, and is a complaint that carries with it a high risk of complications and morbidity.  The differential diagnosis includes DVT, pseudoaneurysm, traumatic injury   Co morbidities that complicate the patient evaluation  Congenital heart defect, aortic valve replacement, recent pulmonic valve replacement (06/25/2022)  My initial workup includes basic labs, ultrasound  Additional history obtained from: Nursing notes from this visit. Previous records within EMR system operative note, 06/25/2022 for pulmonic valve replacement  I ordered, reviewed and interpreted labs which include: CBC, BMP, INR.  Patient currently bridging from Lovenox to warfarin, no anemia all other labs within normal limits.   I ordered imaging studies including ultrasound I independently visualized and interpreted imaging which showed 2.9 cm x 1.3 cm pseudoaneurysm with partial thrombosis.  Ultrasound pseudoaneurysm compression successful. I agree with the radiologist interpretation  Cardiac Monitoring:  The patient was maintained on a cardiac monitor.  I personally viewed and interpreted the cardiac monitored which showed an underlying  rhythm of: NSR  Consultations Obtained:  Dr. Ronnald Nian requested consultation with Vascular surgery Dr. Stanford Breed,  and discussed lab and imaging findings as well as pertinent plan - they recommend: Ultrasound pseudoaneurysm compression for 20 minutes.  If this is successful, patient will be scheduled for outpatient follow-up and repeat ultrasound. See Dr. Ronnald Nian and Dr Stanford Breed notes for more information.   Febrile, hemodynamically stable.  Patient is a 26 year old female history of congenital heart defect and multiple valve replacements.  Presenting to the ED for evaluation of left surgical incision site swelling and bruising.  Ultrasound showed femoral artery pseudoaneurysm as described above.  Vascular surgery was consulted.  Requested ultrasound compression for 20 minutes.  This was performed successfully.  Vascular surgery will follow  up with patient as an outpatient.  They will call her to schedule an appointment.  Informed patient that she should call if she does not hear from them in 48 hours.  Pain to be controlled with p.o. Tylenol as needed.  Stable at discharge.  At this time there does not appear to be any evidence of an acute emergency medical condition and the patient appears stable for discharge with appropriate outpatient follow up. Diagnosis was discussed with patient who verbalizes understanding of care plan and is agreeable to discharge. I have discussed return precautions with patient who verbalizes understanding. Patient encouraged to follow-up with their PCP within 1 week. All questions answered.  Patient's case discussed with Dr. Lockie Mola who agrees with plan to discharge with follow-up.   Note: Portions of this report may have been transcribed using voice recognition software. Every effort was made to ensure accuracy; however, inadvertent computerized transcription errors may still be present.          Final Clinical Impression(s) / ED Diagnoses Final diagnoses:  None     Rx / DC Orders ED Discharge Orders     None         Michelle Piper, PA-C 07/02/22 1513    Virgina Norfolk, DO 07/05/22 [phone]

## 2022-07-02 NOTE — Consult Note (Signed)
[facility] [place]  ASSESSMENT / PLAN: 26 y.o. female with partially thrombosed left common femoral artery pseudoaneurysm after percutaneous intervention at [facility] for pulmonary valve insufficiency.  The patient has a mechanical aortic valve and has been maintained on Coumadin.  She underwent successful ultrasound-guided compression of the pseudoaneurysm neck which induced thrombosis of the pseudoaneurysm.  Recommend bedrest for 2 hours in the emergency department.  She can be discharged after this.  Follow-up with me in 2 to 3 days with repeat duplex of left groin to ensure pseudoaneurysm remains closed.  She can keep her scheduled follow-up with her [facility] cardiologist, and Dr. Shari Prows.  CHIEF COMPLAINT: Left groin pain  HISTORY OF PRESENT ILLNESS: Gloria Hamilton is a 26 y.o. female with a hx of bicuspid aortic valve with aortic root dilation s/p mechanical AVR and root repair 06/06/20 on warfarin with goal INR 1.5-2.5, prior Ross procedure.  She underwent a percutaneous intervention for pulmonary insufficiency on 06/25/2022 at [facility].  She tolerated this well.  She was discharged on the same day.  She had significant bruising in her groin postprocedure.  This is gotten worse.  This prompted her presentation today.  Duplex performed the ultrasound revealed left common femoral artery pseudoaneurysm.    Past Medical History:  Diagnosis Date   Anxiety    Bicuspid aortic valve    CHF (congestive heart failure) (HCC)    Congenital aortic insufficiency    H/O aortic valve replacement    Heart murmur    Migraines    Transposition of great vessels     Past Surgical History:  Procedure Laterality Date   BENTALL PROCEDURE  05/17/2020   S/P Aortic Aneurysm repair , Redo thoracotomy at [facility] Dr Kizzie Bane   CYST EXCISION     pilonodal   Tenny Craw Procedure      No family history on file.  Social History   Socioeconomic History   Marital status: Significant  Other    Spouse name: Not on file   Number of children: 0   Years of education: 16   Highest education level: Not on file  Occupational History   Not on file  Tobacco Use   Smoking status: Never   Smokeless tobacco: Never  Vaping Use   Vaping Use: Never used  Substance and Sexual Activity   Alcohol use: No   Drug use: No   Sexual activity: Not on file  Other Topics Concern   Not on file  Social History Narrative   Not on file   Social Determinants of Health   Financial Resource Strain: Not on file  Food Insecurity: Not on file  Transportation Needs: Not on file  Physical Activity: Not on file  Stress: Not on file  Social Connections: Not on file  Intimate Partner Violence: Not on file    Allergies  Allergen Reactions   Ceftriaxone Rash    Lower extremity edema   Ciprofloxacin Other (See Comments)    "symptoms more elevated so tries to avoid"    Lisinopril Cough    Current Facility-Administered Medications  Medication Dose Route Frequency Provider Last Rate Last Admin   acetaminophen (TYLENOL) tablet 650 mg  650 mg Oral Q6H PRN Gareth Eagle, PA-C   650 mg at 07/02/22 1015   HYDROmorphone (DILAUDID) injection 0.5 mg  0.5 mg Intravenous Q4H PRN Leonie Douglas, MD       Current Outpatient Medications  Medication Sig Dispense Refill   acetaminophen (TYLENOL) 325 MG  tablet Take 650 mg by mouth every 6 (six) hours as needed for mild pain.     aspirin 81 MG chewable tablet Chew 81 mg by mouth daily.     docusate sodium (COLACE) 100 MG capsule Take 100 mg by mouth 2 (two) times daily as needed for mild constipation.     enoxaparin (LOVENOX) 60 MG/0.6ML injection Inject 60 mg into the skin every 12 (twelve) hours.     metoprolol succinate (TOPROL XL) 25 MG 24 hr tablet Take 0.5 tablets (12.5 mg total) by mouth every evening. (Patient taking differently: Take 12.5 mg by mouth daily.) 45 tablet 3   nitrofurantoin, macrocrystal-monohydrate, (MACROBID) 100 MG capsule Take  1 capsule (100 mg total) by mouth 2 (two) times daily. (Patient taking differently: Take 100 mg by mouth daily as needed (UTI symptoms).) 10 capsule 0   norethindrone (CAMILA) 0.35 MG tablet Take 1 tablet (0.35 mg total) by mouth daily. 1 Package 5   polyethylene glycol (MIRALAX / GLYCOLAX) 17 g packet Take 17 g by mouth daily as needed for mild constipation.     SODIUM FLUORIDE 5000 PLUS 1.1 % CREA dental cream Place 1 Application onto teeth every other day.     warfarin (COUMADIN) 7.5 MG tablet Take 1/2 tablet (3.75mg )  to 1 tablet (7.5mg ) by mouth daily as instructed by the Anticoagulation Clinic. (Patient taking differently: Take 7.5-11.25 mg by mouth See admin instructions. Taking 1 & 1/2 tab = 11.25 mg on Monday and Tuesday. Wednesday through Sunday taking 1 tab 7.5 mg daily as instructed by the Anticoagulation Clinic.) 100 tablet 0   warfarin (COUMADIN) 1 MG tablet Take 2 tablets (2mg ) by mouth daily along with your warfarin 7.5mg  tablet  as instructed by the Anticoagulation Clinic. 70 tablet 0    PHYSICAL EXAM Vitals:   07/02/22 0840 07/02/22 0950 07/02/22 1000 07/02/22 1100  BP: (!) 86/66 (!) 89/70 91/68 (!) 88/67  Pulse: 75 71 73 77  Resp: 16 14 18 16   Temp:      TempSrc:      SpO2: 97% 99% 98% 98%  Weight:      Height:       Well-appearing young woman in no acute distress Regular rate and rhythm Unlabored breathing Right groin with mild, expected postoperative ecchymosis after catheterization Left groin significantly ecchymotic, with tracking down the medial thigh.  Very tender in the left groin.  Palpable femoral pulse.  PERTINENT LABORATORY AND RADIOLOGIC DATA  Most recent CBC    Latest Ref Rng & Units 07/01/2022    7:49 PM 12/12/2021   10:23 AM 07/07/2021   10:39 AM  CBC  WBC 4.0 - 10.5 K/uL 5.4  5.7  3.6   Hemoglobin 12.0 - 15.0 g/dL 02/11/2022  07/09/2021  17.7   Hematocrit 36.0 - 46.0 % 41.4  43.2  29.5   Platelets 150 - 400 K/uL 197  197  121      Most recent CMP     Latest Ref Rng & Units 07/01/2022    7:49 PM 12/12/2021   10:23 AM 07/07/2021   10:39 AM  CMP  Glucose 70 - 99 mg/dL 02/11/2022  86  07/09/2021   BUN 6 - 20 mg/dL 7  8  <5   Creatinine 092 - 1.00 mg/dL 330  0.76  2.26   Sodium 135 - 145 mmol/L 137  139  137   Potassium 3.5 - 5.1 mmol/L 3.7  4.1  3.4   Chloride 98 - 111 mmol/L  105  105  106   CO2 22 - 32 mmol/L 25  26  25    Calcium 8.9 - 10.3 mg/dL 8.7  8.9  8.3   Total Protein 6.5 - 8.1 g/dL  6.7    Total Bilirubin 0.3 - 1.2 mg/dL  0.7    Alkaline Phos 38 - 126 U/L  46    AST 15 - 41 U/L  21    ALT 0 - 44 U/L  12     Duplex ultrasound shows partially thrombosed left common femoral artery pseudoaneurysm with 1 cm neck.  Yevonne Aline. Stanford Breed, MD [facility] Phone Number: [phone] 07/02/2022 11:25 AM  Total time spent on preparing this encounter including chart review, data review, collecting history, examining the patient, coordinating care for this new patient, 60 minutes.  Portions of this report may have been transcribed using voice recognition software.  Every effort has been made to ensure accuracy; however, inadvertent computerized transcription errors may still be present.

## 2022-07-02 NOTE — Progress Notes (Signed)
Successful pseudoaneurysm compression after 20 minutes u/s guided manual compression with Dr. Stanford Breed. Refer to "CV Proc" under chart review to view preliminary results.  07/02/2022 12:53 PM Kelby Aline., MHA, RVT, RDCS, RDMS

## 2022-07-02 NOTE — Progress Notes (Addendum)
Lower extremity pseudoaneurysm check and venous study completed.  Preliminary results relayed to [facility], DO.  See CV Proc for preliminary results report.   Darlin Coco, RDMS, RVT   Darlin Coco, RDMS, RVT

## 2022-07-02 NOTE — ED Provider Notes (Signed)
Patient here with pain in her left groin.  26 years old, history of congenital heart defect with pulmonary insufficiency status post aortic valve replacement now status post pulmonic valve replacement done last week at [facility].  She had bilateral femoral lines at that time.  She is got pain and swelling in the left groin with bruising.  She is on Lovenox to Coumadin bridge currently.  Differential diagnosis likely pseudoaneurysm and less likely blood clot.  Patient was handed off to [place] by overnight team awaiting duplex study and DVT study.  Blood work is unremarkable.  Her vital signs are normal.  She has chronically low blood pressure, blood pressure typically in the upper 80s low 90s.  Hemoglobin appears to be improved from last week's hemoglobin.  INR is 1.9.  Patient appears to have pseudoaneurysm per vascular report.  Patient has 2.9 x 1.3 area of aneurysm off the distal common femoral artery.  Overall suspect that there is partially thrombosed area at 1.2 x 1.9 cm.  Dr. Stanford Breed with vascular surgery was consulted.  He will try targeted ultrasound compression of this area for 20 minutes with vascular tech.  If that does not work we will likely do prothrombin injection/further intervention to help.  Awaiting reevaluation with vascular surgery after ultrasound compression.  Please see PA note for further results, evaluation, disposition of the patient.  Disposition to be dictated by vascular surgeon.   Lennice Sites, DO 07/02/22 1252

## 2022-07-03 ENCOUNTER — Telehealth: Payer: Self-pay | Admitting: Vascular Surgery

## 2022-07-03 ENCOUNTER — Other Ambulatory Visit (HOSPITAL_COMMUNITY): Payer: Self-pay | Admitting: Vascular Surgery

## 2022-07-03 DIAGNOSIS — F330 Major depressive disorder, recurrent, mild: Secondary | ICD-10-CM | POA: Diagnosis not present

## 2022-07-03 DIAGNOSIS — T81718A Complication of other artery following a procedure, not elsewhere classified, initial encounter: Secondary | ICD-10-CM

## 2022-07-04 ENCOUNTER — Ambulatory Visit
Admission: RE | Admit: 2022-07-04 | Discharge: 2022-07-04 | Disposition: A | Discharge status: Home / Self Care | Payer: BC Managed Care – PPO | Source: Ambulatory Visit | Attending: Vascular Surgery | Admitting: Vascular Surgery

## 2022-07-04 ENCOUNTER — Ambulatory Visit (INDEPENDENT_AMBULATORY_CARE_PROVIDER_SITE_OTHER): Payer: BC Managed Care – PPO | Admitting: *Deleted

## 2022-07-04 ENCOUNTER — Ambulatory Visit: Payer: BC Managed Care – PPO | Admitting: Vascular Surgery

## 2022-07-04 DIAGNOSIS — T81718A Complication of other artery following a procedure, not elsewhere classified, initial encounter: Secondary | ICD-10-CM | POA: Diagnosis not present

## 2022-07-04 DIAGNOSIS — Z952 Presence of prosthetic heart valve: Secondary | ICD-10-CM | POA: Diagnosis not present

## 2022-07-04 DIAGNOSIS — Z7901 Long term (current) use of anticoagulants: Secondary | ICD-10-CM

## 2022-07-04 DIAGNOSIS — I729 Aneurysm of unspecified site: Secondary | ICD-10-CM | POA: Insufficient documentation

## 2022-07-04 DIAGNOSIS — Z5181 Encounter for therapeutic drug level monitoring: Secondary | ICD-10-CM | POA: Insufficient documentation

## 2022-07-04 LAB — POCT INR: INR: 2 (ref 2.0–3.0)

## 2022-07-04 NOTE — Progress Notes (Signed)
Reviewed repeat duplex result with patient. Pseudoaneurysm remains thrombosed. She can follow up with me as needed.  Yevonne Aline. Stanford Breed, MD [facility] Phone Number: [phone] 07/04/2022 3:14 PM

## 2022-07-04 NOTE — Patient Instructions (Signed)
Description   Stop lovenox. Continue taking Warfarin 7.5mg  daily except 3.75mg  on Sundays. Recheck INR  in 2 weeks. Coumadin Clinic [phone].

## 2022-07-15 ENCOUNTER — Ambulatory Visit: Payer: BC Managed Care – PPO | Attending: Cardiology | Admitting: *Deleted

## 2022-07-15 DIAGNOSIS — Z952 Presence of prosthetic heart valve: Secondary | ICD-10-CM

## 2022-07-15 DIAGNOSIS — Z5181 Encounter for therapeutic drug level monitoring: Secondary | ICD-10-CM | POA: Diagnosis not present

## 2022-07-15 DIAGNOSIS — Z7901 Long term (current) use of anticoagulants: Secondary | ICD-10-CM | POA: Diagnosis not present

## 2022-07-15 LAB — POCT INR: INR: 1.7 — AB (ref 2.0–3.0)

## 2022-07-15 NOTE — Patient Instructions (Signed)
Description   Continue taking Warfarin 7.5mg  daily except 3.75mg  on Sundays. Recheck INR  in 3 weeks. Coumadin Clinic [phone] or [phone].

## 2022-07-17 DIAGNOSIS — F330 Major depressive disorder, recurrent, mild: Secondary | ICD-10-CM | POA: Diagnosis not present

## 2022-07-29 DIAGNOSIS — Z952 Presence of prosthetic heart valve: Secondary | ICD-10-CM | POA: Diagnosis not present

## 2022-07-29 DIAGNOSIS — Z7982 Long term (current) use of aspirin: Secondary | ICD-10-CM | POA: Diagnosis not present

## 2022-07-29 DIAGNOSIS — Z006 Encounter for examination for normal comparison and control in clinical research program: Secondary | ICD-10-CM | POA: Diagnosis not present

## 2022-07-29 DIAGNOSIS — Z8774 Personal history of (corrected) congenital malformations of heart and circulatory system: Secondary | ICD-10-CM | POA: Diagnosis not present

## 2022-07-29 DIAGNOSIS — Z7901 Long term (current) use of anticoagulants: Secondary | ICD-10-CM | POA: Diagnosis not present

## 2022-07-29 DIAGNOSIS — Z954 Presence of other heart-valve replacement: Secondary | ICD-10-CM | POA: Diagnosis not present

## 2022-07-29 DIAGNOSIS — Z79899 Other long term (current) drug therapy: Secondary | ICD-10-CM | POA: Diagnosis not present

## 2022-07-29 DIAGNOSIS — I351 Nonrheumatic aortic (valve) insufficiency: Secondary | ICD-10-CM | POA: Diagnosis not present

## 2022-07-29 DIAGNOSIS — Z9889 Other specified postprocedural states: Secondary | ICD-10-CM | POA: Diagnosis not present

## 2022-07-29 DIAGNOSIS — Z48812 Encounter for surgical aftercare following surgery on the circulatory system: Secondary | ICD-10-CM | POA: Diagnosis not present

## 2022-07-30 DIAGNOSIS — F330 Major depressive disorder, recurrent, mild: Secondary | ICD-10-CM | POA: Diagnosis not present

## 2022-08-05 ENCOUNTER — Other Ambulatory Visit: Payer: Self-pay | Admitting: Cardiology

## 2022-08-05 DIAGNOSIS — Z5181 Encounter for therapeutic drug level monitoring: Secondary | ICD-10-CM

## 2022-08-05 MED ORDER — WARFARIN SODIUM 7.5 MG PO TABS: ORAL_TABLET | ORAL | 0 refills | Status: DC

## 2022-08-07 ENCOUNTER — Ambulatory Visit: Payer: BC Managed Care – PPO | Attending: Cardiology

## 2022-08-07 DIAGNOSIS — Z7901 Long term (current) use of anticoagulants: Secondary | ICD-10-CM

## 2022-08-07 DIAGNOSIS — L309 Dermatitis, unspecified: Secondary | ICD-10-CM | POA: Diagnosis not present

## 2022-08-07 DIAGNOSIS — Z952 Presence of prosthetic heart valve: Secondary | ICD-10-CM

## 2022-08-07 DIAGNOSIS — Z5181 Encounter for therapeutic drug level monitoring: Secondary | ICD-10-CM | POA: Diagnosis not present

## 2022-08-07 LAB — POCT INR: INR: 1.6 — AB (ref 2.0–3.0)

## 2022-08-07 NOTE — Patient Instructions (Addendum)
Description   Take 1.5 tablets today and then continue taking Warfarin 7.5mg  daily except 3.75mg  on Sundays. Recheck INR  in 4 weeks. Coumadin Clinic [phone].

## 2022-08-13 ENCOUNTER — Encounter: Payer: Self-pay | Admitting: Cardiology

## 2022-08-13 ENCOUNTER — Telehealth: Payer: Self-pay | Admitting: Cardiology

## 2022-08-13 DIAGNOSIS — J029 Acute pharyngitis, unspecified: Secondary | ICD-10-CM | POA: Diagnosis not present

## 2022-08-13 DIAGNOSIS — Z6823 Body mass index (BMI) 23.0-23.9, adult: Secondary | ICD-10-CM | POA: Diagnosis not present

## 2022-08-13 DIAGNOSIS — Z20822 Contact with and (suspected) exposure to covid-19: Secondary | ICD-10-CM | POA: Diagnosis not present

## 2022-08-13 DIAGNOSIS — Z03818 Encounter for observation for suspected exposure to other biological agents ruled out: Secondary | ICD-10-CM | POA: Diagnosis not present

## 2022-08-13 DIAGNOSIS — U071 COVID-19: Secondary | ICD-10-CM | POA: Diagnosis not present

## 2022-08-13 NOTE — Telephone Encounter (Signed)
Asked Dr. Johney Frame about this, and she said PharmD team and/or coumadin clinic will need to advise on the safety of pt taking this medication, with her cardiac hx/meds.

## 2022-08-13 NOTE — Telephone Encounter (Signed)
She should have her INR checked about a week after she recovers from [place] will decrease the effectiveness of her birth control and she will need to use a back up method.

## 2022-08-13 NOTE — Telephone Encounter (Signed)
Pt c/o medication issue:  1. Name of Medication: Paxlovid   2. How are you currently taking this medication (dosage and times per day)? Not currently taking   3. Are you having a reaction (difficulty breathing--STAT)?   4. What is your medication issue? Pt would like to know if this medication is okay to take with her current medications and after having heart surgery last month. Requesting return call.

## 2022-08-13 NOTE — Telephone Encounter (Signed)
Message below was communicated to the pt via mychart.  She is aware that I will route this message to our coumadin clinic as an FYI, so that they have for reference when the pt calls them back post covid recovery, to recheck INRs.    Junious Dresser Just now (5:07 PM)    Hey there, our Pharmacist got back with [place] about the rest of the information regarding the Paxlovid.  Below is what Marcelle Overlie Pharmacist said:  I will send this message to our Coumadin Clinic as an FYI, to let them know you will be contacting them when you recover from covid, so that they can schedule you an appt to recheck your INRs'.  Hope this is all helpful and we hope you feel better.    Thanks, Tarry Kos, Lenna Sciara D, RPH-CPP at 08/13/2022  4:59 PM      Status: Signed   She should have her INR checked about a week after she recovers from [place] will decrease the effectiveness of her birth control and she will need to use a

## 2022-08-13 NOTE — Telephone Encounter (Signed)
Paxlovid is okay to take with Warfarin; however, will route message to PharmD to determine if it is safe for pt to take with other medications/medial history.

## 2022-08-17 DIAGNOSIS — Z6823 Body mass index (BMI) 23.0-23.9, adult: Secondary | ICD-10-CM | POA: Diagnosis not present

## 2022-08-17 DIAGNOSIS — J029 Acute pharyngitis, unspecified: Secondary | ICD-10-CM | POA: Diagnosis not present

## 2022-08-18 ENCOUNTER — Telehealth: Payer: Self-pay | Admitting: Cardiology

## 2022-08-18 NOTE — Telephone Encounter (Signed)
Patient states on 11/01 she tested positive for COVID and has been on Paxlovid for the past 5 days. Patient states she was advised that being on Paxlovid may interfere with her Coumadin and she assumes she may need to be seen sooner than 11/22. She would like to know whether it is necessary to schedule a sooner appointment. Please advise.

## 2022-08-18 NOTE — Telephone Encounter (Signed)
Per pharmacist on 08/13/22:  Pt should have her INR checked about a week after she recovers from [place].   Called pt and made her aware of pharmacist advice. Rescheduled pt's anticoagulation appt for 08/26/22 to check INR. Pt verbalized understanding.

## 2022-08-26 ENCOUNTER — Ambulatory Visit: Payer: BC Managed Care – PPO | Attending: Internal Medicine

## 2022-08-26 DIAGNOSIS — Z5181 Encounter for therapeutic drug level monitoring: Secondary | ICD-10-CM

## 2022-08-26 DIAGNOSIS — Z952 Presence of prosthetic heart valve: Secondary | ICD-10-CM | POA: Diagnosis not present

## 2022-08-26 DIAGNOSIS — Z7901 Long term (current) use of anticoagulants: Secondary | ICD-10-CM

## 2022-08-26 LAB — POCT INR: INR: 1.6 — AB (ref 2.0–3.0)

## 2022-08-26 MED ORDER — WARFARIN SODIUM 7.5 MG PO TABS: ORAL_TABLET | ORAL | 1 refills | Status: DC

## 2022-08-26 NOTE — Patient Instructions (Signed)
continue taking Warfarin 7.5mg  daily except 3.75mg  on Sundays. Recheck INR  in 4 weeks. Coumadin Clinic [phone].

## 2022-08-27 DIAGNOSIS — F330 Major depressive disorder, recurrent, mild: Secondary | ICD-10-CM | POA: Diagnosis not present

## 2022-09-09 DIAGNOSIS — F330 Major depressive disorder, recurrent, mild: Secondary | ICD-10-CM | POA: Diagnosis not present

## 2022-09-23 ENCOUNTER — Ambulatory Visit: Payer: BC Managed Care – PPO | Attending: Internal Medicine | Admitting: *Deleted

## 2022-09-23 DIAGNOSIS — Z5181 Encounter for therapeutic drug level monitoring: Secondary | ICD-10-CM | POA: Diagnosis not present

## 2022-09-23 DIAGNOSIS — Z7901 Long term (current) use of anticoagulants: Secondary | ICD-10-CM

## 2022-09-23 DIAGNOSIS — Z952 Presence of prosthetic heart valve: Secondary | ICD-10-CM

## 2022-09-23 LAB — POCT INR: INR: 1.5 — AB (ref 2.0–3.0)

## 2022-09-23 NOTE — Patient Instructions (Signed)
Description   Continue taking Warfarin 7.5mg  daily except 3.75mg  on Sundays. Recheck INR in 4 weeks. Coumadin Clinic [phone].

## 2022-09-28 NOTE — Progress Notes (Signed)
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 female here for evaluation of joint pain in multiple areas. Medical history includes aortic and pulmonic valve replacement due to congenital aortic stenosis and bicuspid valve, mechanical valve on lon term coumadin anticoagulation. She has a family history of ankylosing spondylitis.  She has had issues with joint pain in multiple areas first noticed some bilateral ankle pain intermittently started a few years ago.  Since then she intermittently noticed pains most commonly in her lower extremities especially both hips and both ankles.  She worked with physical therapy sessions last year for low back pain with an improvement in symptoms while she was participating with the exercises.  Not typically associated with the visible swelling erythema or abnormal mobility.  Earlier this year she experienced severe worsening of symptoms around May with diffuse body pain and joint pain throughout her entire body.  This is painful both at rest and exacerbated by increased use or with pressure manipulation.  She was under a lot of life stress at the time but does not recall any preceding medical events or changes.  She noticed symptoms gradually improve on their own over the course of the week but since then has been experiencing more problems overall coming and going somewhat in severity.  She is not able to take NSAIDs due to long-term anticoagulation.  She takes Tylenol as needed which is partially helpful and is use topical lidocaine patches which are partially helpful.  She experiences pain and stiffness in the mornings there is some improvement during the day but often has persistent symptoms.   Not frequently awaken at night by these.  Besides the joint pains she has noticed some new facial rashes mostly on the right side with some erythema and some scaling or flaky skin changes.  These are not particularly painful or itchy but she does not recall any similar symptoms in the past.  Last year she also had an episode of a new black spot in her vision and saw the eye doctor with no abnormal findings.  She has noticed an increase in floaters in her vision since that time. She does get migraine headaches and episodic dizziness.  She does not have much trouble with constipation or diarrhea.  Labs reviewed ANA neg RF neg ESR 1 HLA-B27 neg    Activities of Daily Living:  Patient reports morning stiffness for 20 minutes.   Patient Denies nocturnal pain.  Difficulty dressing/grooming: Denies Difficulty climbing stairs: Denies Difficulty getting out of chair: Denies Difficulty using hands for taps, buttons, cutlery, and/or writing: Reports  Review of Systems  Constitutional:  Positive for fatigue.  HENT:  Negative for mouth sores and mouth dryness.   Eyes:  Negative for dryness.  Respiratory:  Positive for shortness of breath.   Cardiovascular:  Positive for chest pain and palpitations.  Gastrointestinal:  Negative for blood in stool, constipation and diarrhea.  Endocrine: Negative for increased urination.  Genitourinary:  Negative for involuntary urination.  Musculoskeletal:  Positive for joint pain, joint pain, myalgias, muscle weakness, morning stiffness, muscle tenderness and  myalgias. Negative for gait problem and joint swelling.  Skin:  Positive for rash. Negative for color change and sensitivity to sunlight.  Allergic/Immunologic: Positive for susceptible to infections.  Neurological:  Positive for dizziness and headaches.  Hematological:  Negative for swollen glands.  Psychiatric/Behavioral:  Positive for depressed mood. Negative for sleep disturbance. The patient is  nervous/anxious.     PMFS History:  Patient Active Problem List   Diagnosis Date Noted   Low back pain 09/29/2022   Polyarthralgia 09/29/2022   Hemorrhagic shock ([place]) 07/04/2021   Acute blood loss anemia    H/O heart valve replacement with mechanical valve    Hemorrhagic ovarian cyst    Anticoagulated on Coumadin    Long term current use of anticoagulant therapy 01/06/2021   Encounter for therapeutic drug monitoring 11/15/2020   Aortic valve replaced 11/15/2020   Palpitations 08/27/2020   Bicuspid aortic valve 07/13/2020   Severe aortic insufficiency 05/16/2020   (HFpEF) heart failure with preserved ejection fraction ([place]) 05/16/2020   History of aortic valve replacement 05/09/2020   Aortic aneurysm of unspecified site, without rupture ([place]) 01/24/2020   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 No pertinent family history. Past Surgical History:  Procedure Laterality Date   BENTALL PROCEDURE  05/17/2020   S/P Aortic Aneurysm repair , Redo thoracotomy at [facility] Dr Ysidro Evert   CYST EXCISION     pilonodal   [facility] Challenger Procedure     Social History   Social History Narrative   Not on file    There is no immunization history on file for this patient.   Objective: Vital Signs: BP 95/71 (BP Location: Left Arm, Patient Position: Sitting, Cuff Size: Normal)   Pulse 84   Resp 16   Ht _0  (1.6 m)   Wt 133 lb 6.4 oz (60.5 kg)   LMP 09/12/2022 (Exact Date)   BMI 23.63 kg/m    Physical Exam HENT:     Mouth/Throat:     Mouth: Mucous membranes are moist.     Pharynx: Oropharynx is clear.  Eyes:     Conjunctiva/sclera: Conjunctivae normal.  Cardiovascular:     Rate and Rhythm: Normal rate and regular rhythm.   Pulmonary:     Effort: Pulmonary effort is normal.     Breath sounds: Normal breath sounds.  Musculoskeletal:     Right lower leg: No edema.     Left lower leg: No edema.  Lymphadenopathy:     Cervical: No cervical adenopathy.  Skin:    General: Skin is warm and dry.     Findings: No rash.  Neurological:     Mental Status: She is alert.  Psychiatric:        Mood and Affect: Mood normal.      Musculoskeletal Exam:  Shoulders full ROM no tenderness or swelling Elbows full ROM no tenderness or swelling Wrists full ROM no tenderness or swelling Fingers full ROM no tenderness or swelling Tenderness to pressure over SI joints bilaterally, no tenderness at thoracic and lumbar paraspinal muscles No lateral hip tenderness to pressure, anterior hip pain provoked with FADIR and FABER with normal internal and external rotation range  of motion Knees full ROM no tenderness or swelling Ankles full ROM some pain provoked with dorsiflexion, no swelling or focal tenderness to palpation   Investigation: No additional findings.  Imaging: XR HIPS BILAT W OR W/O PELVIS 3-4 VIEWS  Result Date: 10/03/2022 X-ray bilateral hips pelvis 3 views Visualized portion of lumbar spine without significant abnormality.  SI joints appear normal bilaterally.  Hip joints appear normal bilaterally.  No erosions or abnormal calcifications seen. Impression Normal hip x-rays  XR Ankle 2 Views Right  Result Date: 10/03/2022 X-ray right ankle 2 views Tibiotalar joint space appears normal.  Midfoot joints appear normal.  No significant calcaneal enthesophytes. Normal ankle x-ray   Recent Labs: Lab Results  Component Value Date   WBC 5.4 07/01/2022   HGB 13.5 07/01/2022   PLT 197 07/01/2022   NA 137 07/01/2022   K 3.7 07/01/2022   CL 105 07/01/2022   CO2 25 07/01/2022   GLUCOSE 135 (H) 07/01/2022   BUN 7 07/01/2022   CREATININE 0.80 07/01/2022   BILITOT 0.7 12/12/2021   ALKPHOS 46 12/12/2021   AST 21  12/12/2021   ALT 12 12/12/2021   PROT 6.7 12/12/2021   ALBUMIN 4.2 12/12/2021   CALCIUM 8.7 (L) 07/01/2022    Speciality Comments: No specialty comments available.  Procedures:  No procedures performed Allergies: Ceftriaxone, Ciprofloxacin, and Lisinopril   Assessment / Plan:     Visit Diagnoses: Polyarthralgia  Chronic bilateral low back pain without sciatica - Plan: XR HIPS BILAT W OR W/O PELVIS 3-4 VIEWS, XR Ankle 2 Views Right, Sedimentation rate, C-reactive protein, Cyclic citrul peptide antibody, IgG  Physical exam is nonspecific and no peripheral joint synovitis present.  X-ray of bilateral hips and ankle are unremarkable.  Previously had some negative lab testing we will also check CCP antibody sedimentation rate and C-reactive protein today.  I see no objective evidence so far and the intermittent symptoms are not typical for inflammatory back pain from underlying disease process.  If inflammatory markers are abnormal may be beneficial to pursue more advanced imaging for confirmation.  Anticoagulated on Coumadin  Not a good candidate for long-term use of NSAID medications for joint pain due to anticoagulation.  Already using Tylenol as needed.  If pain becomes present more consistent portion of time could consider trial of SNRI or other modalities for pain relief.  Orders: Orders Placed This Encounter  Procedures   XR HIPS BILAT W OR W/O PELVIS 3-4 VIEWS   XR Ankle 2 Views Right   Sedimentation rate   C-reactive protein   Cyclic citrul peptide antibody, IgG   No orders of the defined types were placed in this encounter.    Follow-Up Instructions: Return if symptoms worsen or fail to improve.   Collier Salina, MD  Note - This record has been created using [employer].  Chart creation errors have been sought, but may not always  have been located. Such creation errors do not reflect on  the standard of medical care.

## 2022-09-29 ENCOUNTER — Ambulatory Visit (INDEPENDENT_AMBULATORY_CARE_PROVIDER_SITE_OTHER): Payer: BC Managed Care – PPO

## 2022-09-29 ENCOUNTER — Encounter: Payer: Self-pay | Admitting: Internal Medicine

## 2022-09-29 ENCOUNTER — Ambulatory Visit: Payer: BC Managed Care – PPO | Attending: Internal Medicine | Admitting: Internal Medicine

## 2022-09-29 VITALS — BP 95/71 | HR 84 | Resp 16 | Ht 63.0 in | Wt 133.4 lb

## 2022-09-29 DIAGNOSIS — M25552 Pain in left hip: Secondary | ICD-10-CM

## 2022-09-29 DIAGNOSIS — M2550 Pain in unspecified joint: Secondary | ICD-10-CM | POA: Diagnosis not present

## 2022-09-29 DIAGNOSIS — M545 Low back pain, unspecified: Secondary | ICD-10-CM | POA: Diagnosis not present

## 2022-09-29 DIAGNOSIS — Z7901 Long term (current) use of anticoagulants: Secondary | ICD-10-CM | POA: Diagnosis not present

## 2022-09-29 DIAGNOSIS — G8929 Other chronic pain: Secondary | ICD-10-CM | POA: Diagnosis not present

## 2022-09-29 DIAGNOSIS — M25571 Pain in right ankle and joints of right foot: Secondary | ICD-10-CM | POA: Diagnosis not present

## 2022-09-29 DIAGNOSIS — M25551 Pain in right hip: Secondary | ICD-10-CM

## 2022-10-02 LAB — C-REACTIVE PROTEIN: CRP: 0.2 mg/L (ref <8.0)

## 2022-10-02 LAB — CYCLIC CITRUL PEPTIDE ANTIBODY, IGG: Cyclic Citrullin Peptide Ab: <16 UNITS

## 2022-10-02 LAB — SEDIMENTATION RATE: Sed Rate: 6 mm/h (ref 0–20)

## 2022-10-16 DIAGNOSIS — L219 Seborrheic dermatitis, unspecified: Secondary | ICD-10-CM | POA: Diagnosis not present

## 2022-10-22 ENCOUNTER — Ambulatory Visit: Payer: BC Managed Care – PPO | Attending: Cardiology | Admitting: *Deleted

## 2022-10-22 DIAGNOSIS — Z952 Presence of prosthetic heart valve: Secondary | ICD-10-CM

## 2022-10-22 DIAGNOSIS — Z5181 Encounter for therapeutic drug level monitoring: Secondary | ICD-10-CM | POA: Diagnosis not present

## 2022-10-22 DIAGNOSIS — Z7901 Long term (current) use of anticoagulants: Secondary | ICD-10-CM | POA: Diagnosis not present

## 2022-10-22 DIAGNOSIS — F330 Major depressive disorder, recurrent, mild: Secondary | ICD-10-CM | POA: Diagnosis not present

## 2022-10-22 LAB — POCT INR: INR: 1.4 — AB (ref 2.0–3.0)

## 2022-10-22 NOTE — Patient Instructions (Signed)
Description   Today take 1.5 tablets then continue taking Warfarin 7.5mg  daily except 3.75mg  on Sundays. Recheck INR in 4 weeks. Coumadin Clinic [phone].

## 2022-10-29 DIAGNOSIS — Z124 Encounter for screening for malignant neoplasm of cervix: Secondary | ICD-10-CM | POA: Diagnosis not present

## 2022-10-29 DIAGNOSIS — Z01419 Encounter for gynecological examination (general) (routine) without abnormal findings: Secondary | ICD-10-CM | POA: Diagnosis not present

## 2022-10-29 DIAGNOSIS — Z6823 Body mass index (BMI) 23.0-23.9, adult: Secondary | ICD-10-CM | POA: Diagnosis not present

## 2022-11-20 ENCOUNTER — Ambulatory Visit: Payer: BC Managed Care – PPO | Attending: Cardiology | Admitting: *Deleted

## 2022-11-20 DIAGNOSIS — Z952 Presence of prosthetic heart valve: Secondary | ICD-10-CM

## 2022-11-20 DIAGNOSIS — Z7901 Long term (current) use of anticoagulants: Secondary | ICD-10-CM | POA: Diagnosis not present

## 2022-11-20 DIAGNOSIS — Z5181 Encounter for therapeutic drug level monitoring: Secondary | ICD-10-CM

## 2022-11-20 LAB — POCT INR: INR: 1.6 — AB (ref 2.0–3.0)

## 2022-11-20 NOTE — Patient Instructions (Addendum)
Description   Continue taking Warfarin 7.5mg  daily except 3.75mg  on Sundays. Recheck INR in 5 weeks. Coumadin Clinic [phone].

## 2022-11-27 DIAGNOSIS — F330 Major depressive disorder, recurrent, mild: Secondary | ICD-10-CM | POA: Diagnosis not present

## 2022-12-09 DIAGNOSIS — F330 Major depressive disorder, recurrent, mild: Secondary | ICD-10-CM | POA: Diagnosis not present

## 2022-12-24 ENCOUNTER — Ambulatory Visit: Payer: BC Managed Care – PPO | Attending: Cardiology

## 2022-12-24 DIAGNOSIS — Z5181 Encounter for therapeutic drug level monitoring: Secondary | ICD-10-CM | POA: Diagnosis not present

## 2022-12-24 DIAGNOSIS — Z952 Presence of prosthetic heart valve: Secondary | ICD-10-CM

## 2022-12-24 DIAGNOSIS — F330 Major depressive disorder, recurrent, mild: Secondary | ICD-10-CM | POA: Diagnosis not present

## 2022-12-24 DIAGNOSIS — Z7901 Long term (current) use of anticoagulants: Secondary | ICD-10-CM | POA: Diagnosis not present

## 2022-12-24 LAB — POCT INR: INR: 1.9 — AB (ref 2.0–3.0)

## 2022-12-24 NOTE — Patient Instructions (Signed)
Description   Continue taking Warfarin 7.'5mg'$  daily except 3.'75mg'$  on Sundays.  Recheck INR in 6 weeks. Coumadin Clinic [phone].

## 2023-01-08 DIAGNOSIS — F330 Major depressive disorder, recurrent, mild: Secondary | ICD-10-CM | POA: Diagnosis not present

## 2023-01-19 DIAGNOSIS — F330 Major depressive disorder, recurrent, mild: Secondary | ICD-10-CM | POA: Diagnosis not present

## 2023-01-19 DIAGNOSIS — Z3046 Encounter for surveillance of implantable subdermal contraceptive: Secondary | ICD-10-CM | POA: Diagnosis not present

## 2023-01-21 DIAGNOSIS — L814 Other melanin hyperpigmentation: Secondary | ICD-10-CM | POA: Diagnosis not present

## 2023-01-21 DIAGNOSIS — L578 Other skin changes due to chronic exposure to nonionizing radiation: Secondary | ICD-10-CM | POA: Diagnosis not present

## 2023-01-26 DIAGNOSIS — R233 Spontaneous ecchymoses: Secondary | ICD-10-CM | POA: Diagnosis not present

## 2023-02-02 DIAGNOSIS — F330 Major depressive disorder, recurrent, mild: Secondary | ICD-10-CM | POA: Diagnosis not present

## 2023-02-04 ENCOUNTER — Ambulatory Visit: Payer: BC Managed Care – PPO | Attending: Internal Medicine | Admitting: *Deleted

## 2023-02-04 DIAGNOSIS — Z7901 Long term (current) use of anticoagulants: Secondary | ICD-10-CM

## 2023-02-04 DIAGNOSIS — Z5181 Encounter for therapeutic drug level monitoring: Secondary | ICD-10-CM | POA: Diagnosis not present

## 2023-02-04 DIAGNOSIS — Z952 Presence of prosthetic heart valve: Secondary | ICD-10-CM | POA: Diagnosis not present

## 2023-02-04 LAB — POCT INR: INR: 1.7 — AB (ref 2.0–3.0)

## 2023-02-04 NOTE — Patient Instructions (Signed)
Description   Continue taking Warfarin 7.5mg daily except 3.75mg on Sundays.  Recheck INR in 6 weeks. Coumadin Clinic [phone].       

## 2023-02-19 DIAGNOSIS — F330 Major depressive disorder, recurrent, mild: Secondary | ICD-10-CM | POA: Diagnosis not present

## 2023-02-23 DIAGNOSIS — I371 Nonrheumatic pulmonary valve insufficiency: Secondary | ICD-10-CM | POA: Diagnosis not present

## 2023-02-23 DIAGNOSIS — I351 Nonrheumatic aortic (valve) insufficiency: Secondary | ICD-10-CM | POA: Diagnosis not present

## 2023-02-23 DIAGNOSIS — Z952 Presence of prosthetic heart valve: Secondary | ICD-10-CM | POA: Diagnosis not present

## 2023-02-23 DIAGNOSIS — I7121 Aneurysm of the ascending aorta, without rupture: Secondary | ICD-10-CM | POA: Diagnosis not present

## 2023-02-25 DIAGNOSIS — I351 Nonrheumatic aortic (valve) insufficiency: Secondary | ICD-10-CM | POA: Diagnosis not present

## 2023-03-04 DIAGNOSIS — F330 Major depressive disorder, recurrent, mild: Secondary | ICD-10-CM | POA: Diagnosis not present

## 2023-03-18 ENCOUNTER — Ambulatory Visit: Payer: BC Managed Care – PPO | Attending: Cardiology

## 2023-03-18 DIAGNOSIS — Z5181 Encounter for therapeutic drug level monitoring: Secondary | ICD-10-CM

## 2023-03-18 DIAGNOSIS — Z952 Presence of prosthetic heart valve: Secondary | ICD-10-CM

## 2023-03-18 DIAGNOSIS — Z7901 Long term (current) use of anticoagulants: Secondary | ICD-10-CM | POA: Diagnosis not present

## 2023-03-18 LAB — POCT INR: INR: 1.5 — AB (ref 2.0–3.0)

## 2023-03-18 NOTE — Patient Instructions (Signed)
Description   Take 1.5 tablets today and then continue taking Warfarin 7.5mg  daily except 3.75mg  on Sundays.  Recheck INR in 6 weeks.  Coumadin Clinic [phone]

## 2023-04-13 ENCOUNTER — Other Ambulatory Visit: Payer: Self-pay

## 2023-04-13 DIAGNOSIS — Z5181 Encounter for therapeutic drug level monitoring: Secondary | ICD-10-CM

## 2023-04-13 MED ORDER — WARFARIN SODIUM 7.5 MG PO TABS: ORAL_TABLET | ORAL | 0 refills | Status: DC

## 2023-04-13 NOTE — Telephone Encounter (Signed)
Prescription refill request received for warfarin Lov: 03/07/22 Shari Prows)  Next INR check: 7/18 Warfarin tablet strength: 7.5mg   Office visit overdue. Note placed on upcoming coumadin clinic appt. Refill sent.

## 2023-04-30 ENCOUNTER — Ambulatory Visit: Payer: BC Managed Care – PPO | Attending: Cardiology

## 2023-04-30 DIAGNOSIS — Z7901 Long term (current) use of anticoagulants: Secondary | ICD-10-CM | POA: Diagnosis not present

## 2023-04-30 DIAGNOSIS — Z952 Presence of prosthetic heart valve: Secondary | ICD-10-CM | POA: Diagnosis not present

## 2023-04-30 DIAGNOSIS — Z5181 Encounter for therapeutic drug level monitoring: Secondary | ICD-10-CM

## 2023-04-30 LAB — POCT INR: INR: 1.5 — AB (ref 2.0–3.0)

## 2023-04-30 NOTE — Patient Instructions (Signed)
Description   Take 11.25mg  today and then continue taking Warfarin 7.5mg  daily except 3.75mg  on Sundays.  Recheck INR in 6 weeks.  Coumadin Clinic [phone]

## 2023-06-03 DIAGNOSIS — H5213 Myopia, bilateral: Secondary | ICD-10-CM | POA: Diagnosis not present

## 2023-06-03 DIAGNOSIS — D3101 Benign neoplasm of right conjunctiva: Secondary | ICD-10-CM | POA: Diagnosis not present

## 2023-06-11 ENCOUNTER — Ambulatory Visit: Payer: BC Managed Care – PPO | Attending: Cardiology | Admitting: *Deleted

## 2023-06-11 DIAGNOSIS — Z5181 Encounter for therapeutic drug level monitoring: Secondary | ICD-10-CM | POA: Diagnosis not present

## 2023-06-11 DIAGNOSIS — Z952 Presence of prosthetic heart valve: Secondary | ICD-10-CM

## 2023-06-11 DIAGNOSIS — Z7901 Long term (current) use of anticoagulants: Secondary | ICD-10-CM | POA: Diagnosis not present

## 2023-06-11 LAB — POCT INR: INR: 1.8 — AB (ref 2.0–3.0)

## 2023-06-11 NOTE — Patient Instructions (Addendum)
Description   Continue taking Warfarin 7.5mg  daily except 3.75mg  on Sundays.  Recheck INR in 6 weeks. CALL THE OFFICE TO SCHEDULE AN APPT WITH CARDIOLOGIST. Coumadin Clinic [phone]

## 2023-07-17 ENCOUNTER — Telehealth: Payer: Self-pay | Admitting: Cardiovascular Disease

## 2023-07-17 DIAGNOSIS — Z5181 Encounter for therapeutic drug level monitoring: Secondary | ICD-10-CM

## 2023-07-17 MED ORDER — WARFARIN SODIUM 7.5 MG PO TABS: ORAL_TABLET | ORAL | 0 refills | Status: DC

## 2023-07-17 NOTE — Telephone Encounter (Signed)
*  STAT* If patient is at the pharmacy, call can be transferred to refill team.   1. Which medications need to be refilled? (please list name of each medication and dose if known)? warfarin (COUMADIN) 7.5 MG tablet  2. Which pharmacy/location (including street and city if local pharmacy) is medication to be sent to? [place], [place]  3. Do they need a 30 day or 90 day supply? 90 day

## 2023-07-17 NOTE — Telephone Encounter (Signed)
Prescription refill request received for warfarin Lov: 03/07/22 Gloria Hamilton)  Next INR check: 07/23/23 Warfarin tablet strength: 7.5mg   Appropriate dose. Office visit overdue. Pt has scheduled appt with Dr Eden Emms on 07/24/23. Refill sent.

## 2023-07-18 NOTE — Progress Notes (Signed)
Cardiology Office Note:    Date:  07/24/2023   ID:  Pincus Sanes, DOB 03/11/1996, MRN [id number]  PCP:  Aliene Beams, MD  [facility] Cardiologist:  Meriam Sprague, MD  [facility] Electrophysiologist:  None   Referring MD: Aliene Beams, MD    History of Present Illness:    Gloria Hamilton is a 27 y.o. female with a hx of bicuspid aortic valve with aortic root dilation s/p mechanical AVR and root repair 06/06/20 on warfarin with goal INR 1.5-2.5, prior Ross procedure She is new to me having been seen by Dr Shari Prows previously   TTE 03/27/22 EF 55-60% no AR mean gradient 3 mmHg Pulmonary homograft with mean gradient 8 peak 14 mmHg severe PR On X valve with INR goal 1.5-2   Cardiopulmonary stress test 04/24/22 showed deconditioning with blunted BP restrictive lung dx and some CO/circulatory limitation   She has tended to be tachycardic and is on Toprol  Hospitalized 06/2021 with pelvic hematoma and hemoperitoneum ? From ruptured ovarian cyst   Mother has ankylosing spondylosis She has chronic back pain and has seen rheumatology ANA,RF,ESR and HLA-B27 negative Cannot use chronic NSAI's due to coumadin. Tylenol with ? Adding SNRI per rheum F/U Dr Dimple Casey  She has been seeing Dr Liborio Nixon She had a stent valve to her PV and has f/u echo and MRI/MRA ordered Those records not in Care Everywhere yet  She has a boyfriend 8 years. Has implant for contraception. Works at [email or web address]   Past Medical History:  Diagnosis Date   Anxiety    Bicuspid aortic valve    CHF (congestive heart failure) (HCC)    Congenital aortic insufficiency    H/O aortic valve replacement    Heart murmur    Migraines    Transposition of great vessels     Past Surgical History:  Procedure Laterality Date   BENTALL PROCEDURE  05/17/2020   S/P Aortic Aneurysm repair , Redo thoracotomy at [facility] Dr Kizzie Bane   CYST EXCISION     pilonodal   [facility] Craw Procedure      Current Medications: Current Meds   Medication Sig   acetaminophen (TYLENOL) 325 MG tablet Take 650 mg by mouth every 6 (six) hours as needed for mild pain.   aspirin 81 MG chewable tablet Chew 81 mg by mouth daily.   metoprolol succinate (TOPROL XL) 25 MG 24 hr tablet Take 0.5 tablets (12.5 mg total) by mouth every evening. (Patient taking differently: Take 12.5 mg by mouth daily.)   norethindrone (CAMILA) 0.35 MG tablet Take 1 tablet (0.35 mg total) by mouth daily.   SODIUM FLUORIDE 5000 PLUS 1.1 % CREA dental cream Place 1 Application onto teeth every other day.   warfarin (COUMADIN) 7.5 MG tablet TAKE 1/2 TABLET TO 1 TABLET BY MOUTH DAILY OR AS DIRECTED BY COUMADIN CLINIC     Allergies:   Ceftriaxone, Ciprofloxacin, and Lisinopril   Social History   Socioeconomic History   Marital status: Significant Other    Spouse name: Not on file   Number of children: 0   Years of education: 50   Highest education level: Not on file  Occupational History   Not on file  Tobacco Use   Smoking status: Never   Smokeless tobacco: Never  Vaping Use   Vaping status: Never Used  Substance and Sexual Activity   Alcohol use: No   Drug use: No   Sexual activity: Not on file  Other Topics Concern   Not  on file  Social History Narrative   Not on file   Social Determinants of Health   Financial Resource Strain: Not on file  Food Insecurity: Not on file  Transportation Needs: Not on file  Physical Activity: Not on file  Stress: Not on file  Social Connections: Not on file     Family History: The patient's family history is not on file.  ROS:   Please see the history of present illness.    Review of Systems  Constitutional:  Negative for chills and fever.  HENT:  Negative for nosebleeds.   Eyes:  Negative for blurred vision.  Respiratory:  Negative for shortness of breath.   Cardiovascular:  Positive for palpitations. Negative for chest pain, orthopnea, claudication, leg swelling and PND.  Gastrointestinal:  Negative  for blood in stool, melena, nausea and vomiting.  Genitourinary:  Negative for hematuria.  Musculoskeletal:  Negative for falls.  Neurological:  Negative for dizziness and loss of consciousness.  Endo/Heme/Allergies:  Negative for polydipsia.  Psychiatric/Behavioral:  Negative for substance abuse.     EKGs/Labs/Other Studies Reviewed:    The following studies were reviewed today: CMR 07/22/2021: MRI 03/07/2021 1. The left ventricle is normal in cavity size and wall thickness. Global systolic function is normal with  an LV ejection fraction calculated at 58%. There are no regional wall motion abnormalities.   2. The right ventricle is normal in cavity size, wall thickness, and systolic function.   3. The right atrium is mildly enlarged. The left atrium is normal in size.   4. There is a mechanical prosthetic valve in the aortic position. The leaflets are not well visualized due  to metal artifacts. However, the valve appears to be well-seated and leaflets motion appear to be normal.  Peak aortic flow velocity is 1.2 m/s. There is trivial aortic regurgitation. These findings are consistent  with a normal functioning prosthetic valve.   5. There is moderate pulmonic regurgitation. There is also mild tricuspid regurgitation. There are no other  significant valvular lesions.   6. Delayed enhancement imaging demonstrates no evidence of myocardial infarction, scar or infiltrative  disease.   7.  No intracardiac thrombus visualized.  TTE 09/26/20: Echo complete  Result Value Ref Range  LV Ejection Fraction (%) 40  Aortic Valve Regurgitation Grade trivial  Aortic Valve Stenosis Grade mechanical prosthetic  Aortic Valve Max Velocity (m/s) 1 m/sec  Aortic Valve Stenosis Mean Gradient (mmHg) 2.3 mmHg  Mitral Valve Regurgitation Grade mild  Mitral Valve Stenosis Grade none  Tricuspid Valve Regurgitation Grade mild  Tricuspid Valve Regurgitation Max Velocity (m/s) 2.3 m/sec  Right Ventricle  Systolic Pressure (mmHg) 27.0 mmHg  LV End Diastolic Diameter (cm) 4.6 cm  LV End Systolic Diameter (cm) 3.4 cm  LV Septum Wall Thickness (cm) 1.1 cm  LV Posterior Wall Thickness (cm) 0.87 cm  Left Atrium Diameter (cm) 2.7 cm   Cardiac Monitor 12/07/20: Patch wear time was 13 days. Predominant rhythm was normal sinus rhythm with average HR 92; HR ranged from 64-151bpm. Rare SVE (<1%), rare PVC (<1%) There is no SVT, NSVT/VT, Afib or significant pauses Overall, no significant arrhythmias detected on cardiac monitor.    EKG:  No ECG performed today  Recent Labs: No results found for requested labs within last 365 days.  Recent Lipid Panel No results found for: "CHOL", "TRIG", "HDL", "CHOLHDL", "VLDL", "LDLCALC", "LDLDIRECT"    Physical Exam:    VS:  Pulse 75   Ht 5\' 3"  (1.6 m)  Wt 133 lb 6.4 oz (60.5 kg)   SpO2 98%   BMI 23.63 kg/m     Wt Readings from Last 3 Encounters:  07/24/23 133 lb 6.4 oz (60.5 kg)  09/29/22 133 lb 6.4 oz (60.5 kg)  07/01/22 130 lb (59 kg)     Young female S2 click SEM no AR   Post sternotomy Lungs clear Abdomen benign No edema  ASSESSMENT:    1. Status post mechanical aortic valve replacement      PLAN:    In order of problems listed above:  #Bicuspid AoV with aortic dilation s/p mechanical AVR with root repair: -Continue warfarin with goal INR 1.5-2.5 -Continue metop 12.5mg  daily; no BP room for ARB at this time -CMR 02/2021 with EF 58% -MRA 02/2021 with normal root repair, no ascending/descending aortic dilation -Has had Ross procedure and redo AVR with On X valve Normal function by TTE 03/27/22   #Moderate PI: History of Ross procedure. PR moderate on CMR 2022.  -Severe by TTE 03/27/22   - Post Melody stent valve at [facility]  - F/U echo / MRI with Dr Mayo Ao   #Sinus Tachycardia: #Palpitations: Cardiac monitor 12/07/20 with NSR with HR 64-151 with no arrhythmias.  TTE as above with normal gradients across the valve. LVEF  improved to 58% by CMR. Stress test normal. Symptoms significantly improved with metoprolol -Continue metop 12.5mg  daily  #Chronic AC: On warfarin for mechanical AoV as above. Goal INR 1.5-2.5  -Continue warfarin; follows with [facility] clinic  #Rupture of Ovarian Cyst with Resultant Anemia: Occurred in 06/2021 with resultant anemia and required warfarin reversal. Was managed conservatively and INR was reversed. She did well and was resumed on warfarin without issues.   F/U in a year     Signed, Charlton Haws, MD  07/24/2023 9:18 AM    [facility]

## 2023-07-23 ENCOUNTER — Ambulatory Visit: Payer: BC Managed Care – PPO | Attending: Internal Medicine

## 2023-07-23 DIAGNOSIS — Z7901 Long term (current) use of anticoagulants: Secondary | ICD-10-CM | POA: Diagnosis not present

## 2023-07-23 DIAGNOSIS — I351 Nonrheumatic aortic (valve) insufficiency: Secondary | ICD-10-CM | POA: Diagnosis not present

## 2023-07-23 DIAGNOSIS — Z952 Presence of prosthetic heart valve: Secondary | ICD-10-CM

## 2023-07-23 DIAGNOSIS — Z954 Presence of other heart-valve replacement: Secondary | ICD-10-CM | POA: Diagnosis not present

## 2023-07-23 DIAGNOSIS — Z8774 Personal history of (corrected) congenital malformations of heart and circulatory system: Secondary | ICD-10-CM | POA: Diagnosis not present

## 2023-07-23 LAB — POCT INR: INR: 1.6 — AB (ref 2.0–3.0)

## 2023-07-23 NOTE — Patient Instructions (Signed)
Description   Continue taking Warfarin 7.5mg  daily except 3.75mg  on Sundays.  Recheck INR in 6 weeks. CALL THE OFFICE TO SCHEDULE AN APPT WITH CARDIOLOGIST. Coumadin Clinic [phone]

## 2023-07-24 ENCOUNTER — Encounter: Payer: Self-pay | Admitting: Cardiovascular Disease

## 2023-07-24 ENCOUNTER — Ambulatory Visit: Payer: BC Managed Care – PPO | Admitting: Cardiovascular Disease

## 2023-07-24 VITALS — HR 75 | Ht 63.0 in | Wt 133.4 lb

## 2023-07-24 DIAGNOSIS — Z952 Presence of prosthetic heart valve: Secondary | ICD-10-CM

## 2023-07-24 DIAGNOSIS — I371 Nonrheumatic pulmonary valve insufficiency: Secondary | ICD-10-CM

## 2023-07-24 DIAGNOSIS — Z7901 Long term (current) use of anticoagulants: Secondary | ICD-10-CM | POA: Diagnosis not present

## 2023-07-24 NOTE — Patient Instructions (Signed)
Medication Instructions:  Your physician recommends that you continue on your current medications as directed. Please refer to the Current Medication list given to you today.  *If you need a refill on your cardiac medications before your next appointment, please call your pharmacy*  Lab Work: If you have labs (blood work) drawn today and your tests are completely normal, you will receive your results only by: MyChart Message (if you have MyChart) OR A paper copy in the mail If you have any lab test that is abnormal or we need to change your treatment, we will call you to review the results.  Follow-Up: At [facility], you and your health needs are our priority.  As part of our continuing mission to provide you with exceptional heart care, we have created designated Provider Care Teams.  These Care Teams include your primary Cardiologist (physician) and Advanced Practice Providers (APPs -  Physician Assistants and Nurse Practitioners) who all work together to provide you with the care you need, when you need it.  We recommend signing up for the patient portal called "MyChart".  Sign up information is provided on this After Visit Summary.  MyChart is used to connect with patients for Virtual Visits (Telemedicine).  Patients are able to view lab/test results, encounter notes, upcoming appointments, etc.  Non-urgent messages can be sent to your provider as well.   To learn more about what you can do with MyChart, go to [email or web address].    Your next appointment:   1 year(s)  Provider:   Peter Nishan, MD     

## 2023-08-12 ENCOUNTER — Telehealth: Payer: Self-pay | Admitting: *Deleted

## 2023-08-12 NOTE — Telephone Encounter (Signed)
   Pre-operative Risk Assessment    Patient Name: Gloria Hamilton  DOB: 1995/12/21 MRN: [id number]    DATE OF LAST VISIT: 07/24/23 DR. NISHAN DATE OF NEXT VISIT: NONE Request for Surgical Clearance    Procedure:   PER CLEARANCE FORM: SMALL DENTAL SURGERY AROUND HER UPPER MOLAR TEETH. NO TEETH ARE BEING EXTRACTED PER THE REQUEST  Date of Surgery:  Clearance 08/21/23                                Surgeon:  DR. Pilar Plate, DDS, MS Surgeon's Group or Practice Name:  [facility] Phone number:  [phone] Fax number:  [phone]   Type of Clearance Requested:   - Pharmacy:  Hold Warfarin (Coumadin) PER REQUEST WOULD ALSO LIKE INR THE DAY BEFORE OR THE MORNING OF PROCEDURE    Type of Anesthesia:   LOCAL- 2% LIDOCAINE 1:100KEPI AND 0.5 % MARCAINE 1:200KEPI   Additional requests/questions:    Elpidio Anis   08/12/2023, 6:22 PM

## 2023-08-13 NOTE — Telephone Encounter (Signed)
Pre-op team,   Will you please contact requesting office and determine what they want INR to be so Pharmacy can determine how long of a hold the patient will need?   Thank you!  DW

## 2023-08-13 NOTE — Telephone Encounter (Signed)
Please advise holding coumadin prior to dental surgery (not an extraction). Per request, would like INR day before or morning of procedure (08/21/2023).  Thank you!  DW

## 2023-08-13 NOTE — Telephone Encounter (Signed)
Can we find out what they want the INR to be? If they only need the INR to be <2 then patient may be able to hold less than 5 days.

## 2023-08-14 NOTE — Telephone Encounter (Signed)
Left message for the dental office to call back as pharm-d is asking if Dr. Ander Purpura, DDS has a specific range the INR needs to be in.

## 2023-08-17 MED ORDER — AMOXICILLIN 500 MG PO CAPS: ORAL_CAPSULE | ORAL | 0 refills | Status: AC

## 2023-08-17 NOTE — Telephone Encounter (Signed)
Dental office called about INR range. Confirmed Dr. Ander Purpura, DDS would like INR to be < 3. I will update the pre op team and pre op pharm-d.

## 2023-08-17 NOTE — Addendum Note (Signed)
Addended by: Malena Peer D on: 08/17/2023 12:11 PM   Modules accepted: Orders

## 2023-08-17 NOTE — Telephone Encounter (Signed)
Patient with diagnosis of On-X aortic valve on warfarin for anticoagulation.    Procedure: SMALL DENTAL SURGERY AROUND HER UPPER MOLAR TEETH. NO TEETH ARE BEING EXTRACTED PER THE REQUEST  Date of procedure: 08/21/23  Patient DOES require pre-op antibiotics for dental procedure.  Patient INR goal if 1.5-2.5. Dentist requesting that INR be <3. There is no need to hold warfarin. She will need INR checked 11/8 or 11/7. I will send a message to coumadin clinic as her apt will need to be moved up.  **This guidance is not considered finalized until pre-operative APP has relayed final recommendations.**

## 2023-08-17 NOTE — Telephone Encounter (Signed)
   Name: Gloria Hamilton  DOB: 27-Feb-1996  MRN: [id number]   Primary Cardiologist: Charlton Haws, MD  Chart reviewed as part of pre-operative protocol coverage.   Per Pharm D: "Patient DOES require pre-op antibiotics for dental procedure.   Patient INR goal if 1.5-2.5. Dentist requesting that INR be <3. There is no need to hold warfarin. She will need INR checked 11/8 or 11/7. I will send a message to coumadin clinic as her apt will need to be moved up."  Rx for amoxicillin sent to pharmacy.   I will route this recommendation to the requesting party via Epic fax function and remove from pre-op pool. Please call with questions.  Carlos Levering, NP 08/17/2023, 12:38 PM

## 2023-08-19 DIAGNOSIS — F411 Generalized anxiety disorder: Secondary | ICD-10-CM | POA: Diagnosis not present

## 2023-08-20 ENCOUNTER — Ambulatory Visit: Payer: BC Managed Care – PPO | Attending: Cardiology

## 2023-08-20 DIAGNOSIS — Z952 Presence of prosthetic heart valve: Secondary | ICD-10-CM | POA: Diagnosis not present

## 2023-08-20 DIAGNOSIS — Z7901 Long term (current) use of anticoagulants: Secondary | ICD-10-CM | POA: Diagnosis not present

## 2023-08-20 LAB — POCT INR: INR: 1.8 — AB (ref 2.0–3.0)

## 2023-08-20 NOTE — Patient Instructions (Signed)
Description   Continue taking Warfarin 7.5mg  daily except 3.75mg  on Sundays.  Recheck INR in 6 weeks. CALL THE OFFICE TO SCHEDULE AN APPT WITH CARDIOLOGIST. Coumadin Clinic [phone]

## 2023-08-28 ENCOUNTER — Telehealth: Payer: Self-pay | Admitting: Cardiovascular Disease

## 2023-08-28 NOTE — Telephone Encounter (Signed)
Called patient back about her message. Patient complaining of fluttering that comes and goes. Patient is having some issues with her mouth after dental work. Patient has called her dentist about this and they have started her on another antibiotic. Informed patient that her fluttering might be due to her being stressed about the dental work and complications. Encouraged patient to drink fluids since her BP is 98/68. Patient's HR is 87. Patient is currently taking metoprolol 25 mg daily. Informed patient that we could get her in early next week to see a provider. Patient  would like to see how she does over the weekend and call on Monday, if she needs to be seen. Encouraged patient to go to ED if her BP does not improve and her fluttering gets worse. Will send message to Dr. Eden Emms for further advisement.

## 2023-08-28 NOTE — Telephone Encounter (Signed)
Patient c/o Palpitations:  High priority if patient c/o lightheadedness, shortness of breath, or chest pain  How long have you had palpitations/irregular HR/ Afib? Are you having the symptoms now? 3-5 days   Are you currently experiencing lightheadedness, SOB or CP? No   Do you have a history of afib (atrial fibrillation) or irregular heart rhythm? Yes   Have you checked your BP or HR? (document readings if available): N/A   Are you experiencing any other symptoms?  Patient is experiencing slight flutters after having dental work done. She also states that there is a spot in her mouth that causing concern and would like to speak with someone in regards to this.

## 2023-09-22 DIAGNOSIS — R300 Dysuria: Secondary | ICD-10-CM | POA: Diagnosis not present

## 2023-09-22 DIAGNOSIS — N3001 Acute cystitis with hematuria: Secondary | ICD-10-CM | POA: Diagnosis not present

## 2023-09-30 ENCOUNTER — Other Ambulatory Visit: Payer: Self-pay

## 2023-09-30 DIAGNOSIS — I509 Heart failure, unspecified: Secondary | ICD-10-CM | POA: Diagnosis not present

## 2023-09-30 DIAGNOSIS — N83202 Unspecified ovarian cyst, left side: Secondary | ICD-10-CM | POA: Diagnosis not present

## 2023-09-30 DIAGNOSIS — Z7901 Long term (current) use of anticoagulants: Secondary | ICD-10-CM | POA: Diagnosis not present

## 2023-09-30 DIAGNOSIS — R1030 Lower abdominal pain, unspecified: Secondary | ICD-10-CM | POA: Diagnosis not present

## 2023-09-30 DIAGNOSIS — Z7982 Long term (current) use of aspirin: Secondary | ICD-10-CM | POA: Diagnosis not present

## 2023-09-30 DIAGNOSIS — R9431 Abnormal electrocardiogram [ECG] [EKG]: Secondary | ICD-10-CM | POA: Diagnosis not present

## 2023-10-01 ENCOUNTER — Emergency Department (HOSPITAL_BASED_OUTPATIENT_CLINIC_OR_DEPARTMENT_OTHER): Payer: BC Managed Care – PPO

## 2023-10-01 ENCOUNTER — Encounter (HOSPITAL_BASED_OUTPATIENT_CLINIC_OR_DEPARTMENT_OTHER): Payer: Self-pay

## 2023-10-01 ENCOUNTER — Ambulatory Visit: Payer: BC Managed Care – PPO

## 2023-10-01 ENCOUNTER — Emergency Department (HOSPITAL_BASED_OUTPATIENT_CLINIC_OR_DEPARTMENT_OTHER)
Admission: EM | Admit: 2023-10-01 | Discharge: 2023-10-01 | Disposition: A | Discharge status: Home / Self Care | Payer: BC Managed Care – PPO | Attending: Emergency Medicine | Admitting: Emergency Medicine

## 2023-10-01 ENCOUNTER — Other Ambulatory Visit: Payer: Self-pay

## 2023-10-01 DIAGNOSIS — R102 Pelvic and perineal pain: Secondary | ICD-10-CM | POA: Diagnosis not present

## 2023-10-01 DIAGNOSIS — N83202 Unspecified ovarian cyst, left side: Secondary | ICD-10-CM

## 2023-10-01 LAB — COMPREHENSIVE METABOLIC PANEL
ALT: 8 U/L (ref 0–44)
AST: 15 U/L (ref 15–41)
Albumin: 4.4 g/dL (ref 3.5–5.0)
Alkaline Phosphatase: 42 U/L (ref 38–126)
Anion gap: 6 (ref 5–15)
BUN: 8 mg/dL (ref 6–20)
CO2: 27 mmol/L (ref 22–32)
Calcium: 8.8 mg/dL — ABNORMAL LOW (ref 8.9–10.3)
Chloride: 105 mmol/L (ref 98–111)
Creatinine, Ser: 0.7 mg/dL (ref 0.44–1.00)
GFR, Estimated: >60 mL/min (ref >60)
Glucose, Bld: 101 mg/dL — ABNORMAL HIGH (ref 70–99)
Potassium: 3.9 mmol/L (ref 3.5–5.1)
Sodium: 138 mmol/L (ref 135–145)
Total Bilirubin: 0.4 mg/dL (ref <1.2)
Total Protein: 7 g/dL (ref 6.5–8.1)

## 2023-10-01 LAB — PROTIME-INR
INR: 1.4 — ABNORMAL HIGH (ref 0.8–1.2)
Prothrombin Time: 17 s — ABNORMAL HIGH (ref 11.4–15.2)

## 2023-10-01 LAB — CBC
HCT: 37.9 % (ref 36.0–46.0)
Hemoglobin: 12.4 g/dL (ref 12.0–15.0)
MCH: 30 pg (ref 26.0–34.0)
MCHC: 32.7 g/dL (ref 30.0–36.0)
MCV: 91.5 fL (ref 80.0–100.0)
Platelets: 225 10*3/uL (ref 150–400)
RBC: 4.14 MIL/uL (ref 3.87–5.11)
RDW: 13.1 % (ref 11.5–15.5)
WBC: 5.8 10*3/uL (ref 4.0–10.5)
nRBC: 0 % (ref 0.0–0.2)

## 2023-10-01 LAB — URINALYSIS, ROUTINE W REFLEX MICROSCOPIC
Bacteria, UA: NONE SEEN
Bilirubin Urine: NEGATIVE
Glucose, UA: NEGATIVE mg/dL
Ketones, ur: NEGATIVE mg/dL
Leukocytes,Ua: NEGATIVE
Nitrite: NEGATIVE
Protein, ur: NEGATIVE mg/dL
Specific Gravity, Urine: 1.006 (ref 1.005–1.030)
pH: 7 (ref 5.0–8.0)

## 2023-10-01 LAB — LIPASE, BLOOD: Lipase: 42 U/L (ref 11–51)

## 2023-10-01 LAB — PREGNANCY, URINE: Preg Test, Ur: NEGATIVE

## 2023-10-01 NOTE — ED Triage Notes (Signed)
Pt coming in with report of concerns for ruptured ovarian cyst. Symptoms include severe lower abdominal pain, worse with movement and palpation. Pt being treated for UTI with strep in urine. Pt states she is on Warfarin for mechanical for a mechanical heart valve. Pt reports history of ruptured ovarian cysts that felt similar to current pain. Pain 7/10 yesterday and mildly improved today. Pt also reports lower abdominal bloating. NAD noted in triage. Pt denies fevers.

## 2023-10-01 NOTE — ED Notes (Signed)
MD made aware of patient bp. Pt is asymptomatic.Pt reports she usually runs in the 90's, Pt is on metoprolol and took her dose this morning.

## 2023-10-01 NOTE — ED Triage Notes (Signed)
Pain radiating into lower back yesterday, less today.

## 2023-10-01 NOTE — ED Triage Notes (Signed)
Pt reports feeling a heart fluttering and CP briefly last night, not ongoing today.

## 2023-10-01 NOTE — ED Provider Notes (Signed)
[place] EMERGENCY DEPARTMENT AT [facility] Provider Note   CSN: [id number] Arrival date & time: 09/30/23  2357     History  Chief Complaint  Patient presents with   Abdominal Pain    Gloria Hamilton is a 27 y.o. female.  HPI     This is a 27 year old female who presents with concern for lower abdominal pain.  She is concerned she has a recurrent ovarian cyst.  She is on Coumadin for a mechanical valve.  She states she began to have some pain yesterday.  It did not necessarily lateralize and has improved somewhat but it is similar to her prior ovarian cyst.  Denies urinary symptoms such as dysuria hematuria.  Denies vaginal discharge or concerns for STDs.  Home Medications Prior to Admission medications   Medication Sig Start Date End Date Taking? Authorizing Provider  acetaminophen (TYLENOL) 325 MG tablet Take 650 mg by mouth every 6 (six) hours as needed for mild pain.    [provider]  amoxicillin (AMOXIL) 500 MG capsule Take 4 capsules by mouth 30-60 min prior to dental procedure. 08/17/23   Wendall Stade, MD  aspirin 81 MG chewable tablet Chew 81 mg by mouth daily.    [provider]  docusate sodium (COLACE) 100 MG capsule Take 100 mg by mouth 2 (two) times daily as needed for mild constipation. Patient not taking: Reported on 09/29/2022    [provider]  enoxaparin (LOVENOX) 60 MG/0.6ML injection Inject 60 mg into the skin every 12 (twelve) hours. Patient not taking: Reported on 09/29/2022 05/29/22   [provider]  metoprolol succinate (TOPROL XL) 25 MG 24 hr tablet Take 0.5 tablets (12.5 mg total) by mouth every evening. Patient taking differently: Take 12.5 mg by mouth daily. 03/07/22   Meriam Sprague, MD  nitrofurantoin, macrocrystal-monohydrate, (MACROBID) 100 MG capsule Take 1 capsule (100 mg total) by mouth 2 (two) times daily. Patient not taking: Reported on 09/29/2022 12/12/21   Raspet, Noberto Retort, PA-C   norethindrone (CAMILA) 0.35 MG tablet Take 1 tablet (0.35 mg total) by mouth daily. 07/16/18   Rasch, Victorino Dike I, NP  polyethylene glycol (MIRALAX / GLYCOLAX) 17 g packet Take 17 g by mouth daily as needed for mild constipation. Patient not taking: Reported on 09/29/2022    [provider]  SODIUM FLUORIDE 5000 PLUS 1.1 % CREA dental cream Place 1 Application onto teeth every other day. 06/04/22   [provider]  warfarin (COUMADIN) 1 MG tablet Take 2 tablets (2mg ) by mouth daily along with your warfarin 7.5mg  tablet  as instructed by the Anticoagulation Clinic. Patient not taking: Reported on 07/04/2022 10/23/21   Meriam Sprague, MD  warfarin (COUMADIN) 7.5 MG tablet TAKE 1/2 TABLET TO 1 TABLET BY MOUTH DAILY OR AS DIRECTED BY COUMADIN CLINIC 07/17/23   Wendall Stade, MD      Allergies    Ceftriaxone, Ciprofloxacin, and Lisinopril    Review of Systems   Review of Systems  Constitutional:  Negative for fever.  Respiratory:  Negative for shortness of breath.   Cardiovascular:  Negative for chest pain.  Gastrointestinal:  Positive for abdominal pain. Negative for nausea and vomiting.  Genitourinary:  Positive for vaginal bleeding. Negative for dysuria and vaginal discharge.  All other systems reviewed and are negative.   Physical Exam Updated Vital Signs BP (!) 86/64   Pulse 70   Temp (!) 97.5 F (36.4 C)   Resp 18   Ht 1.6 m (  5\' 3" )   Wt 61.7 kg   SpO2 100%   BMI 24.09 kg/m  Physical Exam Vitals and nursing note reviewed.  Constitutional:      Appearance: She is well-developed.  HENT:     Head: Normocephalic and atraumatic.  Eyes:     Pupils: Pupils are equal, round, and reactive to light.  Cardiovascular:     Rate and Rhythm: Normal rate and regular rhythm.     Heart sounds: Murmur heard.  Pulmonary:     Effort: Pulmonary effort is normal. No respiratory distress.     Breath sounds: No wheezing.  Abdominal:     General: Bowel sounds are normal.      Palpations: Abdomen is soft.     Tenderness: There is no abdominal tenderness. There is no guarding or rebound.  Genitourinary:    Comments: Pelvic exam deferred Musculoskeletal:     Cervical back: Neck supple.  Skin:    General: Skin is warm and dry.  Neurological:     Mental Status: She is alert and oriented to person, place, and time.  Psychiatric:        Mood and Affect: Mood normal.     ED Results / Procedures / Treatments   Labs (all labs ordered are listed, but only abnormal results are displayed) Labs Reviewed  COMPREHENSIVE METABOLIC PANEL - Abnormal; Notable for the following components:      Result Value   Glucose, Bld 101 (*)    Calcium 8.8 (*)    All other components within normal limits  URINALYSIS, ROUTINE W REFLEX MICROSCOPIC - Abnormal; Notable for the following components:   Color, Urine COLORLESS (*)    Hgb urine dipstick MODERATE (*)    All other components within normal limits  PROTIME-INR - Abnormal; Notable for the following components:   Prothrombin Time 17.0 (*)    INR 1.4 (*)    All other components within normal limits  LIPASE, BLOOD  CBC  PREGNANCY, URINE    EKG EKG Interpretation Date/Time:  Thursday October 01 2023 00:47:41 EST Ventricular Rate:  78 PR Interval:  134 QRS Duration:  80 QT Interval:  394 QTC Calculation: 449 R Axis:   98  Text Interpretation: Normal sinus rhythm Rightward axis Cannot rule out Anterior infarct (cited on or before 24-Jul-2023) Abnormal ECG When compared with ECG of 24-Jul-2023 09:07, No significant change was found Confirmed by Ross Marcus ([id number]) on 10/01/2023 1:29:04 AM  Radiology US Pelvis Complete Result Date: 10/01/2023 CLINICAL DATA:  Two day history of pelvic pain exacerbated with movement. EXAM: TRANSABDOMINAL ULTRASOUND OF PELVIS TECHNIQUE: Transabdominal ultrasound examination of the pelvis was performed including evaluation of the uterus, ovaries, adnexal regions, and pelvic  cul-de-sac. COMPARISON:  None Available. FINDINGS: Uterus Measurements: 8.4 x 4.4 x 5.4 cm = volume: 103.9 mL. No fibroids or other mass visualized. Endometrium Thickness: 8.6 mm.  No focal abnormality visualized. Right ovary Measurements: 3.0 x 1.8 by 2.4 cm = volume: 6.8 mL. Normal appearance/no adnexal mass. Left ovary Measurements: 4.0 x 1.9 x 1.4 cm = volume: 5.7 mL. Partially collapsed cyst is identified measuring 3.2 x 1.2 x 3.5 cm. Other findings:  No abnormal free fluid. IMPRESSION: 1. Partially collapsed left ovarian cyst measuring 3.2 x 1.2 x 3.5 cm. This may reflect a resolving hemorrhagic cyst or corpus luteum. No follow-up imaging recommended. 2. Normal appearance of the uterus and right ovary. Electronically Signed   By: Signa Kell M.D.   On: 10/01/2023 06:04  Procedures Procedures    Medications Ordered in ED Medications - No data to display  ED Course/ Medical Decision Making/ A&P                                 Medical Decision Making Amount and/or Complexity of Data Reviewed Labs: ordered. Radiology: ordered.   This patient presents to the ED for concern of lower abdominal pain, this involves an extensive number of treatment options, and is a complaint that carries with it a high risk of complications and morbidity.  I considered the following differential and admission for this acute, potentially life threatening condition.  The differential diagnosis includes urinary tract infection, ovarian pathology, STD  MDM:    This is a 27 year old female who presents with lower abdominal discomfort.  She is overall nontoxic.  Vital signs are notable for blood pressure systolic 80s to 90s.  Patient reports that this is consistent with her baseline.  She has some nonlateralizing lower pelvic discomfort.  Abdominal exam is fairly benign.  Discussed utility of pelvic exam for completeness of physical exam.  Patient would like to defer this as she does not feel that this is  necessary and she has low suspicion for STDs.  Urinalysis not consistent with UTI.  Basic lab workup notable for hemoglobin that is 12.4.  INR is 1.4 and in line with priors.  No obvious active bleeding.  Ultrasound shows likely an involuting ovarian cyst on the left.  I discussed this with the patient.  No follow-up recommended and patient is overall improved.  (Labs, imaging, consults)  Labs: I Ordered, and personally interpreted labs.  The pertinent results include: CBC, CMP, lipase, PT/INR, urinalysis or urine pregnancy  Imaging Studies ordered: I ordered imaging studies including ultrasound I independently visualized and interpreted imaging. I agree with the radiologist interpretation  Additional history obtained from chart review.  External records from outside source obtained and reviewed including prior evaluations and cardiology notes  Cardiac Monitoring: The patient was maintained on a cardiac monitor.  If on the cardiac monitor, I personally viewed and interpreted the cardiac monitored which showed an underlying rhythm of: Sinus  Reevaluation: After the interventions noted above, I reevaluated the patient and found that they have :improved  Social Determinants of Health:  lives independently  Disposition: Discharge  Co morbidities that complicate the patient evaluation  Past Medical History:  Diagnosis Date   Anxiety    Bicuspid aortic valve    CHF (congestive heart failure) (HCC)    Congenital aortic insufficiency    H/O aortic valve replacement    Heart murmur    Migraines    Transposition of great vessels      Medicines No orders of the defined types were placed in this encounter.   I have reviewed the patients home medicines and have made adjustments as needed  Problem List / ED Course: Problem List Items Addressed This Visit   None Visit Diagnoses       Cyst of left ovary    -  Primary                   Final Clinical Impression(s) / ED  Diagnoses Final diagnoses:  Cyst of left ovary    Rx / DC Orders ED Discharge Orders     None         Shon Baton, MD 10/01/23 [phone]

## 2023-10-01 NOTE — ED Notes (Signed)
Informed provider regarding patient bp

## 2023-10-01 NOTE — Discharge Instructions (Signed)
You were seen today for some abdominal discomfort.  Your ultrasound does show evidence of an ovarian cyst that is involuting.  Your hemoglobin is stable.  Your INR is 1.4.  Take Tylenol as needed for pain.  Follow-up closely with your primary doctor.

## 2023-10-13 ENCOUNTER — Other Ambulatory Visit: Payer: Self-pay | Admitting: Cardiovascular Disease

## 2023-10-13 DIAGNOSIS — Z5181 Encounter for therapeutic drug level monitoring: Secondary | ICD-10-CM

## 2023-10-13 NOTE — Telephone Encounter (Addendum)
 Warfarin 7.5mg  refill Aortic valve replaced Last INR 08/20/23 & missed last appt due ER visit on 10/01/23 Last OV 07/24/23  Will need to call pt to schedule an appt  Called pt and had to leave a message to call back regarding warfarin refill and to schedule an appt

## 2023-10-15 NOTE — Telephone Encounter (Signed)
 Called pt to schedule overdue coumadin clinic appt, no answer. Left message on voicemail.

## 2023-10-15 NOTE — Telephone Encounter (Signed)
 Pt returned call and scheduled coumadin clinic appt for 10/22/23. Pt state she is almost out of medication. Refill sent.

## 2023-10-22 ENCOUNTER — Ambulatory Visit: Payer: BC Managed Care – PPO | Attending: Cardiology | Admitting: *Deleted

## 2023-10-22 DIAGNOSIS — Z7901 Long term (current) use of anticoagulants: Secondary | ICD-10-CM | POA: Diagnosis not present

## 2023-10-22 DIAGNOSIS — Z952 Presence of prosthetic heart valve: Secondary | ICD-10-CM

## 2023-10-22 DIAGNOSIS — Z5181 Encounter for therapeutic drug level monitoring: Secondary | ICD-10-CM | POA: Diagnosis not present

## 2023-10-22 LAB — POCT INR: INR: 1.9 — AB (ref 2.0–3.0)

## 2023-10-22 NOTE — Patient Instructions (Signed)
Description   Continue taking Warfarin 7.5mg  daily except 3.75mg  on Sundays.  Recheck INR in 6 weeks. CALL THE OFFICE TO SCHEDULE AN APPT WITH CARDIOLOGIST. Coumadin Clinic [phone]

## 2023-11-12 ENCOUNTER — Other Ambulatory Visit: Payer: Self-pay | Admitting: Cardiovascular Disease

## 2023-11-12 DIAGNOSIS — Z5181 Encounter for therapeutic drug level monitoring: Secondary | ICD-10-CM

## 2023-11-16 ENCOUNTER — Telehealth: Payer: Self-pay | Admitting: Cardiovascular Disease

## 2023-11-16 ENCOUNTER — Emergency Department (HOSPITAL_BASED_OUTPATIENT_CLINIC_OR_DEPARTMENT_OTHER)
Admission: EM | Admit: 2023-11-16 | Discharge: 2023-11-16 | Disposition: A | Discharge status: Home / Self Care | Payer: BC Managed Care – PPO

## 2023-11-16 ENCOUNTER — Emergency Department (HOSPITAL_BASED_OUTPATIENT_CLINIC_OR_DEPARTMENT_OTHER): Payer: BC Managed Care – PPO | Admitting: Radiology

## 2023-11-16 ENCOUNTER — Other Ambulatory Visit (HOSPITAL_BASED_OUTPATIENT_CLINIC_OR_DEPARTMENT_OTHER): Payer: Self-pay

## 2023-11-16 ENCOUNTER — Other Ambulatory Visit: Payer: Self-pay

## 2023-11-16 ENCOUNTER — Encounter (HOSPITAL_BASED_OUTPATIENT_CLINIC_OR_DEPARTMENT_OTHER): Payer: Self-pay | Admitting: Emergency Medicine

## 2023-11-16 ENCOUNTER — Emergency Department (HOSPITAL_BASED_OUTPATIENT_CLINIC_OR_DEPARTMENT_OTHER): Payer: BC Managed Care – PPO

## 2023-11-16 DIAGNOSIS — Z7982 Long term (current) use of aspirin: Secondary | ICD-10-CM | POA: Insufficient documentation

## 2023-11-16 DIAGNOSIS — Z95828 Presence of other vascular implants and grafts: Secondary | ICD-10-CM | POA: Diagnosis not present

## 2023-11-16 DIAGNOSIS — I509 Heart failure, unspecified: Secondary | ICD-10-CM | POA: Insufficient documentation

## 2023-11-16 DIAGNOSIS — I499 Cardiac arrhythmia, unspecified: Secondary | ICD-10-CM | POA: Diagnosis not present

## 2023-11-16 DIAGNOSIS — Z79899 Other long term (current) drug therapy: Secondary | ICD-10-CM | POA: Diagnosis not present

## 2023-11-16 DIAGNOSIS — Q278 Other specified congenital malformations of peripheral vascular system: Secondary | ICD-10-CM | POA: Diagnosis not present

## 2023-11-16 DIAGNOSIS — R002 Palpitations: Secondary | ICD-10-CM | POA: Diagnosis not present

## 2023-11-16 DIAGNOSIS — I517 Cardiomegaly: Secondary | ICD-10-CM | POA: Diagnosis not present

## 2023-11-16 DIAGNOSIS — Z7901 Long term (current) use of anticoagulants: Secondary | ICD-10-CM | POA: Diagnosis not present

## 2023-11-16 LAB — BASIC METABOLIC PANEL
Anion gap: 9 (ref 5–15)
BUN: 10 mg/dL (ref 6–20)
CO2: 27 mmol/L (ref 22–32)
Calcium: 8.7 mg/dL — ABNORMAL LOW (ref 8.9–10.3)
Chloride: 104 mmol/L (ref 98–111)
Creatinine, Ser: 0.69 mg/dL (ref 0.44–1.00)
GFR, Estimated: >60 mL/min (ref >60)
Glucose, Bld: 81 mg/dL (ref 70–99)
Potassium: 3.9 mmol/L (ref 3.5–5.1)
Sodium: 140 mmol/L (ref 135–145)

## 2023-11-16 LAB — CBC
HCT: 39.2 % (ref 36.0–46.0)
Hemoglobin: 12.6 g/dL (ref 12.0–15.0)
MCH: 29.2 pg (ref 26.0–34.0)
MCHC: 32.1 g/dL (ref 30.0–36.0)
MCV: 91 fL (ref 80.0–100.0)
Platelets: 218 10*3/uL (ref 150–400)
RBC: 4.31 MIL/uL (ref 3.87–5.11)
RDW: 13.5 % (ref 11.5–15.5)
WBC: 4.2 10*3/uL (ref 4.0–10.5)
nRBC: 0 % (ref 0.0–0.2)

## 2023-11-16 LAB — PREGNANCY, URINE: Preg Test, Ur: NEGATIVE

## 2023-11-16 LAB — TROPONIN I (HIGH SENSITIVITY)
Troponin I (High Sensitivity): <2 ng/L (ref <18)
Troponin I (High Sensitivity): <2 ng/L (ref <18)

## 2023-11-16 LAB — PROTIME-INR
INR: 1.6 — ABNORMAL HIGH (ref 0.8–1.2)
Prothrombin Time: 19.2 s — ABNORMAL HIGH (ref 11.4–15.2)

## 2023-11-16 LAB — D-DIMER, QUANTITATIVE: D-Dimer, Quant: 1.39 ug{FEU}/mL — ABNORMAL HIGH (ref 0.00–0.50)

## 2023-11-16 MED ORDER — IOHEXOL 350 MG/ML SOLN
100.0000 mL | Freq: Once | INTRAVENOUS | Status: AC | PRN
  Administered 2023-11-16: 75 mL via INTRAVENOUS

## 2023-11-16 NOTE — Telephone Encounter (Signed)
Called patient back about message. Patient stated she has been having continuous palpitations with SOB since last night. Patient stated she is having to cough due to the SOB. Patient was offered an appointment this afternoon, but encouraged her to go to the ED if she is having SOB. Patient agreed to plan. Will send message to Dr. Eden Emms for further advisement.

## 2023-11-16 NOTE — ED Provider Notes (Signed)
[place] [facility] EMERGENCY DEPARTMENT AT [facility] Provider Note   CSN: [id number] Arrival date & time: 11/16/23  1005     History  Chief Complaint  Patient presents with   Irregular Heart Beat    Gloria Hamilton is a 28 y.o. female.  With history of CHF, anxiety, congenital aortic insufficiency, transposition of great vessels, bicuspid aortic valve, status post aortic valve replacement on Coumadin presenting to the ED for evaluation of an irregular heartbeat.  She states since last night she has felt her heart skipping beats fairly frequently.  This occurs at random and lasts 3 to 5 minutes at a time.  States in the past this is only lasted a few seconds at a time.  When she feels her heart skipping beats she feels slightly short of breath and develops a cough.  When her heart rate returned to normal her symptoms resolved.  She denies any chest pain.  No fevers or chills.  No upper respiratory infection type symptoms.  Cough is nonproductive.  Symptoms appear to be worse with rest.  HPI     Home Medications Prior to Admission medications   Medication Sig Start Date End Date Taking? Authorizing Provider  acetaminophen (TYLENOL) 325 MG tablet Take 650 mg by mouth every 6 (six) hours as needed for mild pain.    [provider]  amoxicillin (AMOXIL) 500 MG capsule Take 4 capsules by mouth 30-60 min prior to dental procedure. 08/17/23   Wendall Stade, MD  aspirin 81 MG chewable tablet Chew 81 mg by mouth daily.    [provider]  docusate sodium (COLACE) 100 MG capsule Take 100 mg by mouth 2 (two) times daily as needed for mild constipation. Patient not taking: Reported on 09/29/2022    [provider]  enoxaparin (LOVENOX) 60 MG/0.6ML injection Inject 60 mg into the skin every 12 (twelve) hours. Patient not taking: Reported on 09/29/2022 05/29/22   [provider]  hydrOXYzine (ATARAX) 25 MG tablet Take 25 mg by mouth 3 (three) times daily as needed.     [provider]  metoprolol succinate (TOPROL XL) 25 MG 24 hr tablet Take 0.5 tablets (12.5 mg total) by mouth every evening. Patient taking differently: Take 12.5 mg by mouth daily. 03/07/22   Meriam Sprague, MD  nitrofurantoin, macrocrystal-monohydrate, (MACROBID) 100 MG capsule Take 1 capsule (100 mg total) by mouth 2 (two) times daily. Patient not taking: Reported on 09/29/2022 12/12/21   Raspet, Noberto Retort, PA-C  norethindrone (CAMILA) 0.35 MG tablet Take 1 tablet (0.35 mg total) by mouth daily. 07/16/18   Rasch, Victorino Dike I, NP  polyethylene glycol (MIRALAX / GLYCOLAX) 17 g packet Take 17 g by mouth daily as needed for mild constipation. Patient not taking: Reported on 09/29/2022    [provider]  SODIUM FLUORIDE 5000 PLUS 1.1 % CREA dental cream Place 1 Application onto teeth every other day. 06/04/22   [provider]  warfarin (COUMADIN) 1 MG tablet Take 2 tablets (2mg ) by mouth daily along with your warfarin 7.5mg  tablet  as instructed by the Anticoagulation Clinic. Patient not taking: Reported on 07/04/2022 10/23/21   Meriam Sprague, MD  warfarin (COUMADIN) 7.5 MG tablet TAKE 1/2 TO 1 TABLET BY MOUTH DAILY AS DIRECTED BY COUMADIN CLINIC 11/12/23   Wendall Stade, MD      Allergies    Ceftriaxone, Ciprofloxacin, and Lisinopril    Review of Systems   Review of Systems  Respiratory:  Positive for cough.  Cardiovascular:  Positive for palpitations.  All other systems reviewed and are negative.   Physical Exam Updated Vital Signs BP 93/71   Pulse 69   Temp 98 F (36.7 C) (Oral)   Resp 17   LMP 11/14/2023   SpO2 100%  Physical Exam Vitals and nursing note reviewed.  Constitutional:      General: She is not in acute distress.    Appearance: Normal appearance. She is normal weight. She is not ill-appearing.  HENT:     Head: Normocephalic and atraumatic.  Cardiovascular:     Rate and Rhythm: Normal rate and regular rhythm.     Comments:  Loud systolic murmur Pulmonary:     Effort: Pulmonary effort is normal. No respiratory distress.     Breath sounds: No wheezing, rhonchi or rales.  Chest:     Comments: Sternal surgical scar well-healing Abdominal:     General: Abdomen is flat.  Musculoskeletal:        General: Normal range of motion.     Cervical back: Neck supple.  Skin:    General: Skin is warm and dry.  Neurological:     Mental Status: She is alert and oriented to person, place, and time.  Psychiatric:        Mood and Affect: Mood normal.        Behavior: Behavior normal.     ED Results / Procedures / Treatments   Labs (all labs ordered are listed, but only abnormal results are displayed) Labs Reviewed  BASIC METABOLIC PANEL - Abnormal; Notable for the following components:      Result Value   Calcium 8.7 (*)    All other components within normal limits  PROTIME-INR - Abnormal; Notable for the following components:   Prothrombin Time 19.2 (*)    INR 1.6 (*)    All other components within normal limits  D-DIMER, QUANTITATIVE - Abnormal; Notable for the following components:   D-Dimer, Quant 1.39 (*)    All other components within normal limits  CBC  PREGNANCY, URINE  TROPONIN I (HIGH SENSITIVITY)  TROPONIN I (HIGH SENSITIVITY)    EKG EKG Interpretation Date/Time:  Monday November 16 2023 10:21:29 EST Ventricular Rate:  71 PR Interval:  120 QRS Duration:  80 QT Interval:  400 QTC Calculation: 434 R Axis:   44  Text Interpretation: Normal sinus rhythm Possible Left atrial enlargement Nonspecific ST and T wave abnormality Abnormal ECG When compared with ECG of 01-Oct-2023 00:47, Non-specific change in ST segment in Lateral leads T wave inversion now evident in Anterior leads T wave amplitude has increased in Lateral leads Confirmed by Estanislado Pandy [id number]) on 11/16/2023 2:20:11 PM  Radiology CT Angio Chest PE W/Cm &/Or Wo Cm Result Date: 11/16/2023 CLINICAL DATA:  Pulmonary embolism (PE)  suspected, low to intermediate prob, positive D-dimer. Irregular heartbeat felt by patient. EXAM: CT ANGIOGRAPHY CHEST WITH CONTRAST TECHNIQUE: Multidetector CT imaging of the chest was performed using the standard protocol during bolus administration of intravenous contrast. Multiplanar CT image reconstructions and MIPs were obtained to evaluate the vascular anatomy. RADIATION DOSE REDUCTION: This exam was performed according to the departmental dose-optimization program which includes automated exposure control, adjustment of the mA and/or kV according to patient size and/or use of iterative reconstruction technique. CONTRAST:  75mL OMNIPAQUE IOHEXOL 350 MG/ML SOLN COMPARISON:  CT angiography chest from 07/04/2021. FINDINGS: Cardiovascular: No evidence of embolism to the proximal subsegmental pulmonary artery level. Mild cardiomegaly. No pericardial effusion. No aortic aneurysm. Prosthetic aortic  and pulmonary valves noted. 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 Mediastinum/Nodes: Visualized thyroid gland appears grossly unremarkable. No solid / cystic mediastinal masses. The esophagus is nondistended precluding optimal assessment. No axillary, mediastinal or hilar lymphadenopathy by size criteria. Lungs/Pleura: The central tracheo-bronchial tree is patent. No mass or consolidation. No pleural effusion or pneumothorax. No suspicious lung nodules. Upper Abdomen: Visualized upper abdominal viscera within normal limits. Musculoskeletal: The visualized soft tissues of the chest wall are grossly unremarkable. No suspicious osseous lesions. Review of the MIP images confirms the above findings. IMPRESSION: 1. No embolism to the proximal subsegmental pulmonary artery level. 2. No lung mass, consolidation, pleural effusion or pneumothorax. 3. Other nonacute observations, as described above. Electronically Signed   By: Jules Schick M.D.   On: 11/16/2023 17:08   DG Chest 2 View Result Date: 11/16/2023 CLINICAL DATA:  Cardiac arrhythmia.  Palpitations. EXAM: CHEST - 2 VIEW COMPARISON:  03/08/2021 FINDINGS: The heart size and mediastinal contours are within normal limits. Prior aortic valve replacement and stent graft in the aortic root again seen. Surgical clips also seen in the right upper hemithorax. Both lungs are clear. The visualized skeletal structures are unremarkable. IMPRESSION: No active cardiopulmonary disease. Electronically Signed   By: Danae Orleans M.D.   On: 11/16/2023 11:14    Procedures Procedures    Medications Ordered in ED Medications  iohexol (OMNIPAQUE) 350 MG/ML injection 100 mL (75 mLs Intravenous Contrast Given 11/16/23 1512)    ED Course/ Medical Decision Making/ A&P                                 Medical Decision Making Amount and/or Complexity of Data Reviewed Labs: ordered. Radiology: ordered.  Risk Prescription drug management.  This patient presents to the ED for concern of palpitations, this involves an extensive number of treatment options, and is a complaint that carries with it a high risk of complications and morbidity. The differential diagnosis for palpitations includes cardiac arrhythmias, PVC/PAC, ACS, Cardiomyopathy, CHF, MVP, pericarditis, valvular disease, Panic/Anxiety, Somatic disorder, ETOH, Caffeine,  Stimulant use, medication side effect, Anemia, Hyperthyroidism, pulmonary embolism.   My initial workup includes labs, imaging, EKG  Additional history obtained from: Nursing notes from this visit.  I ordered, reviewed and interpreted labs which include: CBC, BMP, troponin, delta troponin, INR, D-dimer.  No leukocytosis or anemia.  No electrolyte derangement or kidney dysfunction.  Initial delta troponin negative.  INR at 1.6.  D-dimer elevated to 1.39.  I ordered imaging studies including chest x-ray, CT PE study I independently visualized and interpreted  imaging which showed no acute cardiopulmonary abnormalities.  CT PE study negative I agree with the radiologist interpretation  Cardiac Monitoring:  The patient was maintained on a cardiac monitor.  I personally viewed and interpreted the cardiac monitored which showed an underlying rhythm of: NSR  Afebrile, hemodynamically stable.  28 year old female presenting to the ED for evaluation of palpitations.  States she feels like her heart is skipping beats.  This been present since last night.  This is not a new complaint for her and has happened in the past, however it is happening for longer durations at this time.  She has been in the department for 7 hours, however has only been in the room for approximately 4 hours.  During that stay she has not had any symptoms of  palpitations.  She denies any chest pain.  Lab workup reassuring.  Chest x-ray and CT PE study negative.  She is established with cardiology.  She was encouraged to follow-up regarding her symptoms.  She was given return precautions.  Stable at discharge.  At this time there does not appear to be any evidence of an acute emergency medical condition and the patient appears stable for discharge with appropriate outpatient follow up. Diagnosis was discussed with patient who verbalizes understanding of care plan and is agreeable to discharge. I have discussed return precautions with patient who verbalizes understanding. Patient encouraged to follow-up with their PCP within 1 week. All questions answered.  Note: Portions of this report may have been transcribed using voice recognition software. Every effort was made to ensure accuracy; however, inadvertent computerized transcription errors may still be present.         Final Clinical Impression(s) / ED Diagnoses Final diagnoses:  Palpitations    Rx / DC Orders ED Discharge Orders     None         Michelle Piper, PA-C 11/16/23 1726    Coral Spikes, DO 11/17/23  [phone]

## 2023-11-16 NOTE — Telephone Encounter (Signed)
Patient c/o Palpitations:  STAT if patient reporting lightheadedness, shortness of breath, or chest pain  How long have you had palpitations/irregular HR/ Afib? Are you having the symptoms now? Started having palpitations last night- not any at this time, but had some also earlier this morning  Are you currently experiencing lightheadedness, SOB or CP? Some shortness of breath  Do you have a history of afib (atrial fibrillation) or irregular heart rhythm?   Have you checked your BP or HR? (document readings if available):   Are you experiencing any other symptoms? Coughing- patient wants to be seen today, I offered  her an appointment for tomorrow

## 2023-11-16 NOTE — ED Triage Notes (Signed)
Pt reports "irregular heartbeat" starting last night. Reports feels like its skipping a beat and reports feels shob  like theres a bubble" when it happens. Denies recent illness or CP

## 2023-11-16 NOTE — Telephone Encounter (Signed)
Wendall Stade, MD  You6 minutes ago (10:25 AM)   Continue metroprolol agree with primary/ER/urgent care visit r/o flu, RSV, Covid. Can order 30 day monitor   Left message for patient to call back.

## 2023-11-16 NOTE — Discharge Instructions (Signed)
You have been seen today for your complaint of palpitations. Your lab work was reassuring. Your imaging was reassuring. Follow up with: Your cardiologist Please seek immediate medical care if you develop any of the following symptoms: You have chest pain or shortness of breath. You have a severe headache. You feel dizzy or you faint. At this time there does not appear to be the presence of an emergent medical condition, however there is always the potential for conditions to change. Please read and follow the below instructions.  Do not take your medicine if  develop an itchy rash, swelling in your mouth or lips, or difficulty breathing; call 911 and seek immediate emergency medical attention if this occurs.  You may review your lab tests and imaging results in their entirety on your MyChart account.  Please discuss all results of fully with your primary care provider and other specialist at your follow-up visit.  Note: Portions of this text may have been transcribed using voice recognition software. Every effort was made to ensure accuracy; however, inadvertent computerized transcription errors may still be present.

## 2023-11-23 ENCOUNTER — Encounter: Payer: Self-pay | Admitting: *Deleted

## 2023-11-23 ENCOUNTER — Telehealth: Payer: Self-pay | Admitting: Cardiovascular Disease

## 2023-11-23 DIAGNOSIS — Z952 Presence of prosthetic heart valve: Secondary | ICD-10-CM

## 2023-11-23 DIAGNOSIS — R002 Palpitations: Secondary | ICD-10-CM

## 2023-11-23 NOTE — Telephone Encounter (Signed)
 Patient c/o Palpitations:  High priority if patient c/o lightheadedness, shortness of breath, or chest pain  How long have you had palpitations/irregular HR/ Afib? Are you having the symptoms now? 11/15/23  Are you currently experiencing lightheadedness, SOB or CP? No  Do you have a history of afib (atrial fibrillation) or irregular heart rhythm? No Have you checked your BP or HR? (document readings if available): No  Are you experiencing any other symptoms? SOB, coughing, chest discomfort/pressure.    Patient went to the ER on 11/16/23 due to having irregular heart rate. Patient is requesting to wear a heart monitor. Please advise.

## 2023-11-23 NOTE — Telephone Encounter (Signed)
 Called patient back about message. Patient stated she has been having the same symptoms as before. Patient never returned call to our office, so informed her that Dr. Stann Earnest wanted her to wear a 30 day monitor. Patient did state that her symptoms are worse at night. Will order monitor. Patient requested the sensitive stickers be used for her extremely sensitive skin.    November 16, 2023 Me     11/16/23 10:33 AM Note  Gloria Rule, MD  You6 minutes ago (10:25 AM)    Continue metroprolol agree with primary/ER/urgent care visit r/o flu, RSV, Covid. Can order 30 day monitor    Left message for patient to call back.

## 2023-11-23 NOTE — Progress Notes (Signed)
 Patient enrolled for [employer]/ [employer] to ship a 30 day cardiac event monitor to her address on file. Sensitive skin electrodes requested.

## 2023-11-26 DIAGNOSIS — R002 Palpitations: Secondary | ICD-10-CM

## 2023-12-03 ENCOUNTER — Ambulatory Visit: Payer: BC Managed Care – PPO

## 2023-12-03 DIAGNOSIS — R300 Dysuria: Secondary | ICD-10-CM | POA: Diagnosis not present

## 2023-12-03 DIAGNOSIS — N898 Other specified noninflammatory disorders of vagina: Secondary | ICD-10-CM | POA: Diagnosis not present

## 2023-12-09 ENCOUNTER — Ambulatory Visit: Payer: BC Managed Care – PPO | Attending: Cardiovascular Disease

## 2023-12-09 DIAGNOSIS — Z952 Presence of prosthetic heart valve: Secondary | ICD-10-CM | POA: Diagnosis not present

## 2023-12-09 DIAGNOSIS — Z7901 Long term (current) use of anticoagulants: Secondary | ICD-10-CM

## 2023-12-09 LAB — POCT INR: INR: 1.8 — AB (ref 2.0–3.0)

## 2023-12-09 NOTE — Patient Instructions (Signed)
Description   Continue taking Warfarin 7.5mg  daily except 3.75mg  on Sundays.  Recheck INR in 6 weeks. CALL THE OFFICE TO SCHEDULE AN APPT WITH CARDIOLOGIST. Coumadin Clinic [phone]

## 2023-12-21 ENCOUNTER — Other Ambulatory Visit: Payer: Self-pay | Admitting: Cardiovascular Disease

## 2023-12-21 DIAGNOSIS — Z5181 Encounter for therapeutic drug level monitoring: Secondary | ICD-10-CM

## 2023-12-21 MED ORDER — WARFARIN SODIUM 7.5 MG PO TABS: ORAL_TABLET | ORAL | 0 refills | Status: DC

## 2023-12-29 ENCOUNTER — Ambulatory Visit: Attending: Cardiovascular Disease

## 2023-12-29 DIAGNOSIS — Z952 Presence of prosthetic heart valve: Secondary | ICD-10-CM

## 2023-12-29 DIAGNOSIS — R002 Palpitations: Secondary | ICD-10-CM

## 2024-01-11 ENCOUNTER — Other Ambulatory Visit: Payer: Self-pay | Admitting: Cardiovascular Disease

## 2024-01-11 DIAGNOSIS — Z5181 Encounter for therapeutic drug level monitoring: Secondary | ICD-10-CM

## 2024-01-11 DIAGNOSIS — Z952 Presence of prosthetic heart valve: Secondary | ICD-10-CM

## 2024-01-11 NOTE — Telephone Encounter (Signed)
 Warfarin 7.5mg  refill Dx-Aortic valve replaced  Last INR 12/09/23 Last OV 07/24/23

## 2024-01-20 ENCOUNTER — Ambulatory Visit: Payer: BC Managed Care – PPO | Attending: Cardiovascular Disease | Admitting: *Deleted

## 2024-01-20 DIAGNOSIS — Z952 Presence of prosthetic heart valve: Secondary | ICD-10-CM

## 2024-01-20 DIAGNOSIS — Z5181 Encounter for therapeutic drug level monitoring: Secondary | ICD-10-CM

## 2024-01-20 DIAGNOSIS — Z7901 Long term (current) use of anticoagulants: Secondary | ICD-10-CM | POA: Diagnosis not present

## 2024-01-20 LAB — POCT INR: INR: 1.7 — AB (ref 2.0–3.0)

## 2024-01-20 NOTE — Patient Instructions (Addendum)
Description   Continue taking Warfarin 7.5mg  daily except 3.75mg  on Sundays.  Recheck INR in 6 weeks. CALL THE OFFICE TO SCHEDULE AN APPT WITH CARDIOLOGIST. Coumadin Clinic [phone]

## 2024-03-03 ENCOUNTER — Ambulatory Visit: Attending: Cardiology | Admitting: *Deleted

## 2024-03-03 DIAGNOSIS — Z5181 Encounter for therapeutic drug level monitoring: Secondary | ICD-10-CM | POA: Diagnosis not present

## 2024-03-03 DIAGNOSIS — Z952 Presence of prosthetic heart valve: Secondary | ICD-10-CM | POA: Diagnosis not present

## 2024-03-03 DIAGNOSIS — Z7901 Long term (current) use of anticoagulants: Secondary | ICD-10-CM | POA: Diagnosis not present

## 2024-03-03 LAB — POCT INR: POC INR: 1.7

## 2024-03-03 NOTE — Patient Instructions (Signed)
Description   Continue taking Warfarin 7.5mg  daily except 3.75mg  on Sundays.  Recheck INR in 6 weeks. CALL THE OFFICE TO SCHEDULE AN APPT WITH CARDIOLOGIST. Coumadin Clinic [phone]

## 2024-04-14 ENCOUNTER — Ambulatory Visit: Attending: Cardiology | Admitting: *Deleted

## 2024-04-14 DIAGNOSIS — Z5181 Encounter for therapeutic drug level monitoring: Secondary | ICD-10-CM

## 2024-04-14 DIAGNOSIS — Z952 Presence of prosthetic heart valve: Secondary | ICD-10-CM | POA: Diagnosis not present

## 2024-04-14 DIAGNOSIS — Z7901 Long term (current) use of anticoagulants: Secondary | ICD-10-CM | POA: Diagnosis not present

## 2024-04-14 LAB — POCT INR: POC INR: 1.6

## 2024-04-14 MED ORDER — WARFARIN SODIUM 7.5 MG PO TABS: ORAL_TABLET | ORAL | 2 refills | Status: DC

## 2024-04-14 NOTE — Progress Notes (Signed)
Please see anticoagulation encounter.

## 2024-04-14 NOTE — Patient Instructions (Addendum)
Description   Continue taking Warfarin 7.5mg daily except 3.75mg on Sundays.  Recheck INR in 6 weeks. Coumadin Clinic [phone].       

## 2024-04-14 NOTE — Addendum Note (Signed)
 Addended by: ROYDEN ISAIAH NOVAK on: 04/14/2024 08:53 AM   Modules accepted: Orders

## 2024-05-10 DIAGNOSIS — Z7901 Long term (current) use of anticoagulants: Secondary | ICD-10-CM | POA: Diagnosis not present

## 2024-05-10 DIAGNOSIS — F419 Anxiety disorder, unspecified: Secondary | ICD-10-CM | POA: Diagnosis not present

## 2024-05-10 DIAGNOSIS — Z952 Presence of prosthetic heart valve: Secondary | ICD-10-CM | POA: Diagnosis not present

## 2024-05-26 ENCOUNTER — Ambulatory Visit: Attending: Cardiology

## 2024-05-26 DIAGNOSIS — Z5181 Encounter for therapeutic drug level monitoring: Secondary | ICD-10-CM | POA: Diagnosis not present

## 2024-05-26 DIAGNOSIS — Z952 Presence of prosthetic heart valve: Secondary | ICD-10-CM

## 2024-05-26 LAB — POCT INR: INR: 1.9 — AB (ref 2.0–3.0)

## 2024-05-26 NOTE — Patient Instructions (Signed)
Description   Continue taking Warfarin 7.5mg daily except 3.75mg on Sundays.  Recheck INR in 6 weeks. Coumadin Clinic [phone].       

## 2024-05-26 NOTE — Progress Notes (Signed)
 INR 1.9; Please see anticoagulation encounter

## 2024-06-08 DIAGNOSIS — F419 Anxiety disorder, unspecified: Secondary | ICD-10-CM | POA: Diagnosis not present

## 2024-06-09 DIAGNOSIS — N390 Urinary tract infection, site not specified: Secondary | ICD-10-CM | POA: Diagnosis not present

## 2024-06-09 DIAGNOSIS — Z87440 Personal history of urinary (tract) infections: Secondary | ICD-10-CM | POA: Diagnosis not present

## 2024-07-07 ENCOUNTER — Ambulatory Visit: Attending: Cardiovascular Disease

## 2024-07-07 DIAGNOSIS — Z5181 Encounter for therapeutic drug level monitoring: Secondary | ICD-10-CM

## 2024-07-07 DIAGNOSIS — Z952 Presence of prosthetic heart valve: Secondary | ICD-10-CM | POA: Diagnosis not present

## 2024-07-07 LAB — POCT INR: INR: 2.5 (ref 2.0–3.0)

## 2024-07-07 NOTE — Patient Instructions (Signed)
 Take 0.5 tablet today only then Continue taking Warfarin 7.5mg  daily except 3.75mg  on Sundays.  Recheck INR in 2 weeks.  Coumadin  Clinic [phone]

## 2024-07-07 NOTE — Progress Notes (Signed)
 INR 2.5 Please see anticoagulation encounter Take 0.5 tablet today only then Continue taking Warfarin 7.5mg  daily except 3.75mg  on Sundays.  Recheck INR in 2 weeks.  Coumadin  Clinic [phone]

## 2024-07-21 ENCOUNTER — Ambulatory Visit: Attending: Cardiovascular Disease | Admitting: *Deleted

## 2024-07-21 DIAGNOSIS — Z5181 Encounter for therapeutic drug level monitoring: Secondary | ICD-10-CM | POA: Diagnosis not present

## 2024-07-21 DIAGNOSIS — Z952 Presence of prosthetic heart valve: Secondary | ICD-10-CM | POA: Diagnosis not present

## 2024-07-21 DIAGNOSIS — I351 Nonrheumatic aortic (valve) insufficiency: Secondary | ICD-10-CM | POA: Diagnosis not present

## 2024-07-21 DIAGNOSIS — Z954 Presence of other heart-valve replacement: Secondary | ICD-10-CM | POA: Diagnosis not present

## 2024-07-21 DIAGNOSIS — Q2381 Bicuspid aortic valve: Secondary | ICD-10-CM | POA: Diagnosis not present

## 2024-07-21 LAB — POCT INR: POC INR: 2.4

## 2024-07-21 NOTE — Progress Notes (Signed)
 Lab Results  Component Value Date   INR 2.4 07/21/2024   INR 2.5 07/07/2024   INR 1.9 (A) 05/26/2024    Description   INR 2.4, Continue taking Warfarin 7.5mg  daily except 3.75mg  on Sundays.  Recheck INR in 4 weeks.  Coumadin  Clinic [phone]

## 2024-07-21 NOTE — Patient Instructions (Addendum)
 Description   INR 2.4, Continue taking Warfarin 7.5mg  daily except 3.75mg  on Sundays.  Recheck INR in 4 weeks.  Coumadin  Clinic [phone]

## 2024-08-18 ENCOUNTER — Telehealth: Payer: Self-pay | Admitting: *Deleted

## 2024-08-18 ENCOUNTER — Ambulatory Visit: Attending: Cardiovascular Disease | Admitting: *Deleted

## 2024-08-18 DIAGNOSIS — Z952 Presence of prosthetic heart valve: Secondary | ICD-10-CM | POA: Diagnosis not present

## 2024-08-18 DIAGNOSIS — Z5181 Encounter for therapeutic drug level monitoring: Secondary | ICD-10-CM | POA: Diagnosis not present

## 2024-08-18 LAB — POCT INR: POC INR: 2.2

## 2024-08-18 NOTE — Patient Instructions (Signed)
 Description   INR 2.2, Continue taking Warfarin 7.5mg  daily except 3.75mg  on Sundays.  Recheck INR in  5 weeks.  Coumadin  Clinic [phone]

## 2024-08-18 NOTE — Progress Notes (Signed)
 Cardiology Office Note:    Date:  08/18/2024   ID:  Gloria Hamilton, DOB [birth year], MRN [id number]  PCP:  Rolinda Millman, MD  [facility] Cardiologist:  Maude Emmer, MD  [facility] Electrophysiologist:  None   Referring MD: Rolinda Millman, MD    History of Present Illness:    Gloria Hamilton is a 28 y.o. female with complex congenital heart dx.   Born with a bicuspid aortic valve and underwent a Ross procedure with replacement of ascending aorta with 24mm Dacron aortic graft and placement of 23mm pulmonary homograft in 6963 at 28 years of age. She then underwent reoperation on 05/17/2020 by Dr. Chad Hughes with Karyle procedure with #27/29 mm On-X mechanical valved conduit and 26mm Valsalva graft. Her RV-PA conduit was not replaced at that time. 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 Tylenol  with ? Adding SNRI per rheum F/U Dr Jeannetta  ***  She has a boyfriend 8 years. Has implant for contraception. Works at the [email or web address]   Past Medical History:  Diagnosis Date   Anxiety    Bicuspid aortic valve    CHF (congestive heart failure) (HCC)    Congenital aortic insufficiency    H/O aortic valve replacement    Heart murmur    Migraines    Transposition of great vessels     Past Surgical History:  Procedure Laterality Date   BENTALL PROCEDURE  05/17/2020   S/P Aortic Aneurysm repair , Redo thoracotomy at [facility] [place]   CYST EXCISION     pilonodal   Okey Procedure       Current Medications: No outpatient medications have been marked as taking for the 08/26/24 encounter (Appointment) with Emmer Maude BROCKS, MD.     Allergies:   Ceftriaxone, Ciprofloxacin, and Lisinopril   Social History   Socioeconomic History   Marital status: Significant Other    Spouse name: Not on file   Number of children: 0   Years of education: 16   Highest education level: Not on file  Occupational History   Not on file  Tobacco Use   Smoking status: Never   Smokeless tobacco: Never  Vaping Use   Vaping status: Never Used  Substance and Sexual Activity   Alcohol use: No   Drug use: No   Sexual activity: Not on file  Other Topics Concern   Not on file  Social History Narrative   Not on file   Social Drivers of Health   Financial Resource Strain: Not on file  Food Insecurity: Not on file  Transportation Needs: Not on file  Physical Activity: Not on file  Stress: Not on file  Social Connections: Not on file     Family History: The patient's family history is not on file.  ROS:   Please see the history of present illness.    Review of Systems  Constitutional:  Negative for chills and fever.  HENT:  Negative for nosebleeds.   Eyes:  Negative for blurred vision.  Respiratory:  Negative for shortness of breath.   Cardiovascular:  Positive for palpitations. Negative for chest pain, orthopnea, claudication, leg swelling and PND.  Gastrointestinal:  Negative for blood in stool, melena, nausea and vomiting.  Genitourinary:  Negative for hematuria.  Musculoskeletal:  Negative for falls.  Neurological:  Negative for dizziness and loss of consciousness.  Endo/Heme/Allergies:  Negative for polydipsia.  Psychiatric/Behavioral:  Negative for substance abuse.      EKG:  No ECG performed today  Recent Labs: 10/01/2023: ALT 8 11/16/2023: BUN 10; Creatinine, Ser 0.69; Hemoglobin 12.6; Platelets 218; Potassium 3.9; Sodium 140  Recent Lipid Panel No results found  for: CHOL, TRIG, HDL, CHOLHDL, VLDL, LDLCALC, LDLDIRECT  Echo done at [facility] Congenital 07/21/24 Congenital aortic stenosis s/p Ross procedure in 2009, with subsequent mechanical Bentall in 2021 and 23mm SAPIEN S3 transcatheter PVR in 2023.   - Well-seated transcatheter pulmonary valve with no significant stenosis (pk/mn gradients 10/5 mmHg) or insufficiency.  - Well-seated mechanical aortic valve with no significant stenosis (pk/mn gradients 5/3 mmHg). Trivial aortic insufficiency. No evidence of paravalvar leak.  - Normal biventricular size and systolic function.  - Mild TR with estimated RVSP 24 mmHg    Cardiac MRI 02/23/23:   Cardiac MRI 1. The left ventricle is normal in cavity size and wall thickness. Global systolic function is normal with an LV ejection fraction calculated at 56% (prior 58%). There are no regional wall motion abnormalities. 2. The right ventricle is normal in cavity size, wall thickness, and systolic function. 3. The right atrium is mildly enlarged. The left atrium is normal in size. 4. There is a mechanical prosthetic valve in the aortic position. The leaflets are not well visualized due to metal artifacts. However, the valve appears to be well-seated and leaflets motion appear to be normal. Peak aortic flow velocity is 1.2 m/s (prior 1.2 m/s). There is mild aortic regurgitation.  5. There is a prothetic valve in pulmonic position which is well seated. The leaflets are not well visualized due to metal artifacts. The peak velocity was 2.0 m/s, there is no regurgitation. 6. Delayed enhancement imaging demonstrates no evidence of myocardial infarction, scar or infiltrative disease.  7. No LV thrombus visualized.  Chest MRA 1. S/p aortic root and ascending aorta replacement without contrast extravasation. The arch and descending aorta are normal in diameter. There is a non-obstructive kink in the ascending aorta just above the level of the sinotubular junction.  There is no dissection seen. 2. The aortic arch is left sided. There is variant arch vessel anatomy. The arch vessels arise in the following order: right common carotid, left common carotid, left subclavian artery and then there is an aberrant right subclavian artery originating from the medial aspect of the distal aortic arch.  3. The main and proximal branch pulmonary arteries are normal in size.  4. Normal systemic venous connections.  Bi-orthogonal luminal aortic dimensions are listed below: Aortic root: 3.2 x 3.2 cm (prior 3.4 x 3.2 cm) Ascending aorta: 2.5 x 2.5 cm (prior 2.8 x 2.7 cm)  Transverse aorta: 2.6 x 1.9 cm (prior 2.6 x 2.1 cm) Descending aorta (level of the PA's): 2.4 x 2.3 cm (prior 2.2 x 2.0 cm ) Descending aorta (diaphragm): 1.9 x 1.9 cm (prior 1.9 x 1.8 cm    Physical Exam:    VS:  There were no vitals taken for this visit.    Wt Readings from Last 3 Encounters:  10/01/23 136 lb (61.7 kg)  07/24/23 133 lb  6.4 oz (60.5 kg)  09/29/22 133 lb 6.4 oz (60.5 kg)     General Appearance: Well appearing and in no acute distress  HEENT: [place]/AT, PERRL, no scleral icterus, MMM.  Chest: Breath Sounds: Normal with no rales or rhonchi Well healed sternotomy scar  CARDIOVASCULAR: Regular Rate and Rhythm Palpation: Normal with no lifts, heaves or thrills evident. PMI non-displaced. Auscultation: S1: Normal in intensity S2: Normal in intensity with physiologic splitting Clicks: Crisp mechanical valve clicks Gallops: None Murmurs: None appreciated over loud mechanical valve sounds JVP: Normal with no JVD  Peripheral Pulses: 2+ upper and lower extremity pulses  ABDOMEN: Non-distended, non-tender No hepatomegaly  PERIPHERAL: Cyanosis: No Clubbing: No Edema: None  SKIN: No lower extremity rashes or ulcers  NEUROLOGIC: Alert, interactive, and appropriate, grossly moving all 4 extremities    PLAN:    In order of problems listed above:  #Bicuspid AoV with aortic dilation  s/p mechanical AVR with root repair: -Continue warfarin with goal INR 1.5-2.5 -Continue metop 12.5mg  daily; no BP room for ARB at this time -Normal function on TTE done at [facility] 07/21/24 - note she has an aberrant right subclavian artery on MRA  #Moderate PI: History of Ross procedure. - Post Melody stent valve at [facility]  - Normal function by MRI/TEE at [facility] 02/23/23   #Sinus Tachycardia: #Palpitations: Cardiac monitor 12/07/20 with NSR with HR 64-151 with no arrhythmias.  TTE as above with normal gradients across the valve. LVEF improved to 58% by CMR. Stress test normal. Symptoms significantly improved with metoprolol  -Continue metop 12.5mg  daily  #Chronic AC: On warfarin for mechanical AoV as above. Goal INR 1.5-2.5  -Continue warfarin; follows with [facility] clinic -Knows issues with becoming pregnant on coumadin    #Rupture of Ovarian Cyst with Resultant Anemia: Occurred in 06/2021 with resultant anemia and required warfarin reversal. Was managed conservatively and INR was reversed. She did well and was resumed on warfarin without issues.   F/U in a year     Signed, Maude Emmer, MD  08/18/2024 3:39 PM    [facility]

## 2024-08-18 NOTE — Progress Notes (Signed)
 Lab Results  Component Value Date   INR 2.2 08/18/2024   INR 2.4 07/21/2024   INR 2.5 07/07/2024    Description   INR 2.2, Continue taking Warfarin 7.5mg  daily except 3.75mg  on Sundays.  Recheck INR in  5 weeks.  Coumadin  Clinic [phone]

## 2024-08-18 NOTE — Telephone Encounter (Signed)
 Pt needs refill of Toprol  XL sent to the [facility] on [address].    Pt states she takes 25mg  daily (1 tablet). Different dose the we have on file.

## 2024-08-18 NOTE — Telephone Encounter (Signed)
 Placed call to pt.  Looks like [facility] has been filling the Toprol , per pt.  She will reach out to them to get filled.

## 2024-08-22 DIAGNOSIS — F419 Anxiety disorder, unspecified: Secondary | ICD-10-CM | POA: Diagnosis not present

## 2024-08-26 ENCOUNTER — Encounter: Payer: Self-pay | Admitting: Cardiovascular Disease

## 2024-08-26 ENCOUNTER — Ambulatory Visit: Attending: Cardiovascular Disease | Admitting: Cardiovascular Disease

## 2024-08-26 VITALS — BP 91/60 | HR 75 | Ht 63.0 in | Wt 144.0 lb

## 2024-08-26 DIAGNOSIS — Z952 Presence of prosthetic heart valve: Secondary | ICD-10-CM

## 2024-08-26 DIAGNOSIS — I371 Nonrheumatic pulmonary valve insufficiency: Secondary | ICD-10-CM | POA: Diagnosis not present

## 2024-08-26 DIAGNOSIS — Z7901 Long term (current) use of anticoagulants: Secondary | ICD-10-CM

## 2024-08-26 NOTE — Patient Instructions (Signed)
 Medication Instructions:  NO CHANGES  Lab Work: NONE TO BE DONE TODAY.  Testing/Procedures: NONE  Follow-Up: At [facility], you and your health needs are our priority.  As part of our continuing mission to provide you with exceptional heart care, our providers are all part of one team.  This team includes your primary Cardiologist (physician) and Advanced Practice Providers or APPs (Physician Assistants and Nurse Practitioners) who all work together to provide you with the care you need, when you need it.  Your next appointment:   1 YEAR  Provider:   Maude Emmer, MD

## 2024-09-18 ENCOUNTER — Other Ambulatory Visit: Payer: Self-pay | Admitting: Cardiovascular Disease

## 2024-09-18 DIAGNOSIS — Z952 Presence of prosthetic heart valve: Secondary | ICD-10-CM

## 2024-09-18 DIAGNOSIS — Z5181 Encounter for therapeutic drug level monitoring: Secondary | ICD-10-CM

## 2024-09-19 NOTE — Telephone Encounter (Signed)
 Refill request for warfarin:  Last INR was 2.2 on 08/18/24 Next INR due 09/22/24 LOV was 08/26/24  Refill approved.

## 2024-09-22 ENCOUNTER — Ambulatory Visit: Attending: Cardiovascular Disease | Admitting: *Deleted

## 2024-09-22 DIAGNOSIS — Z952 Presence of prosthetic heart valve: Secondary | ICD-10-CM | POA: Diagnosis not present

## 2024-09-22 DIAGNOSIS — Z5181 Encounter for therapeutic drug level monitoring: Secondary | ICD-10-CM

## 2024-09-22 LAB — POCT INR: POC INR: 2.9

## 2024-09-22 NOTE — Progress Notes (Signed)
 Lab Results  Component Value Date   INR 2.9 09/22/2024   INR 2.2 08/18/2024   INR 2.4 07/21/2024    Description   INR 2.9, Hold warfarin today then Continue taking Warfarin 7.5mg  daily except 3.75mg  on Sundays.  Recheck INR in 4 weeks.  Coumadin  Clinic [phone]

## 2024-09-22 NOTE — Patient Instructions (Signed)
 Description   INR 2.9, Hold warfarin today then Continue taking Warfarin 7.5mg  daily except 3.75mg  on Sundays.  Recheck INR in 4 weeks.  Coumadin  Clinic [phone]

## 2024-10-20 ENCOUNTER — Ambulatory Visit: Payer: Self-pay | Attending: Cardiovascular Disease | Admitting: Pharmacist

## 2024-10-20 DIAGNOSIS — Z5181 Encounter for therapeutic drug level monitoring: Secondary | ICD-10-CM

## 2024-10-20 DIAGNOSIS — Z952 Presence of prosthetic heart valve: Secondary | ICD-10-CM | POA: Diagnosis not present

## 2024-10-20 LAB — POCT INR: INR: 2.8 (ref 2.0–3.0)

## 2024-10-20 NOTE — Progress Notes (Signed)
 Description   INR 2.8, Hold warfarin today then Continue taking Warfarin 7.5mg  daily except 3.75mg  on Sundays.  Recheck INR in 3 weeks.  Coumadin  Clinic [phone]

## 2024-10-20 NOTE — Patient Instructions (Signed)
 Description   INR 2.8, Hold warfarin today then Continue taking Warfarin 7.5mg  daily except 3.75mg  on Sundays.  Recheck INR in 3 weeks.  Coumadin  Clinic [phone]

## 2024-11-10 ENCOUNTER — Ambulatory Visit: Admitting: *Deleted

## 2024-11-10 DIAGNOSIS — Z952 Presence of prosthetic heart valve: Secondary | ICD-10-CM

## 2024-11-10 DIAGNOSIS — Z5181 Encounter for therapeutic drug level monitoring: Secondary | ICD-10-CM

## 2024-11-10 LAB — POCT INR: POC INR: 2.7

## 2024-11-10 NOTE — Patient Instructions (Signed)
 Description   INR 2.7, START taking Warfarin 7.5mg  daily except 3.75mg  on Sundays and Thursdays.  Recheck INR in 3 weeks.  Coumadin  Clinic [phone]

## 2024-11-10 NOTE — Progress Notes (Signed)
 Lab Results  Component Value Date   INR 2.7 11/10/2024   INR 2.8 10/20/2024   INR 2.9 09/22/2024    Description   INR 2.7, START taking Warfarin 7.5mg  daily except 3.75mg  on Sundays and Thursdays.  Recheck INR in 3 weeks.  Coumadin  Clinic [phone]

## 2024-12-01 ENCOUNTER — Ambulatory Visit
